# Patient Record
Sex: Male | Born: 1955
Health system: Southern US, Community
[De-identification: ages and names within clinical notes are randomized; demographics above are authoritative.]

## PROBLEM LIST (undated history)

## (undated) DIAGNOSIS — F329 Major depressive disorder, single episode, unspecified: Secondary | ICD-10-CM

## (undated) DIAGNOSIS — E7800 Pure hypercholesterolemia, unspecified: Secondary | ICD-10-CM

## (undated) DIAGNOSIS — F32A Depression, unspecified: Secondary | ICD-10-CM

## (undated) DIAGNOSIS — G4730 Sleep apnea, unspecified: Secondary | ICD-10-CM

## (undated) DIAGNOSIS — N189 Chronic kidney disease, unspecified: Secondary | ICD-10-CM

## (undated) DIAGNOSIS — E119 Type 2 diabetes mellitus without complications: Secondary | ICD-10-CM

## (undated) DIAGNOSIS — C439 Malignant melanoma of skin, unspecified: Secondary | ICD-10-CM

## (undated) DIAGNOSIS — K219 Gastro-esophageal reflux disease without esophagitis: Secondary | ICD-10-CM

## (undated) DIAGNOSIS — I10 Essential (primary) hypertension: Secondary | ICD-10-CM

## (undated) DIAGNOSIS — F419 Anxiety disorder, unspecified: Secondary | ICD-10-CM

## (undated) DIAGNOSIS — Z5189 Encounter for other specified aftercare: Secondary | ICD-10-CM

## (undated) DIAGNOSIS — F319 Bipolar disorder, unspecified: Secondary | ICD-10-CM

## (undated) HISTORY — DX: Depression, unspecified: F32.A

## (undated) HISTORY — DX: Encounter for other specified aftercare: Z51.89

## (undated) HISTORY — DX: Type 2 diabetes mellitus without complications: E11.9

## (undated) HISTORY — PX: SPLENECTOMY: SUR1306

## (undated) HISTORY — DX: Major depressive disorder, single episode, unspecified: F32.9

## (undated) HISTORY — DX: Sleep apnea, unspecified: G47.30

## (undated) HISTORY — DX: Gastro-esophageal reflux disease without esophagitis: K21.9

## (undated) HISTORY — DX: Malignant melanoma of skin, unspecified: C43.9

## (undated) HISTORY — PX: OTHER SURGICAL HISTORY: SHX169

## (undated) HISTORY — DX: Chronic kidney disease, unspecified: N18.9

---

## 1998-01-17 ENCOUNTER — Encounter: Payer: Self-pay | Admitting: Orthopedic Surgery

## 1998-01-17 ENCOUNTER — Ambulatory Visit (HOSPITAL_COMMUNITY): Admission: RE | Admit: 1998-01-17 | Discharge: 1998-01-17 | Payer: Self-pay | Admitting: Orthopedic Surgery

## 1998-07-27 ENCOUNTER — Ambulatory Visit (HOSPITAL_COMMUNITY): Admission: RE | Admit: 1998-07-27 | Discharge: 1998-07-27 | Payer: Self-pay | Admitting: Internal Medicine

## 1998-07-27 ENCOUNTER — Encounter: Payer: Self-pay | Admitting: Internal Medicine

## 1998-12-12 ENCOUNTER — Encounter: Payer: Self-pay | Admitting: *Deleted

## 1998-12-12 ENCOUNTER — Emergency Department (HOSPITAL_COMMUNITY): Admission: EM | Admit: 1998-12-12 | Discharge: 1998-12-12 | Payer: Self-pay | Admitting: *Deleted

## 2000-02-26 ENCOUNTER — Encounter: Payer: Self-pay | Admitting: Internal Medicine

## 2000-02-26 ENCOUNTER — Ambulatory Visit (HOSPITAL_COMMUNITY): Admission: RE | Admit: 2000-02-26 | Discharge: 2000-02-26 | Payer: Self-pay | Admitting: Internal Medicine

## 2001-03-02 ENCOUNTER — Encounter: Payer: Self-pay | Admitting: Internal Medicine

## 2001-03-02 ENCOUNTER — Ambulatory Visit (HOSPITAL_COMMUNITY): Admission: RE | Admit: 2001-03-02 | Discharge: 2001-03-02 | Payer: Self-pay | Admitting: Internal Medicine

## 2002-03-03 ENCOUNTER — Encounter: Payer: Self-pay | Admitting: Internal Medicine

## 2002-03-03 ENCOUNTER — Ambulatory Visit (HOSPITAL_COMMUNITY): Admission: RE | Admit: 2002-03-03 | Discharge: 2002-03-03 | Payer: Self-pay | Admitting: Internal Medicine

## 2003-11-07 ENCOUNTER — Emergency Department (HOSPITAL_COMMUNITY): Admission: EM | Admit: 2003-11-07 | Discharge: 2003-11-07 | Payer: Self-pay | Admitting: Emergency Medicine

## 2004-03-28 ENCOUNTER — Ambulatory Visit (HOSPITAL_COMMUNITY): Admission: RE | Admit: 2004-03-28 | Discharge: 2004-03-28 | Payer: Self-pay | Admitting: Internal Medicine

## 2004-05-09 ENCOUNTER — Ambulatory Visit: Payer: Self-pay | Admitting: Internal Medicine

## 2004-12-05 ENCOUNTER — Emergency Department (HOSPITAL_COMMUNITY): Admission: EM | Admit: 2004-12-05 | Discharge: 2004-12-05 | Payer: Self-pay | Admitting: Emergency Medicine

## 2005-03-20 ENCOUNTER — Emergency Department (HOSPITAL_COMMUNITY): Admission: EM | Admit: 2005-03-20 | Discharge: 2005-03-20 | Payer: Self-pay | Admitting: Emergency Medicine

## 2007-07-02 ENCOUNTER — Ambulatory Visit (HOSPITAL_COMMUNITY): Admission: RE | Admit: 2007-07-02 | Discharge: 2007-07-02 | Payer: Self-pay | Admitting: Internal Medicine

## 2007-10-12 ENCOUNTER — Encounter: Admission: RE | Admit: 2007-10-12 | Discharge: 2007-10-12 | Payer: Self-pay | Admitting: Internal Medicine

## 2009-06-18 ENCOUNTER — Emergency Department (HOSPITAL_COMMUNITY): Admission: EM | Admit: 2009-06-18 | Discharge: 2009-06-19 | Payer: Self-pay | Admitting: Emergency Medicine

## 2009-06-19 ENCOUNTER — Inpatient Hospital Stay (HOSPITAL_COMMUNITY): Admission: AD | Admit: 2009-06-19 | Discharge: 2009-06-27 | Payer: Self-pay | Admitting: Psychiatry

## 2009-06-19 ENCOUNTER — Ambulatory Visit: Payer: Self-pay | Admitting: Psychiatry

## 2009-07-18 ENCOUNTER — Emergency Department (HOSPITAL_COMMUNITY): Admission: EM | Admit: 2009-07-18 | Discharge: 2009-07-18 | Payer: Self-pay | Admitting: Emergency Medicine

## 2009-09-05 ENCOUNTER — Emergency Department (HOSPITAL_COMMUNITY): Admission: EM | Admit: 2009-09-05 | Discharge: 2009-09-05 | Payer: Self-pay | Admitting: Emergency Medicine

## 2009-09-05 ENCOUNTER — Ambulatory Visit: Payer: Self-pay | Admitting: Psychiatry

## 2009-09-05 ENCOUNTER — Inpatient Hospital Stay (HOSPITAL_COMMUNITY): Admission: AD | Admit: 2009-09-05 | Discharge: 2009-09-08 | Payer: Self-pay | Admitting: Psychiatry

## 2009-09-30 ENCOUNTER — Emergency Department (HOSPITAL_COMMUNITY): Admission: EM | Admit: 2009-09-30 | Discharge: 2009-09-30 | Payer: Self-pay | Admitting: Emergency Medicine

## 2009-10-04 ENCOUNTER — Emergency Department (HOSPITAL_COMMUNITY): Admission: EM | Admit: 2009-10-04 | Discharge: 2009-10-04 | Payer: Self-pay | Admitting: Emergency Medicine

## 2009-11-21 ENCOUNTER — Emergency Department (HOSPITAL_COMMUNITY): Admission: EM | Admit: 2009-11-21 | Discharge: 2009-11-21 | Payer: Self-pay | Admitting: Emergency Medicine

## 2010-06-15 LAB — URINALYSIS, ROUTINE W REFLEX MICROSCOPIC
Bilirubin Urine: NEGATIVE
Glucose, UA: NEGATIVE mg/dL
Hgb urine dipstick: NEGATIVE
Ketones, ur: NEGATIVE mg/dL
Nitrite: NEGATIVE
Protein, ur: NEGATIVE mg/dL
Specific Gravity, Urine: 1.008 (ref 1.005–1.030)
Urobilinogen, UA: 1 mg/dL (ref 0.0–1.0)
pH: 6.5 (ref 5.0–8.0)

## 2010-06-15 LAB — BASIC METABOLIC PANEL
BUN: 9 mg/dL (ref 6–23)
CO2: 28 mEq/L (ref 19–32)
Calcium: 9.1 mg/dL (ref 8.4–10.5)
Chloride: 108 mEq/L (ref 96–112)
Creatinine, Ser: 1.2 mg/dL (ref 0.4–1.5)
GFR calc Af Amer: >60 mL/min (ref >60)
GFR calc non Af Amer: >60 mL/min (ref >60)
Glucose, Bld: 86 mg/dL (ref 70–99)
Potassium: 3.6 mEq/L (ref 3.5–5.1)
Sodium: 142 mEq/L (ref 135–145)

## 2010-06-15 LAB — DIFFERENTIAL
Basophils Absolute: 0.1 10*3/uL (ref 0.0–0.1)
Basophils Relative: 1 % (ref 0–1)
Eosinophils Absolute: 0.2 10*3/uL (ref 0.0–0.7)
Eosinophils Relative: 2 % (ref 0–5)
Lymphocytes Relative: 45 % (ref 12–46)
Lymphs Abs: 3.9 10*3/uL (ref 0.7–4.0)
Monocytes Absolute: 1.1 10*3/uL — ABNORMAL HIGH (ref 0.1–1.0)
Monocytes Relative: 13 % — ABNORMAL HIGH (ref 3–12)
Neutro Abs: 3.5 10*3/uL (ref 1.7–7.7)
Neutrophils Relative %: 40 % — ABNORMAL LOW (ref 43–77)

## 2010-06-15 LAB — RAPID URINE DRUG SCREEN, HOSP PERFORMED
Amphetamines: NOT DETECTED
Barbiturates: NOT DETECTED
Benzodiazepines: POSITIVE — AB
Cocaine: NOT DETECTED
Opiates: NOT DETECTED
Tetrahydrocannabinol: NOT DETECTED

## 2010-06-15 LAB — CBC
HCT: 39.5 % (ref 39.0–52.0)
Hemoglobin: 13.8 g/dL (ref 13.0–17.0)
MCH: 34.3 pg — ABNORMAL HIGH (ref 26.0–34.0)
MCHC: 34.9 g/dL (ref 30.0–36.0)
MCV: 98.3 fL (ref 78.0–100.0)
Platelets: 385 10*3/uL (ref 150–400)
RBC: 4.02 MIL/uL — ABNORMAL LOW (ref 4.22–5.81)
RDW: 13 % (ref 11.5–15.5)
WBC: 8.8 10*3/uL (ref 4.0–10.5)

## 2010-06-15 LAB — ETHANOL: Alcohol, Ethyl (B): <5 mg/dL (ref 0–10)

## 2010-06-17 LAB — POCT I-STAT, CHEM 8
BUN: 10 mg/dL (ref 6–23)
Calcium, Ion: 1.16 mmol/L (ref 1.12–1.32)
Chloride: 102 mEq/L (ref 96–112)
Creatinine, Ser: 0.9 mg/dL (ref 0.4–1.5)
Glucose, Bld: 169 mg/dL — ABNORMAL HIGH (ref 70–99)
HCT: 44 % (ref 39.0–52.0)
Hemoglobin: 15 g/dL (ref 13.0–17.0)
Potassium: 3.7 mEq/L (ref 3.5–5.1)
Sodium: 141 mEq/L (ref 135–145)
TCO2: 29 mmol/L (ref 0–100)

## 2010-06-17 LAB — COMPREHENSIVE METABOLIC PANEL
ALT: 17 U/L (ref 0–53)
AST: 25 U/L (ref 0–37)
Albumin: 3.4 g/dL — ABNORMAL LOW (ref 3.5–5.2)
Alkaline Phosphatase: 53 U/L (ref 39–117)
BUN: 12 mg/dL (ref 6–23)
CO2: 28 mEq/L (ref 19–32)
Calcium: 8.4 mg/dL (ref 8.4–10.5)
Chloride: 100 mEq/L (ref 96–112)
Creatinine, Ser: 1.1 mg/dL (ref 0.4–1.5)
GFR calc Af Amer: >60 mL/min (ref >60)
GFR calc non Af Amer: >60 mL/min (ref >60)
Glucose, Bld: 163 mg/dL — ABNORMAL HIGH (ref 70–99)
Potassium: 2.7 mEq/L — CL (ref 3.5–5.1)
Sodium: 138 mEq/L (ref 135–145)
Total Bilirubin: 0.3 mg/dL (ref 0.3–1.2)
Total Protein: 6.4 g/dL (ref 6.0–8.3)

## 2010-06-17 LAB — CBC
HCT: 41.7 % (ref 39.0–52.0)
Hemoglobin: 14.4 g/dL (ref 13.0–17.0)
MCH: 35 pg — ABNORMAL HIGH (ref 26.0–34.0)
MCHC: 34.6 g/dL (ref 30.0–36.0)
MCV: 101.4 fL — ABNORMAL HIGH (ref 78.0–100.0)
Platelets: 303 10*3/uL (ref 150–400)
RBC: 4.12 MIL/uL — ABNORMAL LOW (ref 4.22–5.81)
RDW: 13.5 % (ref 11.5–15.5)
WBC: 10.9 10*3/uL — ABNORMAL HIGH (ref 4.0–10.5)

## 2010-06-17 LAB — DIFFERENTIAL
Basophils Absolute: 0.2 10*3/uL — ABNORMAL HIGH (ref 0.0–0.1)
Basophils Relative: 2 % — ABNORMAL HIGH (ref 0–1)
Eosinophils Absolute: 0.6 10*3/uL (ref 0.0–0.7)
Eosinophils Relative: 5 % (ref 0–5)
Lymphocytes Relative: 40 % (ref 12–46)
Lymphs Abs: 4.3 10*3/uL — ABNORMAL HIGH (ref 0.7–4.0)
Monocytes Absolute: 1.8 10*3/uL — ABNORMAL HIGH (ref 0.1–1.0)
Monocytes Relative: 17 % — ABNORMAL HIGH (ref 3–12)
Neutro Abs: 4 10*3/uL (ref 1.7–7.7)
Neutrophils Relative %: 36 % — ABNORMAL LOW (ref 43–77)

## 2010-06-17 LAB — URINALYSIS, ROUTINE W REFLEX MICROSCOPIC
Glucose, UA: NEGATIVE mg/dL
Hgb urine dipstick: NEGATIVE
Ketones, ur: 15 mg/dL — AB
Nitrite: NEGATIVE
Protein, ur: NEGATIVE mg/dL
Specific Gravity, Urine: 1.022 (ref 1.005–1.030)
Urobilinogen, UA: 1 mg/dL (ref 0.0–1.0)
pH: 5.5 (ref 5.0–8.0)

## 2010-06-18 LAB — COMPREHENSIVE METABOLIC PANEL
ALT: 24 U/L (ref 0–53)
ALT: 27 U/L (ref 0–53)
AST: 22 U/L (ref 0–37)
AST: 25 U/L (ref 0–37)
Albumin: 3.9 g/dL (ref 3.5–5.2)
Albumin: 4.2 g/dL (ref 3.5–5.2)
Alkaline Phosphatase: 50 U/L (ref 39–117)
Alkaline Phosphatase: 51 U/L (ref 39–117)
BUN: 10 mg/dL (ref 6–23)
BUN: 11 mg/dL (ref 6–23)
CO2: 27 mEq/L (ref 19–32)
CO2: 29 mEq/L (ref 19–32)
Calcium: 9.2 mg/dL (ref 8.4–10.5)
Calcium: 9.2 mg/dL (ref 8.4–10.5)
Chloride: 105 mEq/L (ref 96–112)
Chloride: 106 mEq/L (ref 96–112)
Creatinine, Ser: 1.06 mg/dL (ref 0.4–1.5)
Creatinine, Ser: 1.09 mg/dL (ref 0.4–1.5)
GFR calc Af Amer: >60 mL/min (ref >60)
GFR calc Af Amer: >60 mL/min (ref >60)
GFR calc non Af Amer: >60 mL/min (ref >60)
GFR calc non Af Amer: >60 mL/min (ref >60)
Glucose, Bld: 114 mg/dL — ABNORMAL HIGH (ref 70–99)
Glucose, Bld: 123 mg/dL — ABNORMAL HIGH (ref 70–99)
Potassium: 3.3 mEq/L — ABNORMAL LOW (ref 3.5–5.1)
Potassium: 3.9 mEq/L (ref 3.5–5.1)
Sodium: 140 mEq/L (ref 135–145)
Sodium: 143 mEq/L (ref 135–145)
Total Bilirubin: 0.7 mg/dL (ref 0.3–1.2)
Total Bilirubin: 0.9 mg/dL (ref 0.3–1.2)
Total Protein: 7 g/dL (ref 6.0–8.3)
Total Protein: 7.2 g/dL (ref 6.0–8.3)

## 2010-06-18 LAB — CBC
HCT: 41.3 % (ref 39.0–52.0)
HCT: 45 % (ref 39.0–52.0)
Hemoglobin: 14.3 g/dL (ref 13.0–17.0)
Hemoglobin: 15.2 g/dL (ref 13.0–17.0)
MCHC: 33.8 g/dL (ref 30.0–36.0)
MCHC: 34.7 g/dL (ref 30.0–36.0)
MCV: 100.7 fL — ABNORMAL HIGH (ref 78.0–100.0)
MCV: 99.7 fL (ref 78.0–100.0)
Platelets: 316 10*3/uL (ref 150–400)
Platelets: 355 10*3/uL (ref 150–400)
RBC: 4.14 MIL/uL — ABNORMAL LOW (ref 4.22–5.81)
RBC: 4.47 MIL/uL (ref 4.22–5.81)
RDW: 13.6 % (ref 11.5–15.5)
RDW: 13.7 % (ref 11.5–15.5)
WBC: 11.8 10*3/uL — ABNORMAL HIGH (ref 4.0–10.5)
WBC: 14.1 10*3/uL — ABNORMAL HIGH (ref 4.0–10.5)

## 2010-06-18 LAB — RAPID URINE DRUG SCREEN, HOSP PERFORMED
Amphetamines: NOT DETECTED
Barbiturates: NOT DETECTED
Benzodiazepines: POSITIVE — AB
Cocaine: NOT DETECTED
Opiates: NOT DETECTED
Tetrahydrocannabinol: NOT DETECTED

## 2010-06-18 LAB — BASIC METABOLIC PANEL
BUN: 9 mg/dL (ref 6–23)
CO2: 27 mEq/L (ref 19–32)
Calcium: 8.9 mg/dL (ref 8.4–10.5)
Chloride: 104 mEq/L (ref 96–112)
Creatinine, Ser: 1.09 mg/dL (ref 0.4–1.5)
GFR calc Af Amer: >60 mL/min (ref >60)
GFR calc non Af Amer: >60 mL/min (ref >60)
Glucose, Bld: 159 mg/dL — ABNORMAL HIGH (ref 70–99)
Potassium: 3.5 mEq/L (ref 3.5–5.1)
Sodium: 139 mEq/L (ref 135–145)

## 2010-06-18 LAB — DIFFERENTIAL
Basophils Absolute: 0.1 10*3/uL (ref 0.0–0.1)
Basophils Relative: 1 % (ref 0–1)
Eosinophils Absolute: 0.6 10*3/uL (ref 0.0–0.7)
Eosinophils Relative: 5 % (ref 0–5)
Lymphocytes Relative: 44 % (ref 12–46)
Lymphs Abs: 5.2 10*3/uL — ABNORMAL HIGH (ref 0.7–4.0)
Monocytes Absolute: 1.6 10*3/uL — ABNORMAL HIGH (ref 0.1–1.0)
Monocytes Relative: 13 % — ABNORMAL HIGH (ref 3–12)
Neutro Abs: 4.4 10*3/uL (ref 1.7–7.7)
Neutrophils Relative %: 37 % — ABNORMAL LOW (ref 43–77)

## 2010-06-18 LAB — ETHANOL: Alcohol, Ethyl (B): <5 mg/dL (ref 0–10)

## 2010-06-18 LAB — VALPROIC ACID LEVEL
Valproic Acid Lvl: 13.3 ug/mL — ABNORMAL LOW (ref 50.0–100.0)
Valproic Acid Lvl: 46.8 ug/mL — ABNORMAL LOW (ref 50.0–100.0)

## 2010-06-18 LAB — TRICYCLICS SCREEN, URINE: TCA Scrn: NOT DETECTED

## 2010-06-24 LAB — RAPID URINE DRUG SCREEN, HOSP PERFORMED
Amphetamines: NOT DETECTED
Barbiturates: NOT DETECTED
Benzodiazepines: POSITIVE — AB
Cocaine: NOT DETECTED
Opiates: NOT DETECTED
Tetrahydrocannabinol: NOT DETECTED

## 2010-06-24 LAB — POCT I-STAT, CHEM 8
BUN: 14 mg/dL (ref 6–23)
Calcium, Ion: 1.16 mmol/L (ref 1.12–1.32)
Chloride: 101 meq/L (ref 96–112)
Creatinine, Ser: 1.1 mg/dL (ref 0.4–1.5)
Glucose, Bld: 111 mg/dL — ABNORMAL HIGH (ref 70–99)
HCT: 41 % (ref 39.0–52.0)
Hemoglobin: 13.9 g/dL (ref 13.0–17.0)
Potassium: 3 meq/L — ABNORMAL LOW (ref 3.5–5.1)
Sodium: 141 meq/L (ref 135–145)
TCO2: 32 mmol/L (ref 0–100)

## 2010-06-24 LAB — POTASSIUM: Potassium: 3.4 mEq/L — ABNORMAL LOW (ref 3.5–5.1)

## 2010-06-24 LAB — ETHANOL

## 2010-09-09 ENCOUNTER — Emergency Department (HOSPITAL_COMMUNITY): Payer: BC Managed Care – PPO

## 2010-09-09 ENCOUNTER — Emergency Department (HOSPITAL_COMMUNITY)
Admission: EM | Admit: 2010-09-09 | Discharge: 2010-09-09 | Disposition: A | Discharge status: Home / Self Care | Payer: BC Managed Care – PPO | Attending: Internal Medicine | Admitting: Internal Medicine

## 2010-09-09 DIAGNOSIS — Z79899 Other long term (current) drug therapy: Secondary | ICD-10-CM | POA: Insufficient documentation

## 2010-09-09 DIAGNOSIS — I10 Essential (primary) hypertension: Secondary | ICD-10-CM | POA: Insufficient documentation

## 2010-09-09 DIAGNOSIS — R630 Anorexia: Secondary | ICD-10-CM | POA: Insufficient documentation

## 2010-09-09 DIAGNOSIS — F341 Dysthymic disorder: Secondary | ICD-10-CM | POA: Insufficient documentation

## 2010-09-09 DIAGNOSIS — E785 Hyperlipidemia, unspecified: Secondary | ICD-10-CM | POA: Insufficient documentation

## 2010-09-09 DIAGNOSIS — Z7982 Long term (current) use of aspirin: Secondary | ICD-10-CM | POA: Insufficient documentation

## 2010-09-09 DIAGNOSIS — R634 Abnormal weight loss: Secondary | ICD-10-CM | POA: Insufficient documentation

## 2010-09-09 LAB — CBC
HCT: 39 % (ref 39.0–52.0)
Hemoglobin: 13.5 g/dL (ref 13.0–17.0)
MCH: 32.5 pg (ref 26.0–34.0)
MCHC: 34.6 g/dL (ref 30.0–36.0)
MCV: 94 fL (ref 78.0–100.0)
Platelets: 340 10*3/uL (ref 150–400)
RBC: 4.15 MIL/uL — ABNORMAL LOW (ref 4.22–5.81)
RDW: 12.7 % (ref 11.5–15.5)
WBC: 11.3 10*3/uL — ABNORMAL HIGH (ref 4.0–10.5)

## 2010-09-09 LAB — DIFFERENTIAL
Basophils Absolute: 0.1 10*3/uL (ref 0.0–0.1)
Basophils Relative: 1 % (ref 0–1)
Eosinophils Absolute: 0.3 10*3/uL (ref 0.0–0.7)
Eosinophils Relative: 3 % (ref 0–5)
Lymphocytes Relative: 54 % — ABNORMAL HIGH (ref 12–46)
Lymphs Abs: 6.1 10*3/uL — ABNORMAL HIGH (ref 0.7–4.0)
Monocytes Absolute: 1.5 10*3/uL — ABNORMAL HIGH (ref 0.1–1.0)
Monocytes Relative: 13 % — ABNORMAL HIGH (ref 3–12)
Neutro Abs: 3.3 10*3/uL (ref 1.7–7.7)
Neutrophils Relative %: 30 % — ABNORMAL LOW (ref 43–77)

## 2010-09-09 LAB — POCT I-STAT, CHEM 8
BUN: 20 mg/dL (ref 6–23)
Calcium, Ion: 1.16 mmol/L (ref 1.12–1.32)
Chloride: 104 mEq/L (ref 96–112)
Creatinine, Ser: 1.2 mg/dL (ref 0.4–1.5)
Glucose, Bld: 88 mg/dL (ref 70–99)
HCT: 41 % (ref 39.0–52.0)
Hemoglobin: 13.9 g/dL (ref 13.0–17.0)
Potassium: 3.7 mEq/L (ref 3.5–5.1)
Sodium: 142 mEq/L (ref 135–145)
TCO2: 28 mmol/L (ref 0–100)

## 2010-09-09 MED ORDER — IOHEXOL 300 MG/ML  SOLN: 75.0000 mL | Freq: Once | INTRAMUSCULAR | Status: DC | PRN

## 2010-09-10 LAB — PATHOLOGIST SMEAR REVIEW

## 2011-04-06 ENCOUNTER — Emergency Department (HOSPITAL_COMMUNITY)
Admission: EM | Admit: 2011-04-06 | Discharge: 2011-04-07 | Disposition: A | Discharge status: Home / Self Care | Payer: BC Managed Care – PPO | Attending: Emergency Medicine | Admitting: Emergency Medicine

## 2011-04-06 ENCOUNTER — Encounter: Payer: Self-pay | Admitting: *Deleted

## 2011-04-06 DIAGNOSIS — IMO0002 Reserved for concepts with insufficient information to code with codable children: Secondary | ICD-10-CM | POA: Insufficient documentation

## 2011-04-06 DIAGNOSIS — F3289 Other specified depressive episodes: Secondary | ICD-10-CM | POA: Insufficient documentation

## 2011-04-06 DIAGNOSIS — F329 Major depressive disorder, single episode, unspecified: Secondary | ICD-10-CM | POA: Insufficient documentation

## 2011-04-06 DIAGNOSIS — F32A Depression, unspecified: Secondary | ICD-10-CM

## 2011-04-06 DIAGNOSIS — F411 Generalized anxiety disorder: Secondary | ICD-10-CM | POA: Insufficient documentation

## 2011-04-06 DIAGNOSIS — R443 Hallucinations, unspecified: Secondary | ICD-10-CM | POA: Insufficient documentation

## 2011-04-06 DIAGNOSIS — I10 Essential (primary) hypertension: Secondary | ICD-10-CM | POA: Insufficient documentation

## 2011-04-06 HISTORY — DX: Pure hypercholesterolemia, unspecified: E78.00

## 2011-04-06 HISTORY — DX: Essential (primary) hypertension: I10

## 2011-04-06 HISTORY — DX: Major depressive disorder, single episode, unspecified: F32.9

## 2011-04-06 HISTORY — DX: Depression, unspecified: F32.A

## 2011-04-06 HISTORY — DX: Anxiety disorder, unspecified: F41.9

## 2011-04-06 LAB — ETHANOL: Alcohol, Ethyl (B): <11 mg/dL (ref 0–11)

## 2011-04-06 LAB — CBC
HCT: 44 % (ref 39.0–52.0)
Hemoglobin: 14.8 g/dL (ref 13.0–17.0)
MCH: 32.2 pg (ref 26.0–34.0)
MCHC: 33.6 g/dL (ref 30.0–36.0)
MCV: 95.7 fL (ref 78.0–100.0)
Platelets: 394 10*3/uL (ref 150–400)
RBC: 4.6 MIL/uL (ref 4.22–5.81)
RDW: 12.8 % (ref 11.5–15.5)
WBC: 10.5 10*3/uL (ref 4.0–10.5)

## 2011-04-06 LAB — RAPID URINE DRUG SCREEN, HOSP PERFORMED
Amphetamines: NOT DETECTED
Barbiturates: NOT DETECTED
Benzodiazepines: POSITIVE — AB
Cocaine: NOT DETECTED
Opiates: NOT DETECTED
Tetrahydrocannabinol: NOT DETECTED

## 2011-04-06 LAB — BASIC METABOLIC PANEL
BUN: 7 mg/dL (ref 6–23)
CO2: 27 mEq/L (ref 19–32)
Calcium: 9.7 mg/dL (ref 8.4–10.5)
Chloride: 104 mEq/L (ref 96–112)
Creatinine, Ser: 0.94 mg/dL (ref 0.50–1.35)
GFR calc Af Amer: >90 mL/min (ref >90)
GFR calc non Af Amer: >90 mL/min (ref >90)
Glucose, Bld: 108 mg/dL — ABNORMAL HIGH (ref 70–99)
Potassium: 3.1 mEq/L — ABNORMAL LOW (ref 3.5–5.1)
Sodium: 143 mEq/L (ref 135–145)

## 2011-04-06 MED ORDER — ALUM & MAG HYDROXIDE-SIMETH 200-200-20 MG/5ML PO SUSP: 30.0000 mL | ORAL | Status: DC | PRN

## 2011-04-06 MED ORDER — LORAZEPAM 1 MG PO TABS
1.0000 mg | ORAL_TABLET | Freq: Three times a day (TID) | ORAL | Status: DC | PRN
  Administered 2011-04-06 – 2011-04-07 (×2): 1 mg via ORAL
  Filled 2011-04-06 (×2): qty 1

## 2011-04-06 MED ORDER — FUROSEMIDE 40 MG PO TABS
40.0000 mg | ORAL_TABLET | Freq: Two times a day (BID) | ORAL | Status: DC
  Administered 2011-04-06 – 2011-04-07 (×3): 40 mg via ORAL
  Filled 2011-04-06 (×6): qty 1

## 2011-04-06 MED ORDER — ZOLPIDEM TARTRATE 5 MG PO TABS
5.0000 mg | ORAL_TABLET | Freq: Every evening | ORAL | Status: DC | PRN
  Administered 2011-04-07: 5 mg via ORAL
  Filled 2011-04-06: qty 1

## 2011-04-06 MED ORDER — ACETAMINOPHEN 325 MG PO TABS: 650.0000 mg | ORAL_TABLET | ORAL | Status: DC | PRN

## 2011-04-06 MED ORDER — ONDANSETRON HCL 4 MG PO TABS: 4.0000 mg | ORAL_TABLET | Freq: Three times a day (TID) | ORAL | Status: DC | PRN

## 2011-04-06 MED ORDER — CLORAZEPATE DIPOTASSIUM 7.5 MG PO TABS
7.5000 mg | ORAL_TABLET | Freq: Three times a day (TID) | ORAL | Status: DC
  Administered 2011-04-06 – 2011-04-07 (×4): 7.5 mg via ORAL
  Filled 2011-04-06 (×4): qty 1

## 2011-04-06 MED ORDER — ATENOLOL 50 MG PO TABS
50.0000 mg | ORAL_TABLET | Freq: Every day | ORAL | Status: DC
  Administered 2011-04-06 – 2011-04-07 (×2): 50 mg via ORAL
  Filled 2011-04-06 (×3): qty 1

## 2011-04-06 MED ORDER — IBUPROFEN 600 MG PO TABS: 600.0000 mg | ORAL_TABLET | Freq: Three times a day (TID) | ORAL | Status: DC | PRN

## 2011-04-06 NOTE — ED Notes (Signed)
Patient reports depression, paranoia, anxiety, increased stress, fleeting suicidal thoughts but no plan and has threatened other people.

## 2011-04-06 NOTE — ED Notes (Signed)
Pt. Eating from dinner tray---Continues to appear less anxious

## 2011-04-06 NOTE — ED Notes (Signed)
Wife at bedside talking with patient--Remains calm and talkative.  Ate 100% from dinner tray and has consumed several snacks.
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
## 2011-04-06 NOTE — ED Notes (Signed)
Pt. Appears less anxious--states he does think the Ativan tablet did decrease his nervousness.  Eating sandwich and snack--appears to be in no distress.

## 2011-04-06 NOTE — ED Notes (Signed)
Report received---Pt. Transported to TCU Rm 29 in direct view of nurses' station--Pt. Very talkative---actually talking continuously about various subjects including his mother and her mental illness, states he has not been sleeping well and has been unable to focus for the past few weeks.  Allowed pt. To vent his concerns.

## 2011-04-06 NOTE — ED Notes (Signed)
Security called to wand pt and pt's personal belongings. 

## 2011-04-06 NOTE — ED Provider Notes (Signed)
Pt presents to the hospital with the chief complaint of depression. Pt has been suffering from depression for 35 years, however recently it has gotten much worse & it is interfering with pts daily living. Pt has has SI, but denies HI. Pt denies any cardiac &respiratory s/s including CP, SOB, DOE, cough, leg swelling, or palpitations. Pt also denies urinary s/s of hematuria, dysuria, frequency and urgency. Pt currently has no medical complaints is hemodynamically stable and does not appear to be in acute distress  Physical Exam  Constitutional: He is oriented to person, place, and time. He appears well-developed and well-nourished. No distress.  Eyes: Conjunctivae and EOM are normal. Pupils are equal, round, and reactive to light.  Neck: Normal range of motion. Neck supple.  Cardiovascular: Normal rate, regular rhythm and intact distal pulses.   Pulmonary/Chest: Effort normal and breath sounds normal.  Musculoskeletal: Normal range of motion.  Neurological: He is alert and oriented to person, place, and time.  Skin: Skin is warm and dry. He is not diaphoretic.  Psychiatric: His mood appears anxious. His affect is not angry. He is agitated and actively hallucinating (states occasional visual hallucinations). Thought content is paranoid. Thought content is not delusional. Cognition and memory are not impaired. He exhibits a depressed mood. He expresses suicidal ideation. He expresses no homicidal (wife states she gets scared he is going to asttack her at times) ideation. He expresses no suicidal plans and no homicidal plans.       Wife states pt has worsened recently, talks to himself, ruminates, & constantly counts things.     Pt has been examined and medically cleared to move to [facility]. ACT has been paged for consult, holding orders placed, & necessary labs ordered.     [place], [place] 04/06/11 1529

## 2011-04-06 NOTE — ED Notes (Signed)
Pt has visitor at bedside. Visitor belongings left at desk

## 2011-04-06 NOTE — ED Notes (Signed)
Report called to Olegario Messier, RN in [place], pt assisted to rm 29 with chart and personal belongings, condition stable at this time.

## 2011-04-07 MED ORDER — RISPERIDONE 0.5 MG PO TABS: 0.5000 mg | ORAL_TABLET | Freq: Two times a day (BID) | ORAL | Status: DC

## 2011-04-07 MED ORDER — ZOLPIDEM TARTRATE 5 MG PO TABS: 5.0000 mg | ORAL_TABLET | Freq: Every evening | ORAL | Status: DC | PRN

## 2011-04-07 NOTE — ED Notes (Signed)
Dc instructions reviews w/ pt and wife.  Phone numbers for local psyciatrisit that the pt is familiar with given to pt.  Pt encouraged to take the medications as directed and follow up as soon as possible w/ the psychiatrist.  Pt encouraged to return for as difficulties, return of suicidal thoughts/urges.  Pt verbalized understanding

## 2011-04-07 NOTE — ED Provider Notes (Signed)
History     CSN: [id number]  Arrival date & time 04/06/11  1349   First MD Initiated Contact with Patient 04/06/11 1504      Chief Complaint  Patient presents with  . V70.1    (Consider location/radiation/quality/duration/timing/severity/associated sxs/prior treatment) HPI  Past Medical History  Diagnosis Date  . Hypertension   . Anxiety and depression   . Hypercholesterolemia     Past Surgical History  Procedure Date  . L hand surgery   . Splenectomy   . R knee arthoroscopy     Family History  Problem Relation Age of Onset  . Cancer Mother     History  Substance Use Topics  . Smoking status: Former Smoker    Quit date: 04/05/2001  . Smokeless tobacco: Never Used  . Alcohol Use: No      Review of Systems  Allergies  Review of patient's allergies indicates no known allergies.  Home Medications   Current Outpatient Rx  Name Route Sig Dispense Refill  . ATENOLOL 50 MG PO TABS Oral Take 50 mg by mouth daily.      Marland Kitchen CLORAZEPATE DIPOTASSIUM 7.5 MG PO TABS Oral Take 7.5 mg by mouth 3 (three) times daily.      . FUROSEMIDE 40 MG PO TABS Oral Take 40 mg by mouth 2 (two) times daily.      . IBUPROFEN 200 MG PO TABS Oral Take 400 mg by mouth every 8 (eight) hours as needed. For pain.     . ADULT MULTIVITAMIN W/MINERALS CH Oral Take 1 tablet by mouth daily.      Marland Kitchen VISINE OP Ophthalmic Apply 1 drop to eye daily as needed. For dryness.     Marland Kitchen RISPERIDONE 0.5 MG PO TABS Oral Take 1 tablet (0.5 mg total) by mouth 2 (two) times daily. 40 tablet 0  . ZOLPIDEM TARTRATE 5 MG PO TABS Oral Take 1 tablet (5 mg total) by mouth at bedtime as needed for sleep. 15 tablet 0    BP 121/68  Pulse 59  Temp(Src) 98.3 F (36.8 C) (Oral)  Resp 18  SpO2 94%  Physical Exam  ED Course  Procedures (including critical care time)  Labs Reviewed  URINE RAPID DRUG SCREEN (HOSP PERFORMED) - Abnormal; Notable for the following:    Benzodiazepines POSITIVE (*)    All other components  within normal limits  BASIC METABOLIC PANEL - Abnormal; Notable for the following:    Potassium 3.1 (*)    Glucose, Bld 108 (*)    All other components within normal limits  CBC  ETHANOL   No results found.   1. Depression       MDM  tele psych consult completed.  No homicidal or suicidal ideation. Psychiatrist thinks patient can not home.  Discharge home with Risperdal and Ambien        Donnetta Hutching, MD 04/07/11 1752

## 2011-04-07 NOTE — ED Provider Notes (Signed)
Medical screening examination/treatment/procedure(s) were performed by non-physician practitioner and as supervising physician I was immediately available for consultation/collaboration.  Jeffrey P Caporossi, MD 04/07/11 0025 

## 2011-04-07 NOTE — ED Notes (Signed)
Up to the bathroom 

## 2011-04-07 NOTE — ED Notes (Signed)
Belongings returned

## 2011-04-07 NOTE — ED Notes (Addendum)
telepsych in progress, wife at bedside

## 2011-04-07 NOTE — ED Notes (Signed)
Wife at bedside, telepsych procedure explained to both

## 2011-04-07 NOTE — ED Notes (Signed)
telepsych complete 

## 2011-04-07 NOTE — ED Notes (Signed)
Information faxed to telepsych

## 2011-09-11 ENCOUNTER — Encounter (HOSPITAL_COMMUNITY): Payer: Self-pay | Admitting: *Deleted

## 2011-09-11 ENCOUNTER — Emergency Department (HOSPITAL_COMMUNITY)
Admission: EM | Admit: 2011-09-11 | Discharge: 2011-09-11 | Disposition: A | Discharge status: Home / Self Care | Payer: BC Managed Care – PPO | Attending: Emergency Medicine | Admitting: Emergency Medicine

## 2011-09-11 DIAGNOSIS — R609 Edema, unspecified: Secondary | ICD-10-CM | POA: Insufficient documentation

## 2011-09-11 DIAGNOSIS — F329 Major depressive disorder, single episode, unspecified: Secondary | ICD-10-CM | POA: Insufficient documentation

## 2011-09-11 DIAGNOSIS — Z79899 Other long term (current) drug therapy: Secondary | ICD-10-CM | POA: Insufficient documentation

## 2011-09-11 DIAGNOSIS — F3289 Other specified depressive episodes: Secondary | ICD-10-CM | POA: Insufficient documentation

## 2011-09-11 DIAGNOSIS — IMO0002 Reserved for concepts with insufficient information to code with codable children: Secondary | ICD-10-CM | POA: Insufficient documentation

## 2011-09-11 DIAGNOSIS — I10 Essential (primary) hypertension: Secondary | ICD-10-CM | POA: Insufficient documentation

## 2011-09-11 DIAGNOSIS — F32A Depression, unspecified: Secondary | ICD-10-CM

## 2011-09-11 DIAGNOSIS — R45851 Suicidal ideations: Secondary | ICD-10-CM

## 2011-09-11 HISTORY — DX: Bipolar disorder, unspecified: F31.9

## 2011-09-11 LAB — CBC
HCT: 48.1 % (ref 39.0–52.0)
Hemoglobin: 15.7 g/dL (ref 13.0–17.0)
MCH: 32 pg (ref 26.0–34.0)
MCHC: 32.6 g/dL (ref 30.0–36.0)
MCV: 98.2 fL (ref 78.0–100.0)
Platelets: 453 10*3/uL — ABNORMAL HIGH (ref 150–400)
RBC: 4.9 MIL/uL (ref 4.22–5.81)
RDW: 13.3 % (ref 11.5–15.5)
WBC: 11.2 10*3/uL — ABNORMAL HIGH (ref 4.0–10.5)

## 2011-09-11 LAB — COMPREHENSIVE METABOLIC PANEL
ALT: 17 U/L (ref 0–53)
AST: 14 U/L (ref 0–37)
Albumin: 4 g/dL (ref 3.5–5.2)
Alkaline Phosphatase: 80 U/L (ref 39–117)
BUN: 16 mg/dL (ref 6–23)
CO2: 25 mEq/L (ref 19–32)
Calcium: 9.4 mg/dL (ref 8.4–10.5)
Chloride: 100 mEq/L (ref 96–112)
Creatinine, Ser: 1.08 mg/dL (ref 0.50–1.35)
GFR calc Af Amer: 87 mL/min — ABNORMAL LOW (ref >90)
GFR calc non Af Amer: 75 mL/min — ABNORMAL LOW (ref >90)
Glucose, Bld: 102 mg/dL — ABNORMAL HIGH (ref 70–99)
Potassium: 3.5 mEq/L (ref 3.5–5.1)
Sodium: 138 mEq/L (ref 135–145)
Total Bilirubin: 0.5 mg/dL (ref 0.3–1.2)
Total Protein: 7.7 g/dL (ref 6.0–8.3)

## 2011-09-11 LAB — RAPID URINE DRUG SCREEN, HOSP PERFORMED
Amphetamines: NOT DETECTED
Barbiturates: NOT DETECTED
Benzodiazepines: POSITIVE — AB
Cocaine: NOT DETECTED
Opiates: NOT DETECTED
Tetrahydrocannabinol: NOT DETECTED

## 2011-09-11 LAB — ETHANOL: Alcohol, Ethyl (B): <11 mg/dL (ref 0–11)

## 2011-09-11 MED ORDER — ALUM & MAG HYDROXIDE-SIMETH 200-200-20 MG/5ML PO SUSP: 30.0000 mL | ORAL | Status: DC | PRN

## 2011-09-11 MED ORDER — ATENOLOL 50 MG PO TABS
50.0000 mg | ORAL_TABLET | Freq: Every day | ORAL | Status: DC
  Administered 2011-09-11: 50 mg via ORAL
  Filled 2011-09-11: qty 1

## 2011-09-11 MED ORDER — VITAMIN D3 25 MCG (1000 UNIT) PO TABS
1000.0000 [IU] | ORAL_TABLET | Freq: Every day | ORAL | Status: DC
  Administered 2011-09-11: 1000 [IU] via ORAL
  Filled 2011-09-11: qty 1

## 2011-09-11 MED ORDER — FUROSEMIDE 40 MG PO TABS
40.0000 mg | ORAL_TABLET | Freq: Two times a day (BID) | ORAL | Status: DC
Start: 2011-09-11 — End: 2011-09-11
  Administered 2011-09-11: 40 mg via ORAL
  Filled 2011-09-11 (×3): qty 1

## 2011-09-11 MED ORDER — CLORAZEPATE DIPOTASSIUM 7.5 MG PO TABS
7.5000 mg | ORAL_TABLET | Freq: Three times a day (TID) | ORAL | Status: DC
  Administered 2011-09-11 (×2): 7.5 mg via ORAL
  Filled 2011-09-11 (×2): qty 1

## 2011-09-11 MED ORDER — ASPIRIN 81 MG PO CHEW
81.0000 mg | CHEWABLE_TABLET | Freq: Every day | ORAL | Status: DC
  Administered 2011-09-11: 81 mg via ORAL
  Filled 2011-09-11: qty 1

## 2011-09-11 MED ORDER — POTASSIUM CHLORIDE CRYS ER 20 MEQ PO TBCR
20.0000 meq | EXTENDED_RELEASE_TABLET | Freq: Two times a day (BID) | ORAL | Status: DC
  Administered 2011-09-11: 20 meq via ORAL
  Filled 2011-09-11: qty 1

## 2011-09-11 MED ORDER — ACETAMINOPHEN 325 MG PO TABS: 650.0000 mg | ORAL_TABLET | ORAL | Status: DC | PRN

## 2011-09-11 MED ORDER — ADULT MULTIVITAMIN W/MINERALS CH
1.0000 | ORAL_TABLET | Freq: Every day | ORAL | Status: DC
  Administered 2011-09-11: 1 via ORAL
  Filled 2011-09-11: qty 1

## 2011-09-11 MED ORDER — SIMVASTATIN 20 MG PO TABS
20.0000 mg | ORAL_TABLET | Freq: Every day | ORAL | Status: DC
  Administered 2011-09-11: 20 mg via ORAL
  Filled 2011-09-11: qty 1

## 2011-09-11 MED ORDER — SERTRALINE HCL 25 MG PO TABS: 25.0000 mg | ORAL_TABLET | Freq: Every day | ORAL | Status: DC

## 2011-09-11 MED ORDER — LORAZEPAM 1 MG PO TABS: 1.0000 mg | ORAL_TABLET | Freq: Three times a day (TID) | ORAL | Status: DC | PRN

## 2011-09-11 MED ORDER — RISPERIDONE 0.5 MG PO TABS
0.5000 mg | ORAL_TABLET | Freq: Two times a day (BID) | ORAL | Status: DC
  Administered 2011-09-11: 0.5 mg via ORAL
  Filled 2011-09-11: qty 1

## 2011-09-11 MED ORDER — QUETIAPINE FUMARATE 200 MG PO TABS: 200.0000 mg | ORAL_TABLET | Freq: Every day | ORAL | Status: DC

## 2011-09-11 MED ORDER — ZOLPIDEM TARTRATE 5 MG PO TABS: 5.0000 mg | ORAL_TABLET | Freq: Every evening | ORAL | Status: DC | PRN

## 2011-09-11 MED ORDER — IBUPROFEN 200 MG PO TABS: 600.0000 mg | ORAL_TABLET | Freq: Three times a day (TID) | ORAL | Status: DC | PRN

## 2011-09-11 NOTE — ED Notes (Signed)
Pt c/o severe depression; feeling suicidal with different plans; increased stress; can't focus or concentrate; increased in last month; swelling noted bilateral lower legs x 1 month; feels like mind going to explode

## 2011-09-11 NOTE — Clinical Social Work Psych Note (Signed)
Patient has been assessed. Pending telepsych.  Manson Passey Uzoma Vivona ANN S , MSW, LCSWA 09/11/2011 9:34 AM [phone]

## 2011-09-11 NOTE — ED Notes (Signed)
Pt verbalized hopelessness, states he wants to kill self due to a lot of "stressors" like "my finances and health".  Has mentioned several plans to kill self--- jumping in front of a moving truck, hang self in a tree, shooting himself in the head with a gun--- states he has a gun at home. Pt also reports unable to get enough sleep because of depression--- pt states that he takes Tranxene,  Risperdal and Depakote regularly.

## 2011-09-11 NOTE — ED Notes (Signed)
Pt wanded by security and clothing/belongings removed in triage

## 2011-09-11 NOTE — ED Notes (Signed)
Personal belongings placed in plastic bag and locked in cabinet ([id number]).

## 2011-09-11 NOTE — ED Notes (Signed)
Telepsych in progress. 

## 2011-09-11 NOTE — ED Notes (Signed)
Security called to help escort patient to waiting room due to patient refusing to leave after being told 20 minutes earlier. Patient stated "I need to stay here until my ride comes to get me at 2 am". Patient told that he will not be able to stay in the room for that amount of time. Patient given bus pass and instructions on how to ride the bus home. Patient states "that he does not want to ride the bus and will wait in the waiting room" Patient still dressed in blue scrubs and red socks with belongings at side in waiting room because patient refused to change into clothes.
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
## 2011-09-11 NOTE — ED Notes (Signed)
Patient wants to drink coffee then take shower. Materials at bedside for shower.

## 2011-09-11 NOTE — Discharge Instructions (Signed)
Depression You have signs of depression. This is a common problem. It can occur at any age. It is often hard to recognize. People can suffer from depression and still have moments of enjoyment. Depression interferes with your basic ability to function in life. It upsets your relationships, sleep, eating, and work habits. CAUSES  Depression is believed to be caused by an imbalance in brain chemicals. It may be triggered by an unpleasant event. Relationship crises, a death in the family, financial worries, retirement, or other stressors are normal causes of depression. Depression may also start for no known reason. Other factors that may play a part include medical illnesses, some medicines, genetics, and alcohol or drug abuse. SYMPTOMS   Feeling unhappy or worthless.   Long-lasting (chronic) tiredness or worn-out feeling.   Self-destructive thoughts and actions.   Not being able to sleep or sleeping too much.   Eating more than usual or not eating at all.   Headaches or feeling anxious.   Trouble concentrating or making decisions.   Unexplained physical problems and substance abuse.  TREATMENT  Depression usually gets better with treatment. This can include:  Antidepressant medicines. It can take weeks before the proper dose is achieved and benefits are reached.   Talking with a therapist, clergyperson, counselor, or friend. These people can help you gain insight into your problem and regain control of your life.   Eating a good diet.   Getting regular physical exercise, such as walking for 30 minutes every day.   Not abusing alcohol or drugs.  Treating depression often takes 6 months or longer. This length of treatment is needed to keep symptoms from returning. Call your caregiver and arrange for follow-up care as suggested. SEEK IMMEDIATE MEDICAL CARE IF:   You start to have thoughts of hurting yourself or others.   Call your local emergency services (911 in [place]   Go to  your local medical emergency department.   Call the National Suicide Prevention Lifeline: 1-800-273-TALK 317-021-8915).  Document Released: 03/18/2005 Document Revised: 03/07/2011 Document Reviewed: 08/18/2009 [facility] Patient Information 2012 [place], [place].Suicidal Feelings, How to Help Yourself Everyone feels sad or unhappy at times, but depressing thoughts and feelings of hopelessness can lead to thoughts of suicide. It can seem as if life is too tough to handle. It is as if the mountain is just too high and your climbing skills are not great enough. At that moment these dark thoughts and feelings may seem overwhelming and never ending. It is important to remember these feelings are temporary! They will go away. If you feel as though you have reached the point where suicide is the only answer, it is time to let someone know immediately. This is the first step to feeling better. The following steps will move you to safer ground and lead you in a positive direction out of depression. HOW TO COPE AND PREVENT SUICIDE  Let family, friends, teachers and/or counselors know. Get help. Try not to isolate yourself from those who care about you. Even though you may not feel sociable or think that you are not good company, talk with someone everyday. It is best if it is face to face. Remember, they will want to help you.   Eat a regularly spaced and well-balanced diet, and get plenty of rest.   Avoid alcohol and drugs because they will only make you feel worse and may also lower your inhibitions. Remove them from the home. If you are thinking of taking an overdose of  your prescribed medications, give your medicines to someone who can give them to you one day at a time. If you are on antidepressants, let your caregiver know of your feelings so he or she can provide a safer medication, if that is a concern.   Remove weapons or poisons from your home.   Try to stick to routines. That may mean just walking  the dog or feeding the cat. Follow a schedule and remind yourself that you have to keep that schedule every day. Play with your pets. If it is possible, and you do not have a pet, get one. They give you a sense of well-being, lower your blood pressure and make your heart feel good. They need you, and we all want to be needed.   Set some realistic goals and achieve them. Make a list and cross things off as you go. Accomplishments give a sense of worth. Wait until you are feeling better before doing things you find difficult or unpleasant to do.   If you are able, try to start exercising. Even half-hour periods of exercise each day will make you feel better. Getting out in the sun or into nature helps you recover from depression faster. If you have a favorite place to walk, take advantage of that.   Increase safe activities that have always given you pleasure. This may include playing your favorite music, reading a good book, painting a picture or playing your favorite instrument. Do whatever takes your mind off your depression and puts a smile on your face.   Keep your living space well lit with windows open, and let the sun shine in. Bright light definitely treats depression, not just people with the seasonal affective disorders (SAD).  Above all else remember, depression is temporary. It will go away. Do not contemplate suicide. Death as a permanent solution is not the answer. Suicide will take away the beautiful rest of life, and do lifelong harm to those around you who love you. Help is available. National Suicide Help Lines with 24 hour help are: 1-800-SUICIDE 325-144-2619 Document Released: 09/22/2002 Document Revised: 03/07/2011 Document Reviewed: 02/10/2007 [facility] Patient Information 2012 [place], [place].  RESOURCE GUIDE  Chronic Pain Problems: Contact [facility]  [phone] Patients need to be referred by their primary care doctor.  Insufficient Money for  Medicine: Contact United Way:  call "211" or [facility] 941-439-2635.  No Primary Care Doctor: - Call [facility]  [phone] - can help you locate a primary care doctor that  accepts your insurance, provides certain services, etc. - Physician Referral [phone]  Agencies that provide inexpensive medical care: - [facility]  [phone] - [facility]  [phone] - [facility]  [phone] [facility]  [phone] - [facility]  [phone] [facility]  [phone]  Medicaid-accepting [facility] Providers: - [facility]- [address]  [phone], Mon-Fri 9am-7pm, Sat 9am-1pm - [facility]- [address] [place], [address] [place]  [phone] - [facility]- [address] [place]  [phone] [facility]- [address]  [phone] - Renaye Rakers- [address] [place], [address], [phone]  Only accepts [place] patients after they have their name  applied to their card  Self Pay (no insurance) in [place]: - Sickle Cell Patients: Dr Willey Blade, [facility]  [address]  [place], [phone] - [facility]- [address] [place]  [phone]       [facility] [place]- [address]       -     [facility]- see information above (Speak to [employer] if you do not have insurance)       -  [facility]- [address] [place], [phone]       -  [facility] [place]- [address],  [phone]       -  [facility]- [address], [phone]       -  Dr Julio Sicks-  [address], [address], [place], [phone]       -  [facility]- [address] [phone]       -  [facility]- [address] [phone], also [address] [phone]       -    [facility]- [address]  [place], [phone], 1st & 3rd Saturday   every month, 10am-1pm  1) Find a Doctor and Pay Out of Pocket Although you won't have to find out who is covered by your insurance plan, it is a good idea to ask around and get recommendations. You will then need to call the office and see if the doctor you have chosen will accept you as a new patient and what types of options they offer for patients who are self-pay. Some doctors offer discounts or will set up payment plans for their patients who do not have insurance, but you will need to ask so you aren't surprised when you get to your appointment.  2) Contact Your Local Health Department Not all health departments have doctors that can see patients for sick visits, but many do, so it is worth a call to see if yours does. If you don't know where your local health department is, you can check in your phone book. The CDC also has a tool to help you locate your state's health department, and many state websites also have listings of all of their local health departments.  3) Find a Walk-in Clinic If your illness is not likely to be very severe or complicated, you may want to try a walk in clinic. These are popping up all over the country in pharmacies, drugstores, and shopping centers. They're usually staffed by nurse practitioners or physician assistants that have been trained to treat common illnesses and complaints. They're usually fairly quick and inexpensive. However, if you have serious medical issues or chronic medical problems, these are probably not your best option  STD Testing - [facility] Department of [facility], STD Clinic, [address] [place], phone [phone] or [phone].  Monday - Friday, call for an appointment. [facility] Department of [employer], STD Clinic, [address], [place], phone [phone] or [phone].  Monday - Friday, call for an appointment.  Abuse/Neglect: [facility] Child Abuse Hotline 929-484-2869 [facility] Child Abuse Hotline 334-124-9044 (After Hours)  Emergency Shelter:  [facility] 3130609734  Maternity Homes: - [facility] 7143779462 - [facility] 7344436841  MRSA Hotline #:   571-523-6086  [facility] Resources  [facility] [place]  [employer] [facility] Dept. [address]  [address]         [address] [place] [address]  Blondell Reveal Phone:  [phone]                                  Phone:  [phone]                   Phone:  [phone]  [facility], [phone] - [facility] - [phone]       -     [facility] in [place], [address],                                  [phone], [facility] Child Abuse Hotline (941)190-6046 or (681)041-9789 (After Hours)   Behavioral Health Services  Substance Abuse Resources: - [facility] Drug Services  [phone] - [facility] 9567650681 - [facility] [phone] Floydene Flock [phone] - Residential & Outpatient Substance Abuse Program  [phone]  Psychological Services: [facility]  [phone] Services  [phone] - [facility], [phone] [place]. [address] [place], ACCESS LINE: 812-466-7613 or 860-080-0610, [email or web address]  Dental Assistance  If unable to pay or uninsured, contact:  [facility] or [facility] to become qualified for the adult dental clinic.  Patients with Medicaid: [facility] [phone] [address], [phone] [address] [phone]  If unable to pay, or uninsured, contact [facility]  (250)578-7379) or [facility] Department [phone] in [place], [phone] in [facility]) to become qualified for the adult dental clinic  Other Low-Cost Community Dental Services: - [facility]- [address] [place], [place], [place], [address], [phone], [address]. [phone], 2nd and 4th Thursday of the month at 6:30am.  10 clients each day by appointment, can sometimes see walk-in patients if someone does not show for an appointment. [facility]- [address] [place], [place], [address], [phone] - [facility]- [address] [place], [place], [address], [phone] - [facility]- [phone] [facility] Health Department- [phone] [facility] Department- [phone]

## 2011-09-11 NOTE — ED Provider Notes (Signed)
History     CSN: [id number]  Arrival date & time 09/11/11  0439   First MD Initiated Contact with Patient 09/11/11 0703      Chief Complaint  Patient presents with  . Medical Clearance  . Leg Swelling    Patient is a 56 y.o. male presenting with mental health disorder. The history is provided by the patient.  Mental Health Problem The primary symptoms include dysphoric mood. The primary symptoms do not include delusions, hallucinations, bizarre behavior or disorganized speech. The current episode started more than 1 month ago (several months ago). This is a chronic problem.  The onset of the illness is precipitated by a stressful event (financial situations). The degree of incapacity that he is experiencing as a consequence of his illness is severe. Additional symptoms of the illness include anhedonia, agitation and flight of ideas. Additional symptoms of the illness do not include no euphoric mood. Associated symptoms comments: Pt thinks he sees things in his yard, no auditory hallucinations. He admits to suicidal ideas. He does have a plan to commit suicide. He contemplates harming himself. He does not contemplate injuring another person. Risk factors that are present for mental illness include a history of mental illness.    Past Medical History  Diagnosis Date  . Hypertension   . Anxiety and depression   . Hypercholesterolemia   . Bipolar 1 disorder     Past Surgical History  Procedure Date  . L hand surgery   . Splenectomy   . R knee arthoroscopy     Family History  Problem Relation Age of Onset  . Cancer Mother     History  Substance Use Topics  . Smoking status: Former Smoker    Quit date: 04/05/2001  . Smokeless tobacco: Never Used  . Alcohol Use: No      Review of Systems  Psychiatric/Behavioral: Positive for dysphoric mood and agitation. Negative for hallucinations.  All other systems reviewed and are negative.    Allergies  Review of patient's  allergies indicates no known allergies.  Home Medications   Current Outpatient Rx  Name Route Sig Dispense Refill  . ASPIRIN 81 MG PO CHEW Oral Chew 81 mg by mouth daily.    . ATENOLOL 50 MG PO TABS Oral Take 50 mg by mouth daily.      Marland Kitchen VITAMIN D 1000 UNITS PO TABS Oral Take 1,000 Units by mouth daily.    Marland Kitchen CLORAZEPATE DIPOTASSIUM 7.5 MG PO TABS Oral Take 7.5 mg by mouth 3 (three) times daily.      . FUROSEMIDE 40 MG PO TABS Oral Take 40 mg by mouth 2 (two) times daily.      . ADULT MULTIVITAMIN W/MINERALS CH Oral Take 1 tablet by mouth daily.      Marland Kitchen POTASSIUM CHLORIDE CRYS ER 20 MEQ PO TBCR Oral Take 20 mEq by mouth 2 (two) times daily.    Marland Kitchen PRAVASTATIN SODIUM 40 MG PO TABS Oral Take 40 mg by mouth daily.    Marland Kitchen RISPERIDONE 0.5 MG PO TABS Oral Take 0.5 mg by mouth 2 (two) times daily.    Marland Kitchen RISPERIDONE 0.5 MG PO TABS Oral Take 1 tablet (0.5 mg total) by mouth 2 (two) times daily. 40 tablet 0    BP 124/80  Pulse 52  Temp 98.1 F (36.7 C) (Oral)  Wt 190 lb (86.183 kg)  SpO2 96%  Physical Exam  Nursing note and vitals reviewed. Constitutional: He appears well-developed and well-nourished. No distress.  HENT:  Head: Normocephalic and atraumatic.  Right Ear: External ear normal.  Left Ear: External ear normal.  Eyes: Conjunctivae are normal. Right eye exhibits no discharge. Left eye exhibits no discharge. No scleral icterus.  Neck: Neck supple. No tracheal deviation present.  Cardiovascular: Normal rate, regular rhythm and intact distal pulses.   Pulmonary/Chest: Effort normal and breath sounds normal. No stridor. No respiratory distress. He has no wheezes. He has no rales.  Abdominal: Soft. Bowel sounds are normal. He exhibits no distension. There is no tenderness. There is no rebound and no guarding.  Musculoskeletal: He exhibits edema. He exhibits no tenderness.  Neurological: He is alert. He has normal strength. No sensory deficit. Cranial nerve deficit:  no gross defecits noted.  He exhibits normal muscle tone. He displays no seizure activity. Coordination normal.  Skin: Skin is warm and dry. No rash noted.  Psychiatric: His speech is not rapid and/or pressured, not delayed and not slurred. He is withdrawn. He is not agitated and not actively hallucinating. Thought content is not paranoid and not delusional. He exhibits a depressed mood. He expresses suicidal ideation. He is attentive.    ED Course  Procedures (including critical care time)  Labs Reviewed  CBC - Abnormal; Notable for the following:    WBC 11.2 (*)     Platelets 453 (*)     All other components within normal limits  COMPREHENSIVE METABOLIC PANEL - Abnormal; Notable for the following:    Glucose, Bld 102 (*)     GFR calc non Af Amer 75 (*)     GFR calc Af Amer 87 (*)     All other components within normal limits  URINE RAPID DRUG SCREEN (HOSP PERFORMED) - Abnormal; Notable for the following:    Benzodiazepines POSITIVE (*)     All other components within normal limits  ETHANOL   No results found.   No diagnosis found.    MDM  Pt is medically stable.  No signs of acute medical condition.  Will continue his home meds.  ACT team is evaluating the patient regarding psychiatric care.  Care turned over to oncoming MD        Celene Kras, MD 09/12/11 1525

## 2011-09-11 NOTE — ED Notes (Signed)
Pt completed tele-psych consult which has recommended discharge home. EDP notified and is in agreement with the disposition. Pt is agreeable to discharge home and signed a no harm contract. CSW provided pt with referrals for outpatient mental health services such as therapists, psychiatrists, mobile crisis, etc. Pt voiced no further concerns and understood that if he should feel he needs to return to the ED, that he is welcome to do so.   CSW signing off at this time.

## 2013-10-08 ENCOUNTER — Emergency Department (HOSPITAL_COMMUNITY)
Admission: EM | Admit: 2013-10-08 | Discharge: 2013-10-08 | Disposition: A | Discharge status: Home / Self Care | Payer: No Typology Code available for payment source | Attending: Emergency Medicine | Admitting: Emergency Medicine

## 2013-10-08 ENCOUNTER — Encounter (HOSPITAL_COMMUNITY): Payer: Self-pay | Admitting: Emergency Medicine

## 2013-10-08 DIAGNOSIS — S0993XA Unspecified injury of face, initial encounter: Secondary | ICD-10-CM | POA: Diagnosis not present

## 2013-10-08 DIAGNOSIS — S199XXA Unspecified injury of neck, initial encounter: Secondary | ICD-10-CM

## 2013-10-08 DIAGNOSIS — E7800 Pure hypercholesterolemia, unspecified: Secondary | ICD-10-CM | POA: Insufficient documentation

## 2013-10-08 DIAGNOSIS — M542 Cervicalgia: Secondary | ICD-10-CM

## 2013-10-08 DIAGNOSIS — F411 Generalized anxiety disorder: Secondary | ICD-10-CM | POA: Diagnosis not present

## 2013-10-08 DIAGNOSIS — IMO0002 Reserved for concepts with insufficient information to code with codable children: Secondary | ICD-10-CM | POA: Insufficient documentation

## 2013-10-08 DIAGNOSIS — Y9389 Activity, other specified: Secondary | ICD-10-CM | POA: Insufficient documentation

## 2013-10-08 DIAGNOSIS — Z87891 Personal history of nicotine dependence: Secondary | ICD-10-CM | POA: Insufficient documentation

## 2013-10-08 DIAGNOSIS — S4980XA Other specified injuries of shoulder and upper arm, unspecified arm, initial encounter: Secondary | ICD-10-CM | POA: Insufficient documentation

## 2013-10-08 DIAGNOSIS — Z79899 Other long term (current) drug therapy: Secondary | ICD-10-CM | POA: Diagnosis not present

## 2013-10-08 DIAGNOSIS — S46909A Unspecified injury of unspecified muscle, fascia and tendon at shoulder and upper arm level, unspecified arm, initial encounter: Secondary | ICD-10-CM | POA: Insufficient documentation

## 2013-10-08 DIAGNOSIS — Z23 Encounter for immunization: Secondary | ICD-10-CM | POA: Insufficient documentation

## 2013-10-08 DIAGNOSIS — Z7982 Long term (current) use of aspirin: Secondary | ICD-10-CM | POA: Diagnosis not present

## 2013-10-08 DIAGNOSIS — Y92410 Unspecified street and highway as the place of occurrence of the external cause: Secondary | ICD-10-CM | POA: Insufficient documentation

## 2013-10-08 DIAGNOSIS — I10 Essential (primary) hypertension: Secondary | ICD-10-CM | POA: Insufficient documentation

## 2013-10-08 DIAGNOSIS — M549 Dorsalgia, unspecified: Secondary | ICD-10-CM

## 2013-10-08 DIAGNOSIS — F319 Bipolar disorder, unspecified: Secondary | ICD-10-CM | POA: Diagnosis not present

## 2013-10-08 DIAGNOSIS — S8990XA Unspecified injury of unspecified lower leg, initial encounter: Secondary | ICD-10-CM | POA: Diagnosis present

## 2013-10-08 MED ORDER — DIAZEPAM 5 MG PO TABS: 5.0000 mg | ORAL_TABLET | Freq: Three times a day (TID) | ORAL | Status: DC | PRN

## 2013-10-08 MED ORDER — TETANUS-DIPHTH-ACELL PERTUSSIS 5-2.5-18.5 LF-MCG/0.5 IM SUSP
0.5000 mL | Freq: Once | INTRAMUSCULAR | Status: AC
  Administered 2013-10-08: 0.5 mL via INTRAMUSCULAR
  Filled 2013-10-08: qty 0.5
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
## 2013-10-09 NOTE — ED Provider Notes (Signed)
Medical screening examination/treatment/procedure(s) were performed by non-physician practitioner and as supervising physician I was immediately available for consultation/collaboration.   EKG Interpretation None         Ezequiel Essex, MD 10/09/13 0006

## 2013-10-16 ENCOUNTER — Encounter (HOSPITAL_COMMUNITY): Payer: Self-pay | Admitting: Emergency Medicine

## 2013-10-16 ENCOUNTER — Emergency Department (HOSPITAL_COMMUNITY)
Admission: EM | Admit: 2013-10-16 | Discharge: 2013-10-16 | Disposition: A | Discharge status: Home / Self Care | Payer: No Typology Code available for payment source | Attending: Emergency Medicine | Admitting: Emergency Medicine

## 2013-10-16 DIAGNOSIS — R42 Dizziness and giddiness: Secondary | ICD-10-CM | POA: Insufficient documentation

## 2013-10-16 DIAGNOSIS — F411 Generalized anxiety disorder: Secondary | ICD-10-CM | POA: Insufficient documentation

## 2013-10-16 DIAGNOSIS — I10 Essential (primary) hypertension: Secondary | ICD-10-CM | POA: Insufficient documentation

## 2013-10-16 DIAGNOSIS — E7800 Pure hypercholesterolemia, unspecified: Secondary | ICD-10-CM | POA: Insufficient documentation

## 2013-10-16 DIAGNOSIS — E785 Hyperlipidemia, unspecified: Secondary | ICD-10-CM | POA: Insufficient documentation

## 2013-10-16 DIAGNOSIS — Z79899 Other long term (current) drug therapy: Secondary | ICD-10-CM | POA: Insufficient documentation

## 2013-10-16 DIAGNOSIS — Z7982 Long term (current) use of aspirin: Secondary | ICD-10-CM | POA: Insufficient documentation

## 2013-10-16 DIAGNOSIS — Z87891 Personal history of nicotine dependence: Secondary | ICD-10-CM | POA: Diagnosis not present

## 2013-10-16 DIAGNOSIS — F319 Bipolar disorder, unspecified: Secondary | ICD-10-CM | POA: Insufficient documentation

## 2013-10-16 MED ORDER — MECLIZINE HCL 25 MG PO TABS: 25.0000 mg | ORAL_TABLET | Freq: Three times a day (TID) | ORAL | Status: DC | PRN

## 2013-10-16 NOTE — Discharge Instructions (Signed)
Your dizziness sounds like a positional issue. Use the meclizine as directed for the next week, and call the neurologist for further care. See your primary doctor to discuss your blood pressure medication regimen. Return to the emergency department if you have any changes or worsening of symptoms.  Dizziness  Dizziness means you feel unsteady or lightheaded. You might feel like you are going to pass out (faint). HOME CARE   Drink enough fluids to keep your pee (urine) clear or pale yellow.  Take your medicines exactly as told by your doctor. If you take blood pressure medicine, always stand up slowly from the lying or sitting position. Hold on to something to steady yourself.  If you need to stand in one place for a long time, move your legs often. Tighten and relax your leg muscles.  Have someone stay with you until you feel okay.  Do not drive or use heavy machinery if you feel dizzy.  Do not drink alcohol. GET HELP RIGHT AWAY IF:   You feel dizzy or lightheaded and it gets worse.  You feel sick to your stomach (nauseous), or you throw up (vomit).  You have trouble talking or walking.  You feel weak or have trouble using your arms, hands, or legs.  You cannot think clearly or have trouble forming sentences.  You have chest pain, belly (abdominal) pain, sweating, or you are short of breath.  Your vision changes.  You are bleeding.  You have problems from your medicine that seem to be getting worse. MAKE SURE YOU:   Understand these instructions.  Will watch your condition.  Will get help right away if you are not doing well or get worse. Document Released: 03/07/2011 Document Revised: 06/10/2011 Document Reviewed: 03/07/2011 [facility] Patient Information 2015 [place], [place]. This information is not intended to replace advice given to you by your health care provider. Make sure you discuss any questions you have with your health care provider.  Benign Positional  Vertigo Vertigo means you feel like you or your surroundings are moving when they are not. Benign positional vertigo is the most common form of vertigo. Benign means that the cause of your condition is not serious. Benign positional vertigo is more common in older adults. CAUSES  Benign positional vertigo is the result of an upset in the labyrinth system. This is an area in the middle ear that helps control your balance. This may be caused by a viral infection, head injury, or repetitive motion. However, often no specific cause is found. SYMPTOMS  Symptoms of benign positional vertigo occur when you move your head or eyes in different directions. Some of the symptoms may include:  Loss of balance and falls.  Vomiting.  Blurred vision.  Dizziness.  Nausea.  Involuntary eye movements (nystagmus). DIAGNOSIS  Benign positional vertigo is usually diagnosed by physical exam. If the specific cause of your benign positional vertigo is unknown, your caregiver may perform imaging tests, such as magnetic resonance imaging (MRI) or computed tomography (CT). TREATMENT  Your caregiver may recommend movements or procedures to correct the benign positional vertigo. Medicines such as meclizine, benzodiazepines, and medicines for nausea may be used to treat your symptoms. In rare cases, if your symptoms are caused by certain conditions that affect the inner ear, you may need surgery. HOME CARE INSTRUCTIONS   Follow your caregiver's instructions.  Move slowly. Do not make sudden body or head movements.  Avoid driving.  Avoid operating heavy machinery.  Avoid performing any tasks that  would be dangerous to you or others during a vertigo episode.  Drink enough fluids to keep your urine clear or pale yellow. SEEK IMMEDIATE MEDICAL CARE IF:   You develop problems with walking, weakness, numbness, or using your arms, hands, or legs.  You have difficulty speaking.  You develop severe  headaches.  Your nausea or vomiting continues or gets worse.  You develop visual changes.  Your family or friends notice any behavioral changes.  Your condition gets worse.  You have a fever.  You develop a stiff neck or sensitivity to light. MAKE SURE YOU:   Understand these instructions.  Will watch your condition.  Will get help right away if you are not doing well or get worse. Document Released: 12/24/2005 Document Revised: 06/10/2011 Document Reviewed: 12/06/2010 [facility] Patient Information 2015 [place], [place]. This information is not intended to replace advice given to you by your health care provider. Make sure you discuss any questions you have with your health care provider.
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
## 2013-10-21 NOTE — ED Provider Notes (Signed)
Medical screening examination/treatment/procedure(s) were performed by non-physician practitioner and as supervising physician I was immediately available for consultation/collaboration.   Houston Siren III, MD 10/21/13 [phone]

## 2013-11-06 ENCOUNTER — Emergency Department (HOSPITAL_COMMUNITY)
Admission: EM | Admit: 2013-11-06 | Discharge: 2013-11-06 | Disposition: A | Discharge status: Home / Self Care | Payer: Medicare Other | Attending: Emergency Medicine | Admitting: Emergency Medicine

## 2013-11-06 ENCOUNTER — Other Ambulatory Visit: Payer: Self-pay

## 2013-11-06 ENCOUNTER — Emergency Department (HOSPITAL_COMMUNITY): Payer: Medicare Other

## 2013-11-06 ENCOUNTER — Encounter (HOSPITAL_COMMUNITY): Payer: Self-pay | Admitting: Emergency Medicine

## 2013-11-06 DIAGNOSIS — Z87828 Personal history of other (healed) physical injury and trauma: Secondary | ICD-10-CM | POA: Diagnosis not present

## 2013-11-06 DIAGNOSIS — Z79899 Other long term (current) drug therapy: Secondary | ICD-10-CM | POA: Diagnosis not present

## 2013-11-06 DIAGNOSIS — F319 Bipolar disorder, unspecified: Secondary | ICD-10-CM | POA: Diagnosis not present

## 2013-11-06 DIAGNOSIS — R42 Dizziness and giddiness: Secondary | ICD-10-CM | POA: Diagnosis present

## 2013-11-06 DIAGNOSIS — Z87891 Personal history of nicotine dependence: Secondary | ICD-10-CM | POA: Insufficient documentation

## 2013-11-06 DIAGNOSIS — I10 Essential (primary) hypertension: Secondary | ICD-10-CM | POA: Diagnosis not present

## 2013-11-06 DIAGNOSIS — E7800 Pure hypercholesterolemia, unspecified: Secondary | ICD-10-CM | POA: Insufficient documentation

## 2013-11-06 DIAGNOSIS — R51 Headache: Secondary | ICD-10-CM | POA: Insufficient documentation

## 2013-11-06 DIAGNOSIS — R609 Edema, unspecified: Secondary | ICD-10-CM | POA: Diagnosis not present

## 2013-11-06 LAB — CBC WITH DIFFERENTIAL/PLATELET
Basophils Absolute: 0.2 10*3/uL — ABNORMAL HIGH (ref 0.0–0.1)
Basophils Relative: 1 % (ref 0–1)
Eosinophils Absolute: 0.6 10*3/uL (ref 0.0–0.7)
Eosinophils Relative: 5 % (ref 0–5)
HCT: 43.3 % (ref 39.0–52.0)
Hemoglobin: 14.4 g/dL (ref 13.0–17.0)
Lymphocytes Relative: 40 % (ref 12–46)
Lymphs Abs: 5.8 10*3/uL — ABNORMAL HIGH (ref 0.7–4.0)
MCH: 32.6 pg (ref 26.0–34.0)
MCHC: 33.3 g/dL (ref 30.0–36.0)
MCV: 98 fL (ref 78.0–100.0)
Monocytes Absolute: 1.8 10*3/uL — ABNORMAL HIGH (ref 0.1–1.0)
Monocytes Relative: 13 % — ABNORMAL HIGH (ref 3–12)
Neutro Abs: 5.9 10*3/uL (ref 1.7–7.7)
Neutrophils Relative %: 41 % — ABNORMAL LOW (ref 43–77)
Platelets: 416 10*3/uL — ABNORMAL HIGH (ref 150–400)
RBC: 4.42 MIL/uL (ref 4.22–5.81)
RDW: 13.8 % (ref 11.5–15.5)
WBC: 14.2 10*3/uL — ABNORMAL HIGH (ref 4.0–10.5)

## 2013-11-06 LAB — BASIC METABOLIC PANEL
Anion gap: 14 (ref 5–15)
BUN: 10 mg/dL (ref 6–23)
CO2: 26 mEq/L (ref 19–32)
Calcium: 8.8 mg/dL (ref 8.4–10.5)
Chloride: 100 mEq/L (ref 96–112)
Creatinine, Ser: 0.83 mg/dL (ref 0.50–1.35)
GFR calc Af Amer: >90 mL/min (ref >90)
GFR calc non Af Amer: >90 mL/min (ref >90)
Glucose, Bld: 140 mg/dL — ABNORMAL HIGH (ref 70–99)
Potassium: 3.9 mEq/L (ref 3.7–5.3)
Sodium: 140 mEq/L (ref 137–147)

## 2013-11-06 LAB — TROPONIN I: Troponin I: <0.3 ng/mL (ref <0.30)

## 2013-11-06 MED ORDER — MECLIZINE HCL 25 MG PO TABS: 25.0000 mg | ORAL_TABLET | Freq: Three times a day (TID) | ORAL | Status: DC | PRN

## 2013-11-06 MED ORDER — MECLIZINE HCL 50 MG PO TABS: 50.0000 mg | ORAL_TABLET | Freq: Three times a day (TID) | ORAL | Status: DC | PRN

## 2013-11-06 NOTE — Discharge Instructions (Signed)
Vertigo °Vertigo means you feel like you or your surroundings are moving when they are not. Vertigo can be dangerous if it occurs when you are at work, driving, or performing difficult activities.  °CAUSES  °Vertigo occurs when there is a conflict of signals sent to your brain from the visual and sensory systems in your body. There are many different causes of vertigo, including: °· Infections, especially in the inner ear. °· A bad reaction to a drug or misuse of alcohol and medicines. °· Withdrawal from drugs or alcohol. °· Rapidly changing positions, such as lying down or rolling over in bed. °· A migraine headache. °· Decreased blood flow to the brain. °· Increased pressure in the brain from a head injury, infection, tumor, or bleeding. °SYMPTOMS  °You may feel as though the world is spinning around or you are falling to the ground. Because your balance is upset, vertigo can cause nausea and vomiting. You may have involuntary eye movements (nystagmus). °DIAGNOSIS  °Vertigo is usually diagnosed by physical exam. If the cause of your vertigo is unknown, your caregiver may perform imaging tests, such as an MRI scan (magnetic resonance imaging). °TREATMENT  °Most cases of vertigo resolve on their own, without treatment. Depending on the cause, your caregiver may prescribe certain medicines. If your vertigo is related to body position issues, your caregiver may recommend movements or procedures to correct the problem. In rare cases, if your vertigo is caused by certain inner ear problems, you may need surgery. °HOME CARE INSTRUCTIONS  °· Follow your caregiver's instructions. °· Avoid driving. °· Avoid operating heavy machinery. °· Avoid performing any tasks that would be dangerous to you or others during a vertigo episode. °· Tell your caregiver if you notice that certain medicines seem to be causing your vertigo. Some of the medicines used to treat vertigo episodes can actually make them worse in some people. °SEEK  IMMEDIATE MEDICAL CARE IF:  °· Your medicines do not relieve your vertigo or are making it worse. °· You develop problems with talking, walking, weakness, or using your arms, hands, or legs. °· You develop severe headaches. °· Your nausea or vomiting continues or gets worse. °· You develop visual changes. °· A family member notices behavioral changes. °· Your condition gets worse. °MAKE SURE YOU: °· Understand these instructions. °· Will watch your condition. °· Will get help right away if you are not doing well or get worse. °Document Released: 12/26/2004 Document Revised: 06/10/2011 Document Reviewed: 10/04/2010 °[employer]® Patient Information ©2015 [employer]. This information is not intended to replace advice given to you by your health care provider. Make sure you discuss any questions you have with your health care provider. ° °Hypertension °Hypertension, commonly called high blood pressure, is when the force of blood pumping through your arteries is too strong. Your arteries are the blood vessels that carry blood from your heart throughout your body. A blood pressure reading consists of a higher number over a lower number, such as 110/72. The higher number (systolic) is the pressure inside your arteries when your heart pumps. The lower number (diastolic) is the pressure inside your arteries when your heart relaxes. Ideally you want your blood pressure below 120/80. °Hypertension forces your heart to work harder to pump blood. Your arteries may become narrow or stiff. Having hypertension puts you at risk for heart disease, stroke, and other problems.  °RISK FACTORS °Some risk factors for high blood pressure are controllable. Others are not.  °Risk factors you cannot control   include:  °· Race. You may be at higher risk if you are African American. °· Age. Risk increases with age. °· Gender. Men are at higher risk than women before age 45 years. After age 65, women are at higher risk than men. °Risk factors  you can control include: °· Not getting enough exercise or physical activity. °· Being overweight. °· Getting too much fat, sugar, calories, or salt in your diet. °· Drinking too much alcohol. °SIGNS AND SYMPTOMS °Hypertension does not usually cause signs or symptoms. Extremely high blood pressure (hypertensive crisis) may cause headache, anxiety, shortness of breath, and nosebleed. °DIAGNOSIS  °To check if you have hypertension, your health care provider will measure your blood pressure while you are seated, with your arm held at the level of your heart. It should be measured at least twice using the same arm. Certain conditions can cause a difference in blood pressure between your right and left arms. A blood pressure reading that is higher than normal on one occasion does not mean that you need treatment. If one blood pressure reading is high, ask your health care provider about having it checked again. °TREATMENT  °Treating high blood pressure includes making lifestyle changes and possibly taking medicine. Living a healthy lifestyle can help lower high blood pressure. You may need to change some of your habits. °Lifestyle changes may include: °· Following the DASH diet. This diet is high in fruits, vegetables, and whole grains. It is low in salt, red meat, and added sugars. °· Getting at least 2½ hours of brisk physical activity every week. °· Losing weight if necessary. °· Not smoking. °· Limiting alcoholic beverages. °· Learning ways to reduce stress. ° If lifestyle changes are not enough to get your blood pressure under control, your health care provider may prescribe medicine. You may need to take more than one. Work closely with your health care provider to understand the risks and benefits. °HOME CARE INSTRUCTIONS °· Have your blood pressure rechecked as directed by your health care provider.   °· Take medicines only as directed by your health care provider. Follow the directions carefully. Blood pressure  medicines must be taken as prescribed. The medicine does not work as well when you skip doses. Skipping doses also puts you at risk for problems.   °· Do not smoke.   °· Monitor your blood pressure at home as directed by your health care provider.  °SEEK MEDICAL CARE IF:  °· You think you are having a reaction to medicines taken. °· You have recurrent headaches or feel dizzy. °· You have swelling in your ankles. °· You have trouble with your vision. °SEEK IMMEDIATE MEDICAL CARE IF: °· You develop a severe headache or confusion. °· You have unusual weakness, numbness, or feel faint. °· You have severe chest or abdominal pain. °· You vomit repeatedly. °· You have trouble breathing. °MAKE SURE YOU:  °· Understand these instructions. °· Will watch your condition. °· Will get help right away if you are not doing well or get worse. °Document Released: 03/18/2005 Document Revised: 08/02/2013 Document Reviewed: 01/08/2013 °[employer]® Patient Information ©2015 [employer]. This information is not intended to replace advice given to you by your health care provider. Make sure you discuss any questions you have with your health care provider. ° °

## 2013-11-06 NOTE — ED Notes (Signed)
MD at bedside. 
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
## 2013-11-06 NOTE — ED Notes (Signed)
Patient discharged with all personal belongings. 

## 2013-11-06 NOTE — ED Notes (Signed)
The pt had his bp checked Wednesday and it was high.  It has been high since then.  He takes bp med

## 2013-11-25 ENCOUNTER — Emergency Department (HOSPITAL_COMMUNITY)
Admission: EM | Admit: 2013-11-25 | Discharge: 2013-11-26 | Disposition: A | Discharge status: Home / Self Care | Payer: Medicare Other | Attending: Emergency Medicine | Admitting: Emergency Medicine

## 2013-11-25 ENCOUNTER — Encounter (HOSPITAL_COMMUNITY): Payer: Self-pay | Admitting: Emergency Medicine

## 2013-11-25 DIAGNOSIS — M7989 Other specified soft tissue disorders: Secondary | ICD-10-CM | POA: Diagnosis present

## 2013-11-25 DIAGNOSIS — Z862 Personal history of diseases of the blood and blood-forming organs and certain disorders involving the immune mechanism: Secondary | ICD-10-CM | POA: Diagnosis not present

## 2013-11-25 DIAGNOSIS — F319 Bipolar disorder, unspecified: Secondary | ICD-10-CM | POA: Insufficient documentation

## 2013-11-25 DIAGNOSIS — F411 Generalized anxiety disorder: Secondary | ICD-10-CM | POA: Insufficient documentation

## 2013-11-25 DIAGNOSIS — Z79899 Other long term (current) drug therapy: Secondary | ICD-10-CM | POA: Insufficient documentation

## 2013-11-25 DIAGNOSIS — Z87891 Personal history of nicotine dependence: Secondary | ICD-10-CM | POA: Diagnosis not present

## 2013-11-25 DIAGNOSIS — Z8639 Personal history of other endocrine, nutritional and metabolic disease: Secondary | ICD-10-CM | POA: Insufficient documentation

## 2013-11-25 DIAGNOSIS — I10 Essential (primary) hypertension: Secondary | ICD-10-CM | POA: Diagnosis not present

## 2013-11-25 NOTE — ED Provider Notes (Addendum)
CSN: [id number]     Arrival date & time 11/25/13  2212 History   First MD Initiated Contact with Patient 11/25/13 2324    This chart was scribed for Wynetta Fines, MD by Terressa Koyanagi, ED Scribe. This patient was seen in room [facility]11/[facility]11 and the patient's care was started at 11:26 PM.  Chief Complaint  Patient presents with  . Leg Swelling   The history is provided by the patient. No language interpreter was used.   HPI Comments: PCP: No PCP Per Patient  Kenneth [name] is a 58 y.o. male, with a Hx of HTN;  hypercholesterolemia; splenectomy; and psychiatric illness, who presents to the Emergency Department complaining of swelling with associated redness to the left lower leg onset one week ago; he is having lesser symptoms in the right lower leg. Pt denies fever, chills, pain, recent injury, recent falls, chest pain, or SOB.   Past Medical History  Diagnosis Date  . Hypertension   . Anxiety and depression   . Hypercholesterolemia   . Bipolar 1 disorder    Past Surgical History  Procedure Laterality Date  . L hand surgery    . Splenectomy    . R knee arthoroscopy     Family History  Problem Relation Age of Onset  . Cancer Mother    History  Substance Use Topics  . Smoking status: Former Smoker    Quit date: 04/05/2001  . Smokeless tobacco: Never Used  . Alcohol Use: No    Review of Systems  A complete 10 system review of systems was obtained and all systems are negative except as noted in the HPI and PMH.    Allergies  Review of patient's allergies indicates no known allergies.  Home Medications   Prior to Admission medications   Medication Sig Start Date End Date Taking? Authorizing Provider  acetaminophen (TYLENOL) 500 MG tablet Take 500 mg by mouth every 6 (six) hours as needed (headache).    Historical Provider, MD  atenolol (TENORMIN) 50 MG tablet Take 50 mg by mouth daily.      Historical Provider, MD  clorazepate (TRANXENE) 7.5 MG tablet Take 7.5 mg by mouth  3 (three) times daily.      Historical Provider, MD  meclizine (ANTIVERT) 25 MG tablet Take 1 tablet (25 mg total) by mouth 3 (three) times daily as needed. 11/06/13   Merryl Hacker, MD  Omega-3 Fatty Acids (OMEGA 3 PO) Take 1 capsule by mouth daily.    Historical Provider, MD  QUEtiapine (SEROQUEL) 200 MG tablet Take 1 tablet (200 mg total) by mouth at bedtime. 09/11/11 10/11/11  Virgel Manifold, MD  sertraline (ZOLOFT) 100 MG tablet Take 50 mg by mouth daily. Takes half of 100mg     Historical Provider, MD  sertraline (ZOLOFT) 25 MG tablet Take 1 tablet (25 mg total) by mouth daily. 09/11/11 09/10/12  Virgel Manifold, MD   Triage Vitals: BP 164/94  Pulse 95  Temp(Src) 98 F (36.7 C) (Oral)  Ht 5\' 9"  (1.753 m)  Wt 230 lb (104.327 kg)  BMI 33.95 kg/m2  SpO2 100% Physical Exam  Nursing note and vitals reviewed.  General: Well-developed, well-nourished male in no acute distress; appearance consistent with age of record HENT: normocephalic; atraumatic Eyes: pupils equal, round and reactive to light; extraocular muscles intact Neck: supple Heart: regular rate and rhythm; no murmurs, rubs or gallops Lungs: clear to auscultation bilaterally Abdomen: soft; nondistended; nontender; no masses or hepatosplenomegaly; bowel sounds present Extremities: No deformity;  full range of motion; pulses normal; edema of the lower legs, left greater than right; erythema to bilateral lower leg, left greater than the right  Neurologic: Awake, alert and oriented; motor function intact in all extremities and symmetric; no facial droop Skin: Warm and dry Psychiatric: Normal mood and affect  ED Course  Procedures (including critical care time) DIAGNOSTIC STUDIES: Oxygen Saturation is 100% on RA, nl by my interpretation.    COORDINATION OF CARE: 11:30 PM-Discussed treatment plan which includes labs with pt at bedside and pt agreed to plan.   MDM   Nursing notes and vitals signs, including pulse oximetry,  reviewed.  Summary of this visit's results, reviewed by myself:  Labs:  Results for orders placed during the hospital encounter of 11/25/13 (from the past 24 hour(s))  D-DIMER, QUANTITATIVE     Status: None   Collection Time    11/26/13 12:08 AM      Result Value Ref Range   D-Dimer, Quant 0.40  0.00 - 0.48 ug/mL-FEU  BASIC METABOLIC PANEL     Status: Abnormal   Collection Time    11/26/13 12:08 AM      Result Value Ref Range   Sodium 139  137 - 147 mEq/L   Potassium 3.9  3.7 - 5.3 mEq/L   Chloride 99  96 - 112 mEq/L   CO2 29  19 - 32 mEq/L   Glucose, Bld 139 (*) 70 - 99 mg/dL   BUN 8  6 - 23 mg/dL   Creatinine, Ser 0.86  0.50 - 1.35 mg/dL   Calcium 8.6  8.4 - 10.5 mg/dL   GFR calc non Af Amer >90  >90 mL/min   GFR calc Af Amer >90  >90 mL/min   Anion gap 11  5 - 15  CBC WITH DIFFERENTIAL     Status: Abnormal   Collection Time    11/26/13 12:08 AM      Result Value Ref Range   WBC 12.0 (*) 4.0 - 10.5 K/uL   RBC 4.23  4.22 - 5.81 MIL/uL   Hemoglobin 14.1  13.0 - 17.0 g/dL   HCT 40.7  39.0 - 52.0 %   MCV 96.2  78.0 - 100.0 fL   MCH 33.3  26.0 - 34.0 pg   MCHC 34.6  30.0 - 36.0 g/dL   RDW 13.8  11.5 - 15.5 %   Platelets 418 (*) 150 - 400 K/uL   Neutrophils Relative % 51  43 - 77 %   Neutro Abs 6.3  1.7 - 7.7 K/uL   Lymphocytes Relative 29  12 - 46 %   Lymphs Abs 3.4  0.7 - 4.0 K/uL   Monocytes Relative 15 (*) 3 - 12 %   Monocytes Absolute 1.8 (*) 0.1 - 1.0 K/uL   Eosinophils Relative 4  0 - 5 %   Eosinophils Absolute 0.4  0.0 - 0.7 K/uL   Basophils Relative 1  0 - 1 %   Basophils Absolute 0.1  0.0 - 0.1 K/uL   1:31 AM Some improvement in edema with elevation. We'll start him on Levaquin for possible cellulitis (no evidence of prolonged QT on prior EKGs) and have him return later today for Doppler ultrasound.  I personally performed the services described in this documentation, which was scribed in my presence. The recorded information has been reviewed and is  accurate.   Wynetta Fines, MD 11/26/13 0131  Wynetta Fines, MD 11/26/13 3267  Wynetta Fines, MD 11/26/13  0138 

## 2013-11-25 NOTE — ED Notes (Signed)
Having swelling to left lower leg for last 7 days.

## 2013-11-26 ENCOUNTER — Other Ambulatory Visit: Payer: Self-pay | Admitting: Internal Medicine

## 2013-11-26 ENCOUNTER — Ambulatory Visit (HOSPITAL_COMMUNITY)
Admit: 2013-11-26 | Discharge: 2013-11-26 | Disposition: A | Discharge status: Home / Self Care | Payer: Medicare Other | Source: Ambulatory Visit | Attending: Emergency Medicine | Admitting: Emergency Medicine

## 2013-11-26 DIAGNOSIS — M7989 Other specified soft tissue disorders: Secondary | ICD-10-CM | POA: Diagnosis not present

## 2013-11-26 LAB — CBC WITH DIFFERENTIAL/PLATELET
Basophils Absolute: 0.1 10*3/uL (ref 0.0–0.1)
Basophils Relative: 1 % (ref 0–1)
Eosinophils Absolute: 0.4 10*3/uL (ref 0.0–0.7)
Eosinophils Relative: 4 % (ref 0–5)
HCT: 40.7 % (ref 39.0–52.0)
Hemoglobin: 14.1 g/dL (ref 13.0–17.0)
Lymphocytes Relative: 29 % (ref 12–46)
Lymphs Abs: 3.4 10*3/uL (ref 0.7–4.0)
MCH: 33.3 pg (ref 26.0–34.0)
MCHC: 34.6 g/dL (ref 30.0–36.0)
MCV: 96.2 fL (ref 78.0–100.0)
Monocytes Absolute: 1.8 10*3/uL — ABNORMAL HIGH (ref 0.1–1.0)
Monocytes Relative: 15 % — ABNORMAL HIGH (ref 3–12)
Neutro Abs: 6.3 10*3/uL (ref 1.7–7.7)
Neutrophils Relative %: 51 % (ref 43–77)
Platelets: 418 10*3/uL — ABNORMAL HIGH (ref 150–400)
RBC: 4.23 MIL/uL (ref 4.22–5.81)
RDW: 13.8 % (ref 11.5–15.5)
WBC: 12 10*3/uL — ABNORMAL HIGH (ref 4.0–10.5)

## 2013-11-26 LAB — BASIC METABOLIC PANEL
Anion gap: 11 (ref 5–15)
BUN: 8 mg/dL (ref 6–23)
CO2: 29 mEq/L (ref 19–32)
Calcium: 8.6 mg/dL (ref 8.4–10.5)
Chloride: 99 mEq/L (ref 96–112)
Creatinine, Ser: 0.86 mg/dL (ref 0.50–1.35)
GFR calc Af Amer: >90 mL/min (ref >90)
GFR calc non Af Amer: >90 mL/min (ref >90)
Glucose, Bld: 139 mg/dL — ABNORMAL HIGH (ref 70–99)
Potassium: 3.9 mEq/L (ref 3.7–5.3)
Sodium: 139 mEq/L (ref 137–147)

## 2013-11-26 LAB — D-DIMER, QUANTITATIVE: D-Dimer, Quant: 0.4 ug/mL-FEU (ref 0.00–0.48)

## 2013-11-26 MED ORDER — LEVOFLOXACIN 750 MG PO TABS: 750.0000 mg | ORAL_TABLET | Freq: Every day | ORAL | Status: DC

## 2013-11-26 MED ORDER — LEVOFLOXACIN 750 MG PO TABS
750.0000 mg | ORAL_TABLET | Freq: Once | ORAL | Status: AC
  Administered 2013-11-26: 750 mg via ORAL
  Filled 2013-11-26: qty 1

## 2013-11-26 NOTE — Discharge Instructions (Signed)
Peripheral Edema °You have swelling in your legs (peripheral edema). This swelling is due to excess accumulation of salt and water in your body. Edema may be a sign of heart, kidney or liver disease, or a side effect of a medication. It may also be due to problems in the leg veins. Elevating your legs and using special support stockings may be very helpful, if the cause of the swelling is due to poor venous circulation. Avoid long periods of standing, whatever the cause. °Treatment of edema depends on identifying the cause. Chips, pretzels, pickles and other salty foods should be avoided. Restricting salt in your diet is almost always needed. Water pills (diuretics) are often used to remove the excess salt and water from your body via urine. These medicines prevent the kidney from reabsorbing sodium. This increases urine flow. °Diuretic treatment may also result in lowering of potassium levels in your body. Potassium supplements may be needed if you have to use diuretics daily. Daily weights can help you keep track of your progress in clearing your edema. You should call your caregiver for follow up care as recommended. °SEEK IMMEDIATE MEDICAL CARE IF:  °· You have increased swelling, pain, redness, or heat in your legs. °· You develop shortness of breath, especially when lying down. °· You develop chest or abdominal pain, weakness, or fainting. °· You have a fever. °Document Released: 04/25/2004 Document Revised: 06/10/2011 Document Reviewed: 04/05/2009 °[employer]® Patient Information ©2015 [employer]. This information is not intended to replace advice given to you by your health care provider. Make sure you discuss any questions you have with your health care provider. ° °

## 2014-02-17 ENCOUNTER — Other Ambulatory Visit: Payer: Self-pay | Admitting: Physician Assistant

## 2014-04-26 ENCOUNTER — Emergency Department (HOSPITAL_COMMUNITY)
Admission: EM | Admit: 2014-04-26 | Discharge: 2014-04-27 | Disposition: A | Discharge status: Home / Self Care | Payer: Medicare Other | Attending: Emergency Medicine | Admitting: Emergency Medicine

## 2014-04-26 ENCOUNTER — Encounter (HOSPITAL_COMMUNITY): Payer: Self-pay | Admitting: Emergency Medicine

## 2014-04-26 DIAGNOSIS — F419 Anxiety disorder, unspecified: Secondary | ICD-10-CM | POA: Insufficient documentation

## 2014-04-26 DIAGNOSIS — F319 Bipolar disorder, unspecified: Secondary | ICD-10-CM | POA: Diagnosis not present

## 2014-04-26 DIAGNOSIS — L509 Urticaria, unspecified: Secondary | ICD-10-CM | POA: Diagnosis not present

## 2014-04-26 DIAGNOSIS — Z79899 Other long term (current) drug therapy: Secondary | ICD-10-CM | POA: Insufficient documentation

## 2014-04-26 DIAGNOSIS — E109 Type 1 diabetes mellitus without complications: Secondary | ICD-10-CM | POA: Insufficient documentation

## 2014-04-26 DIAGNOSIS — I10 Essential (primary) hypertension: Secondary | ICD-10-CM | POA: Diagnosis not present

## 2014-04-26 DIAGNOSIS — R21 Rash and other nonspecific skin eruption: Secondary | ICD-10-CM | POA: Diagnosis present

## 2014-04-26 DIAGNOSIS — Z87891 Personal history of nicotine dependence: Secondary | ICD-10-CM | POA: Diagnosis not present

## 2014-04-26 MED ORDER — DEXAMETHASONE SODIUM PHOSPHATE 10 MG/ML IJ SOLN
10.0000 mg | Freq: Once | INTRAMUSCULAR | Status: AC
  Administered 2014-04-26: 10 mg via INTRAMUSCULAR
  Filled 2014-04-26: qty 1

## 2014-04-26 MED ORDER — HYDROXYZINE HCL 25 MG PO TABS
25.0000 mg | ORAL_TABLET | Freq: Once | ORAL | Status: AC
  Administered 2014-04-26: 25 mg via ORAL
  Filled 2014-04-26: qty 1

## 2014-04-26 MED ORDER — HYDROXYZINE HCL 25 MG PO TABS: 25.0000 mg | ORAL_TABLET | Freq: Four times a day (QID) | ORAL | Status: DC

## 2014-04-26 MED ORDER — PREDNISONE 20 MG PO TABS: ORAL_TABLET | ORAL | Status: DC

## 2014-04-26 NOTE — ED Notes (Signed)
The patient said he started having red marks on his body.  He made an appointment with the MD for the allergic reaction.  The patient said he cannot stand the itching anymore.  He denies SOB, or any tongue swelling.  He says he is taking a new antibiotic and metformin.   The reason he came today versus Saturday is because he cannot stand the itching anymore.

## 2014-04-26 NOTE — ED Provider Notes (Signed)
CSN: [id number]     Arrival date & time 04/26/14  1938 History   First MD Initiated Contact with Patient 04/26/14 2132     Chief Complaint  Patient presents with  . Allergic Reaction    The patient said he started having red marks on his body.  He made an appointment with the MD for the allergic reaction.       (Consider location/radiation/quality/duration/timing/severity/associated sxs/prior Treatment) HPI Comments: Patient presents with complaint of an itchy rash that he's had for the last 3 days. Rash began in a very localized area was not severe however today was much worse and has extended over his entire body. He has not had any trouble breathing, wheezing, lip or tongue swelling, lightheadedness or syncope, nausea, vomiting, or diarrhea. Patient took Benadryl yesterday one time but this did not help. Patient is currently on an antibiotic, doxycycline, for a left lower extremity cellulitis. Patient was almost finished with this antibiotic. He thinks that taking the antibiotic makes the symptoms worse. His last dose was yesterday. Patient was also recently diagnosed with type 1 diabetes and started on metformin. He has been taking this for several weeks. No other treatments prior to arrival. No new soaps or detergents. No new foods. No other history of allergic reactions.  Patient has PCP follow-up tomorrow.   Patient is a 59 y.o. male presenting with allergic reaction. The history is provided by the patient.  Allergic Reaction Presenting symptoms: rash   Presenting symptoms: no difficulty swallowing and no wheezing     Past Medical History  Diagnosis Date  . Hypertension   . Anxiety and depression   . Hypercholesterolemia   . Bipolar 1 disorder    Past Surgical History  Procedure Laterality Date  . L hand surgery    . Splenectomy    . R knee arthoroscopy     Family History  Problem Relation Age of Onset  . Cancer Mother    History  Substance Use Topics  . Smoking status:  Former Smoker    Quit date: 04/05/2001  . Smokeless tobacco: Never Used  . Alcohol Use: No    Review of Systems  Constitutional: Negative for fever.  HENT: Negative for facial swelling and trouble swallowing.   Eyes: Negative for redness.  Respiratory: Negative for shortness of breath, wheezing and stridor.   Cardiovascular: Negative for chest pain.  Gastrointestinal: Negative for nausea and vomiting.  Musculoskeletal: Negative for myalgias.  Skin: Positive for color change (left lower extremity cellulitis, improving) and rash.  Neurological: Negative for light-headedness.  Psychiatric/Behavioral: Negative for confusion.    Allergies  Review of patient's allergies indicates no known allergies.  Home Medications   Prior to Admission medications   Medication Sig Start Date End Date Taking? Authorizing Provider  acetaminophen (TYLENOL) 500 MG tablet Take 500 mg by mouth every 6 (six) hours as needed (headache).    Historical Provider, MD  atenolol (TENORMIN) 50 MG tablet Take 50 mg by mouth daily.      Historical Provider, MD  clorazepate (TRANXENE) 7.5 MG tablet Take 7.5 mg by mouth 3 (three) times daily.      Historical Provider, MD  levofloxacin (LEVAQUIN) 750 MG tablet Take 1 tablet (750 mg total) by mouth daily. X 7 days 11/26/13   Wynetta Fines, MD  meclizine (ANTIVERT) 25 MG tablet Take 1 tablet (25 mg total) by mouth 3 (three) times daily as needed. 11/06/13   Merryl Hacker, MD  Omega-3 Fatty Acids ([place]  3 PO) Take 1 capsule by mouth daily.    Historical Provider, MD  QUEtiapine (SEROQUEL) 200 MG tablet Take 1 tablet (200 mg total) by mouth at bedtime. 09/11/11 10/11/11  Virgel Manifold, MD  sertraline (ZOLOFT) 100 MG tablet Take 50 mg by mouth daily. Takes half of 100mg     Historical Provider, MD  sertraline (ZOLOFT) 25 MG tablet Take 1 tablet (25 mg total) by mouth daily. 09/11/11 09/10/12  Virgel Manifold, MD   BP 148/87 mmHg  Pulse 106  Temp(Src) 98.4 F (36.9 C) (Oral)   Resp 20  SpO2 94%   Physical Exam  Constitutional: He appears well-developed and well-nourished.  HENT:  Head: Normocephalic and atraumatic.  No angioedema.  Eyes: Conjunctivae are normal. Right eye exhibits no discharge. Left eye exhibits no discharge.  Neck: Normal range of motion. Neck supple.  Cardiovascular: Normal rate, regular rhythm and normal heart sounds.   Pulmonary/Chest: Effort normal and breath sounds normal. No stridor. No respiratory distress. He has no wheezes. He has no rales.  Abdominal: Soft. There is no tenderness.  Neurological: He is alert.  Skin: Skin is warm and dry.  Patient with dense urticaria over his torso, arms, legs, neck and face. Multiple areas of excoriations.  Patient with resolving left lower extremity cellulitis around ankle. There is a small healing wound in the middle of the erythema. Area is slightly warm to touch. It is pink in color. No drainage.   Psychiatric: He has a normal mood and affect.  Nursing note and vitals reviewed.   ED Course  Procedures (including critical care time) Labs Review Labs Reviewed - No data to display  Imaging Review No results found.   EKG Interpretation None       10:27 PM Patient seen and examined. Medications ordered.   Vital signs reviewed and are as follows: BP 148/87 mmHg  Pulse 106  Temp(Src) 98.4 F (36.9 C) (Oral)  Resp 20  SpO2 94%  11:21 PM patient discharged to home with prescription for hydroxyzine and prednisone. Patient is following up with his primary care physician tomorrow for recheck of his left lower extremity cellulitis. I asked the patient to discuss hives with his primary care physician at that time and to assess the effectiveness of medication.  Patient encouraged to call [phone] immediately with trouble breathing, lip or tongue swelling. Encouraged patient to monitor his blood sugars carefully at home and to discontinue steroid if greater than 300. Patient encouraged to avoid  doxycycline in future unless other cause is determined.  11:45 PM Additional dose of hydroxyzine given prior to discharge as patient is still miserable.   MDM   Final diagnoses:  Urticaria   Patient with urticaria, no signs of anaphylaxis. No airway issues. Treated in emergency department with hydroxyzine and Decadron. Will start patient on prednisone taper tomorrow. Patient is a newly diagnosed type II diabetic. He has had not had difficulty with extremely high blood sugar in the past and I feel that it is safe to treat with steroids at this time, however patient will need to have close monitoring of his blood sugar at home and by his PCP. Patient's left lower extremity cellulitis is improving but not entirely resolved. No indications to restart antibiotics at this time, patient will follow-up with his PCP tomorrow for further evaluation.   Carlisle Cater, PA-C 04/26/14 2346  Quintella Reichert, MD 04/27/14 [phone]

## 2014-04-26 NOTE — Discharge Instructions (Signed)
Please read and follow all provided instructions.  Your diagnoses today include:  1. Urticaria     Tests performed today include:  Vital signs. See below for your results today.   Medications prescribed:   Prednisone - steroid medicine   It is best to take this medication in the morning to prevent sleeping problems. If you are diabetic, monitor your blood sugar closely and stop taking Prednisone if blood sugar is over 300. Take with food to prevent stomach upset.    Hydroxyzine - antihistamine  You can find this medication over-the-counter.   This medication will make you drowsy. DO NOT drive or perform any activities that require you to be awake and alert if taking this.  Take any prescribed medications only as directed.  Home care instructions:   Follow any educational materials contained in this packet  Follow-up instructions: Please follow-up with your primary care provider tomorrow as planned for further evaluation of your symptoms.   Return instructions:   Please return to the Emergency Department if you experience worsening symptoms.   Call 9-1-1 immediately if you have an allergic reaction that involves your lips, mouth, throat or if you have any difficulty breathing. This is a life-threatening emergency.   Please return if you have any other emergent concerns.  Additional Information:  Your vital signs today were: BP 145/85 mmHg   Pulse 106   Temp(Src) 98.3 F (36.8 C) (Oral)   Resp 20   SpO2 94% If your blood pressure (BP) was elevated above 135/85 this visit, please have this repeated by your doctor within one month. --------------

## 2015-01-25 ENCOUNTER — Other Ambulatory Visit: Payer: Self-pay | Admitting: Family

## 2015-01-25 ENCOUNTER — Ambulatory Visit
Admission: RE | Admit: 2015-01-25 | Discharge: 2015-01-25 | Disposition: A | Discharge status: Home / Self Care | Payer: Self-pay | Source: Ambulatory Visit | Attending: Family | Admitting: Family

## 2015-01-25 DIAGNOSIS — M542 Cervicalgia: Secondary | ICD-10-CM

## 2015-04-05 ENCOUNTER — Other Ambulatory Visit: Payer: Self-pay | Admitting: Orthopedic Surgery

## 2015-04-05 DIAGNOSIS — M5412 Radiculopathy, cervical region: Secondary | ICD-10-CM

## 2015-04-05 DIAGNOSIS — Z135 Encounter for screening for eye and ear disorders: Secondary | ICD-10-CM

## 2015-04-13 ENCOUNTER — Ambulatory Visit
Admission: RE | Admit: 2015-04-13 | Discharge: 2015-04-13 | Disposition: A | Discharge status: Home / Self Care | Payer: Medicare Other | Source: Ambulatory Visit | Attending: Orthopedic Surgery | Admitting: Orthopedic Surgery

## 2015-04-13 DIAGNOSIS — M5412 Radiculopathy, cervical region: Secondary | ICD-10-CM

## 2015-04-13 DIAGNOSIS — Z135 Encounter for screening for eye and ear disorders: Secondary | ICD-10-CM

## 2016-01-25 ENCOUNTER — Other Ambulatory Visit: Payer: Self-pay | Admitting: Orthopedic Surgery

## 2016-01-25 DIAGNOSIS — M542 Cervicalgia: Secondary | ICD-10-CM

## 2016-02-12 ENCOUNTER — Inpatient Hospital Stay
Admission: RE | Admit: 2016-02-12 | Discharge: 2016-02-12 | Disposition: A | Discharge status: Home / Self Care | Payer: Medicare Other | Source: Ambulatory Visit | Attending: Orthopedic Surgery | Admitting: Orthopedic Surgery

## 2016-02-12 NOTE — Discharge Instructions (Signed)

## 2016-02-26 ENCOUNTER — Ambulatory Visit
Admission: RE | Admit: 2016-02-26 | Discharge: 2016-02-26 | Disposition: A | Discharge status: Home / Self Care | Payer: Medicare Other | Source: Ambulatory Visit | Attending: Orthopedic Surgery | Admitting: Orthopedic Surgery

## 2016-02-26 DIAGNOSIS — M542 Cervicalgia: Secondary | ICD-10-CM

## 2016-02-26 MED ORDER — TRIAMCINOLONE ACETONIDE 40 MG/ML IJ SUSP (RADIOLOGY)
60.0000 mg | Freq: Once | INTRAMUSCULAR | Status: AC
  Administered 2016-02-26: 60 mg via EPIDURAL

## 2016-02-26 MED ORDER — IOPAMIDOL (ISOVUE-M 300) INJECTION 61%
1.0000 mL | Freq: Once | INTRAMUSCULAR | Status: AC | PRN
  Administered 2016-02-26: 1 mL via EPIDURAL

## 2016-02-26 NOTE — Discharge Instructions (Signed)

## 2016-03-11 ENCOUNTER — Emergency Department (HOSPITAL_COMMUNITY): Payer: Medicare Other

## 2016-03-11 ENCOUNTER — Encounter (HOSPITAL_COMMUNITY): Payer: Self-pay | Admitting: Family Medicine

## 2016-03-11 ENCOUNTER — Emergency Department (HOSPITAL_COMMUNITY)
Admission: EM | Admit: 2016-03-11 | Discharge: 2016-03-12 | Disposition: A | Discharge status: Home / Self Care | Payer: Medicare Other | Attending: Emergency Medicine | Admitting: Emergency Medicine

## 2016-03-11 DIAGNOSIS — Z79899 Other long term (current) drug therapy: Secondary | ICD-10-CM | POA: Insufficient documentation

## 2016-03-11 DIAGNOSIS — I10 Essential (primary) hypertension: Secondary | ICD-10-CM | POA: Insufficient documentation

## 2016-03-11 DIAGNOSIS — R1013 Epigastric pain: Secondary | ICD-10-CM | POA: Insufficient documentation

## 2016-03-11 DIAGNOSIS — F17210 Nicotine dependence, cigarettes, uncomplicated: Secondary | ICD-10-CM | POA: Diagnosis not present

## 2016-03-11 LAB — HEPATIC FUNCTION PANEL
ALT: 25 U/L (ref 17–63)
AST: 19 U/L (ref 15–41)
Albumin: 4 g/dL (ref 3.5–5.0)
Alkaline Phosphatase: 64 U/L (ref 38–126)
Bilirubin, Direct: 0.1 mg/dL (ref 0.1–0.5)
Indirect Bilirubin: 0.8 mg/dL (ref 0.3–0.9)
Total Bilirubin: 0.9 mg/dL (ref 0.3–1.2)
Total Protein: 7.3 g/dL (ref 6.5–8.1)

## 2016-03-11 LAB — BASIC METABOLIC PANEL
Anion gap: 8 (ref 5–15)
BUN: 13 mg/dL (ref 6–20)
CO2: 26 mmol/L (ref 22–32)
Calcium: 8.8 mg/dL — ABNORMAL LOW (ref 8.9–10.3)
Chloride: 105 mmol/L (ref 101–111)
Creatinine, Ser: 0.84 mg/dL (ref 0.61–1.24)
GFR calc Af Amer: >60 mL/min (ref >60)
GFR calc non Af Amer: >60 mL/min (ref >60)
Glucose, Bld: 97 mg/dL (ref 65–99)
Potassium: 3.8 mmol/L (ref 3.5–5.1)
Sodium: 139 mmol/L (ref 135–145)

## 2016-03-11 LAB — CBC
HCT: 45.4 % (ref 39.0–52.0)
Hemoglobin: 15.7 g/dL (ref 13.0–17.0)
MCH: 33.7 pg (ref 26.0–34.0)
MCHC: 34.6 g/dL (ref 30.0–36.0)
MCV: 97.4 fL (ref 78.0–100.0)
Platelets: 428 10*3/uL — ABNORMAL HIGH (ref 150–400)
RBC: 4.66 MIL/uL (ref 4.22–5.81)
RDW: 13.5 % (ref 11.5–15.5)
WBC: 14.6 10*3/uL — ABNORMAL HIGH (ref 4.0–10.5)

## 2016-03-11 LAB — I-STAT TROPONIN, ED: Troponin i, poc: 0 ng/mL (ref 0.00–0.08)

## 2016-03-11 LAB — LIPASE, BLOOD: Lipase: 47 U/L (ref 11–51)

## 2016-03-11 LAB — D-DIMER, QUANTITATIVE: D-Dimer, Quant: 0.29 ug/mL-FEU (ref 0.00–0.50)

## 2016-03-11 MED ORDER — GI COCKTAIL ~~LOC~~
30.0000 mL | Freq: Once | ORAL | Status: AC
  Administered 2016-03-11: 30 mL via ORAL
  Filled 2016-03-11: qty 30

## 2016-03-11 NOTE — ED Notes (Signed)
[place] called, they are @ [facility] and will be over in about an hour

## 2016-03-11 NOTE — ED Triage Notes (Signed)
Patient is complaining of intermittent lower, mid chest pain/epigastric pain that started one week ago. Associated symptoms are shortness of breath, dizziness, lightheadedness, nausea, and weakness. Pt started back smoking in July and reports having elevated stress level than normal. Pt is constantly talking while triaging.

## 2016-03-12 ENCOUNTER — Emergency Department (HOSPITAL_COMMUNITY): Payer: Medicare Other

## 2016-03-12 LAB — I-STAT TROPONIN, ED: Troponin i, poc: 0.01 ng/mL (ref 0.00–0.08)

## 2016-03-12 MED ORDER — PANTOPRAZOLE SODIUM 40 MG PO TBEC: 40.0000 mg | DELAYED_RELEASE_TABLET | Freq: Every day | ORAL | 0 refills | Status: DC

## 2016-03-12 MED ORDER — FAMOTIDINE 20 MG PO TABS: 20.0000 mg | ORAL_TABLET | Freq: Two times a day (BID) | ORAL | 0 refills | Status: DC

## 2016-03-12 NOTE — ED Provider Notes (Signed)
[facility] DEPT Provider Note   CSN: [id number] Arrival date & time: 03/11/16  2001     History   Chief Complaint Chief Complaint  Patient presents with  . Chest Pain    HPI Prakash Markum is a 60 y.o. male.  HPI  60 year old male presents with epigastric pain on and off for about one week. He states it is a soreness in his upper abdomen. Nothing seems to make it come on and it comes and goes without any obvious etiology. There is no dizziness, nausea, chest pain, or shortness of breath. Does not worsen with eating. It seems to get better whenever he stands up and walks. Walking upstairs does not elicited. There is no leg swelling. He states he does travel frequently and over the last 1 week estimates he has driven [phone] miles. Pain is mild, has not taken anything for the symptoms.  Of note, triage note indicates that he has had shorts breath, dizziness, lightheadedness, nausea, and weakness. I was asked about these multiple times and he denies this.  Past Medical History:  Diagnosis Date  . Anxiety and depression   . Bipolar 1 disorder ([place])   . Hypercholesterolemia   . Hypertension     There are no active problems to display for this patient.   Past Surgical History:  Procedure Laterality Date  . L hand surgery    . R knee arthoroscopy    . SPLENECTOMY         Home Medications    Prior to Admission medications   Medication Sig Start Date End Date Taking? Authorizing Provider  acetaminophen (TYLENOL) 500 MG tablet Take 500 mg by mouth every 6 (six) hours as needed (headache).    Historical Provider, MD  atenolol (TENORMIN) 50 MG tablet Take 50 mg by mouth daily.      Historical Provider, MD  clorazepate (TRANXENE) 7.5 MG tablet Take 7.5 mg by mouth 3 (three) times daily.      Historical Provider, MD  famotidine (PEPCID) 20 MG tablet Take 1 tablet (20 mg total) by mouth 2 (two) times daily. 03/12/16   Sherwood Gambler, MD  hydrOXYzine (ATARAX/VISTARIL) 25 MG tablet  Take 1 tablet (25 mg total) by mouth every 6 (six) hours. 04/26/14   Carlisle Cater, PA-C  levofloxacin (LEVAQUIN) 750 MG tablet Take 1 tablet (750 mg total) by mouth daily. X 7 days 11/26/13   Shanon Rosser, MD  meclizine (ANTIVERT) 25 MG tablet Take 1 tablet (25 mg total) by mouth 3 (three) times daily as needed. 11/06/13   Merryl Hacker, MD  Omega-3 Fatty Acids (OMEGA 3 PO) Take 1 capsule by mouth daily.    Historical Provider, MD  pantoprazole (PROTONIX) 40 MG tablet Take 1 tablet (40 mg total) by mouth daily. 03/12/16   Sherwood Gambler, MD  predniSONE (DELTASONE) 20 MG tablet 3 Tabs PO Days 1-3, then 2 tabs PO Days 4-6, then 1 tab PO Day 7-9, then Half Tab PO Day 10-12 04/26/14   Carlisle Cater, PA-C  QUEtiapine (SEROQUEL) 200 MG tablet Take 1 tablet (200 mg total) by mouth at bedtime. 09/11/11 10/11/11  Virgel Manifold, MD  sertraline (ZOLOFT) 100 MG tablet Take 50 mg by mouth daily. Takes half of 100mg     Historical Provider, MD  sertraline (ZOLOFT) 25 MG tablet Take 1 tablet (25 mg total) by mouth daily. 09/11/11 09/10/12  Virgel Manifold, MD    Family History Family History  Problem Relation Age of Onset  . Cancer Mother  Social History Social History  Substance Use Topics  . Smoking status: Current Every Day Smoker    Packs/day: 1.00    Types: Cigarettes    Last attempt to quit: 04/05/2001  . Smokeless tobacco: Never Used  . Alcohol use No     Allergies   Patient has no known allergies.   Review of Systems Review of Systems  Constitutional: Negative for diaphoresis and fever.  Respiratory: Negative for shortness of breath.   Cardiovascular: Negative for chest pain and leg swelling.  Gastrointestinal: Positive for abdominal pain. Negative for nausea and vomiting.  All other systems reviewed and are negative.    Physical Exam Updated Vital Signs BP 150/64 (BP Location: Right Arm)   Pulse 69   Temp 97.8 F (36.6 C)   Resp 20   Ht 5\' 9"  (1.753 m)   Wt 230 lb (104.3 kg)    SpO2 99%   BMI 33.97 kg/m   Physical Exam  Constitutional: He is oriented to person, place, and time. He appears well-developed and well-nourished. No distress.  HENT:  Head: Normocephalic and atraumatic.  Right Ear: External ear normal.  Left Ear: External ear normal.  Nose: Nose normal.  Eyes: Right eye exhibits no discharge. Left eye exhibits no discharge.  Neck: Neck supple.  Cardiovascular: Normal rate, regular rhythm and normal heart sounds.   Pulmonary/Chest: Effort normal and breath sounds normal.  Abdominal: Soft. There is tenderness (mild) in the epigastric area.  Musculoskeletal: He exhibits no edema.  Neurological: He is alert and oriented to person, place, and time.  Skin: Skin is warm and dry. He is not diaphoretic.  Nursing note and vitals reviewed.    ED Treatments / Results  Labs (all labs ordered are listed, but only abnormal results are displayed) Labs Reviewed  BASIC METABOLIC PANEL - Abnormal; Notable for the following:       Result Value   Calcium 8.8 (*)    All other components within normal limits  CBC - Abnormal; Notable for the following:    WBC 14.6 (*)    Platelets 428 (*)    All other components within normal limits  HEPATIC FUNCTION PANEL  LIPASE, BLOOD  D-DIMER, QUANTITATIVE (NOT AT [facility])  Randolm Idol, ED  Randolm Idol, ED    EKG  EKG Interpretation  Date/Time:  Monday March 11 2016 20:19:21 EST Ventricular Rate:  72 PR Interval:    QRS Duration: 92 QT Interval:  392 QTC Calculation: 429 R Axis:   -10 Text Interpretation:  Sinus rhythm Baseline wander in lead(s) V1 no significant change since Aug 2015 Confirmed by [facility] MD, [place] [phone]) on 03/11/2016 10:39:41 PM       EKG Interpretation  Date/Time:  Monday March 11 2016 22:53:58 EST Ventricular Rate:  61 PR Interval:    QRS Duration: 95 QT Interval:  430 QTC Calculation: 434 R Axis:   -17 Text Interpretation:  Sinus rhythm Borderline left axis deviation  Probable anteroseptal infarct, old no significant change since earlier in the day Confirmed by [facility] MD, [place] [phone]) on 03/12/2016 12:14:10 AM       Radiology Dg Chest 2 View  Result Date: 03/11/2016 CLINICAL DATA:  Chest pain for 1 week EXAM: CHEST  2 VIEW COMPARISON:  09/09/2010 FINDINGS: The heart size and mediastinal contours are within normal limits. Both lungs are clear. The visualized skeletal structures are unremarkable. Postsurgical changes are noted in the left upper abdomen. IMPRESSION: No active cardiopulmonary disease. Electronically Signed   By:  Inez Catalina M.D.   On: 03/11/2016 21:31   US Abdomen Limited Ruq  Result Date: 03/12/2016 CLINICAL DATA:  60 y/o M; right upper quadrant and epigastric pain for 1 week. EXAM: US ABDOMEN LIMITED - RIGHT UPPER QUADRANT COMPARISON:  None. FINDINGS: Gallbladder: Negative sonographic Murphy sign. No gallstones, pericholecystic fluid, or gallbladder wall thickening. Ring down artifact from the gallbladder wall probably represents adenomyomatosis. Common bile duct: Diameter: 4.8 mm Liver: No focal lesion identified. Within normal limits in parenchymal echogenicity. IMPRESSION: 1. No acute process identified. 2. Gallbladder adenomyomatosis. Electronically Signed   By: Kristine Garbe M.D.   On: 03/12/2016 01:10    Procedures Procedures (including critical care time)  Medications Ordered in ED Medications  gi cocktail (Maalox,Lidocaine,Donnatal) (30 mLs Oral Given 03/11/16 2342)     Initial Impression / Assessment and Plan / ED Course  I have reviewed the triage vital signs and the nursing notes.  Pertinent labs & imaging results that were available during my care of the patient were reviewed by me and considered in my medical decision making (see chart for details).  Clinical Course as of Mar 13 119  Mon Mar 11, 2016  2257 History is quite atypical. No exertional component. Will check ddimer given he is traveling long  distances but my suspicion for PE, ACS, dissection are low. Given mild epigastric tenderness, consider GI source, will get RUQ u/s and LFTs, lipase. GI cocktail  [SG]    Clinical Course User Index [SG] Sherwood Gambler, MD    I believe the patient's pain is GI in etiology. He does feel better after a GI cocktail. 2 negative troponins. While the second troponin is 0.01 and the first was 0.0, think this is within lab error. I highly doubt ACS. Ultrasound unremarkable of his right upper quadrant. I have discussed strict return per cautions and the need for a PCP follow-up.  Final Clinical Impressions(s) / ED Diagnoses   Final diagnoses:  Epigastric pain    New Prescriptions New Prescriptions   FAMOTIDINE (PEPCID) 20 MG TABLET    Take 1 tablet (20 mg total) by mouth 2 (two) times daily.   PANTOPRAZOLE (PROTONIX) 40 MG TABLET    Take 1 tablet (40 mg total) by mouth daily.     Sherwood Gambler, MD 03/12/16 [phone]

## 2016-05-01 ENCOUNTER — Other Ambulatory Visit: Payer: Self-pay | Admitting: Orthopedic Surgery

## 2016-05-01 DIAGNOSIS — M542 Cervicalgia: Secondary | ICD-10-CM

## 2016-05-06 ENCOUNTER — Ambulatory Visit
Admission: RE | Admit: 2016-05-06 | Discharge: 2016-05-06 | Disposition: A | Discharge status: Home / Self Care | Payer: PPO | Source: Ambulatory Visit | Attending: Orthopedic Surgery | Admitting: Orthopedic Surgery

## 2016-05-06 DIAGNOSIS — M5412 Radiculopathy, cervical region: Secondary | ICD-10-CM | POA: Diagnosis not present

## 2016-05-06 DIAGNOSIS — M542 Cervicalgia: Secondary | ICD-10-CM

## 2016-05-06 MED ORDER — IOPAMIDOL (ISOVUE-M 300) INJECTION 61%
1.0000 mL | Freq: Once | INTRAMUSCULAR | Status: AC | PRN
  Administered 2016-05-06: 1 mL via EPIDURAL

## 2016-05-06 MED ORDER — TRIAMCINOLONE ACETONIDE 40 MG/ML IJ SUSP (RADIOLOGY)
60.0000 mg | Freq: Once | INTRAMUSCULAR | Status: AC
  Administered 2016-05-06: 60 mg via EPIDURAL

## 2016-05-06 NOTE — Discharge Instructions (Signed)

## 2016-06-13 ENCOUNTER — Other Ambulatory Visit: Payer: Self-pay | Admitting: Orthopedic Surgery

## 2016-06-13 DIAGNOSIS — M542 Cervicalgia: Secondary | ICD-10-CM

## 2016-06-15 ENCOUNTER — Emergency Department (HOSPITAL_COMMUNITY): Payer: PPO

## 2016-06-15 ENCOUNTER — Emergency Department (HOSPITAL_COMMUNITY)
Admission: EM | Admit: 2016-06-15 | Discharge: 2016-06-15 | Disposition: A | Discharge status: Home / Self Care | Payer: PPO | Attending: Emergency Medicine | Admitting: Emergency Medicine

## 2016-06-15 ENCOUNTER — Encounter (HOSPITAL_COMMUNITY): Payer: Self-pay | Admitting: *Deleted

## 2016-06-15 DIAGNOSIS — I10 Essential (primary) hypertension: Secondary | ICD-10-CM | POA: Insufficient documentation

## 2016-06-15 DIAGNOSIS — R222 Localized swelling, mass and lump, trunk: Secondary | ICD-10-CM | POA: Diagnosis not present

## 2016-06-15 DIAGNOSIS — Z79899 Other long term (current) drug therapy: Secondary | ICD-10-CM | POA: Diagnosis not present

## 2016-06-15 DIAGNOSIS — F17210 Nicotine dependence, cigarettes, uncomplicated: Secondary | ICD-10-CM | POA: Insufficient documentation

## 2016-06-15 DIAGNOSIS — R600 Localized edema: Secondary | ICD-10-CM | POA: Diagnosis not present

## 2016-06-15 LAB — I-STAT CHEM 8, ED
BUN: 13 mg/dL (ref 6–20)
Calcium, Ion: 1.13 mmol/L — ABNORMAL LOW (ref 1.15–1.40)
Chloride: 105 mmol/L (ref 101–111)
Creatinine, Ser: 0.8 mg/dL (ref 0.61–1.24)
Glucose, Bld: 131 mg/dL — ABNORMAL HIGH (ref 65–99)
HCT: 48 % (ref 39.0–52.0)
Hemoglobin: 16.3 g/dL (ref 13.0–17.0)
Potassium: 3.9 mmol/L (ref 3.5–5.1)
Sodium: 143 mmol/L (ref 135–145)
TCO2: 28 mmol/L (ref 0–100)

## 2016-06-15 LAB — TROPONIN I: Troponin I: <0.03 ng/mL (ref <0.03)

## 2016-06-15 LAB — CBC
HCT: 44.4 % (ref 39.0–52.0)
Hemoglobin: 14.4 g/dL (ref 13.0–17.0)
MCH: 32.1 pg (ref 26.0–34.0)
MCHC: 32.4 g/dL (ref 30.0–36.0)
MCV: 98.9 fL (ref 78.0–100.0)
Platelets: 429 10*3/uL — ABNORMAL HIGH (ref 150–400)
RBC: 4.49 MIL/uL (ref 4.22–5.81)
RDW: 14 % (ref 11.5–15.5)
WBC: 8.8 10*3/uL (ref 4.0–10.5)

## 2016-06-15 MED ORDER — FUROSEMIDE 10 MG/ML IJ SOLN
20.0000 mg | Freq: Once | INTRAMUSCULAR | Status: AC
  Administered 2016-06-15: 20 mg via INTRAVENOUS
  Filled 2016-06-15: qty 4

## 2016-06-15 MED ORDER — ATENOLOL 50 MG PO TABS: 50.0000 mg | ORAL_TABLET | Freq: Every day | ORAL | 0 refills | Status: DC

## 2016-06-15 MED ORDER — FUROSEMIDE 20 MG PO TABS: 20.0000 mg | ORAL_TABLET | Freq: Every day | ORAL | 0 refills | Status: DC

## 2016-06-15 MED ORDER — ATENOLOL 50 MG PO TABS
50.0000 mg | ORAL_TABLET | Freq: Every day | ORAL | Status: DC
  Administered 2016-06-15: 50 mg via ORAL
  Filled 2016-06-15: qty 1

## 2016-06-15 NOTE — ED Provider Notes (Signed)
  Face-to-face evaluation   History: Complaints of chronic leg swelling.  He states his PCP took him off of Lasix.  He is also out of his medications for hypertension.  Physical exam: Unkempt, alert, cooperative.  Lungs clear to auscultation.  Legs with moderate edema lower, with mild anterior erythema, nonspecific.  Medical screening examination/treatment/procedure(s) were conducted as a shared visit with non-physician practitioner(s) and myself.  I personally evaluated the patient during the encounter    Daleen Bo, MD 06/15/16 [phone]

## 2016-06-15 NOTE — Discharge Instructions (Signed)
Please read and follow all provided instructions.  Your diagnoses today include:  1. Hypertension, unspecified type     Tests performed today include: Vital signs. See below for your results today.   Medications prescribed:  Take as prescribed   Home care instructions:  Follow any educational materials contained in this packet.  Follow-up instructions: Please follow-up with your primary care provider for further evaluation of symptoms and treatment   Return instructions:  Please return to the Emergency Department if you do not get better, if you get worse, or new symptoms OR  - Fever (temperature greater than 101.25F)  - Bleeding that does not stop with holding pressure to the area    -Severe pain (please note that you may be more sore the day after your accident)  - Chest Pain  - Difficulty breathing  - Severe nausea or vomiting  - Inability to tolerate food and liquids  - Passing out  - Skin becoming red around your wounds  - Change in mental status (confusion or lethargy)  - New numbness or weakness    Please return if you have any other emergent concerns.  Additional Information:  Your vital signs today were: BP (!) 181/101 (BP Location: Right Arm)    Pulse 77    Temp 97.4 F (36.3 C) (Oral)    Resp 17    Ht 5\' 9"  (1.753 m)    Wt 102.1 kg    SpO2 98%    BMI 33.23 kg/m  If your blood pressure (BP) was elevated above 135/85 this visit, please have this repeated by your doctor within one month. ---------------

## 2016-06-15 NOTE — ED Triage Notes (Addendum)
Pt reports HTN, out of meds for about a month, states he also has congestion and indigestion. Pt has noted pedal edema

## 2016-06-15 NOTE — ED Provider Notes (Signed)
[facility] DEPT Provider Note   CSN: [id number] Arrival date & time: 06/15/16  7939     History   Chief Complaint Chief Complaint  Patient presents with  . Hypertension  . URI    HPI Kenneth [name] is a 61 y.o. male.  HPI  61 y.o. male with a hx of HTN, Anxiety, Bipolar Disorder, presents to the Emergency Department today complaining of high blood pressure. Notes out of medication x 1 month. No associated CP/SOB/ABD pain. No dyspnea with exertion. No N/V. No diaphoresis. Pt takes 100mg  Atenolol daily for 13 years. In between PCPs. No fevers. Pt also noted bilateral leg swelling since the beginning of the year. Per patient, echo in the past was unremarkable. Used to be on fluid pills, but discontinued by PCP. No other symptoms noted.     Past Medical History:  Diagnosis Date  . Anxiety and depression   . Bipolar 1 disorder ([place])   . Hypercholesterolemia   . Hypertension     There are no active problems to display for this patient.   Past Surgical History:  Procedure Laterality Date  . L hand surgery    . R knee arthoroscopy    . SPLENECTOMY         Home Medications    Prior to Admission medications   Medication Sig Start Date End Date Taking? Authorizing Provider  acetaminophen (TYLENOL) 500 MG tablet Take 500 mg by mouth every 6 (six) hours as needed (headache).    Historical Provider, MD  atenolol (TENORMIN) 50 MG tablet Take 50 mg by mouth daily.      Historical Provider, MD  clorazepate (TRANXENE) 7.5 MG tablet Take 7.5 mg by mouth 3 (three) times daily.      Historical Provider, MD  famotidine (PEPCID) 20 MG tablet Take 1 tablet (20 mg total) by mouth 2 (two) times daily. 03/12/16   Sherwood Gambler, MD  hydrOXYzine (ATARAX/VISTARIL) 25 MG tablet Take 1 tablet (25 mg total) by mouth every 6 (six) hours. 04/26/14   Carlisle Cater, PA-C  levofloxacin (LEVAQUIN) 750 MG tablet Take 1 tablet (750 mg total) by mouth daily. X 7 days 11/26/13   Shanon Rosser, MD  meclizine  (ANTIVERT) 25 MG tablet Take 1 tablet (25 mg total) by mouth 3 (three) times daily as needed. 11/06/13   Merryl Hacker, MD  Omega-3 Fatty Acids (OMEGA 3 PO) Take 1 capsule by mouth daily.    Historical Provider, MD  pantoprazole (PROTONIX) 40 MG tablet Take 1 tablet (40 mg total) by mouth daily. 03/12/16   Sherwood Gambler, MD  predniSONE (DELTASONE) 20 MG tablet 3 Tabs PO Days 1-3, then 2 tabs PO Days 4-6, then 1 tab PO Day 7-9, then Half Tab PO Day 10-12 04/26/14   Carlisle Cater, PA-C  QUEtiapine (SEROQUEL) 200 MG tablet Take 1 tablet (200 mg total) by mouth at bedtime. 09/11/11 10/11/11  Virgel Manifold, MD  sertraline (ZOLOFT) 100 MG tablet Take 50 mg by mouth daily. Takes half of 100mg     Historical Provider, MD  sertraline (ZOLOFT) 25 MG tablet Take 1 tablet (25 mg total) by mouth daily. 09/11/11 09/10/12  Virgel Manifold, MD    Family History Family History  Problem Relation Age of Onset  . Cancer Mother     Social History Social History  Substance Use Topics  . Smoking status: Current Every Day Smoker    Packs/day: 1.00    Types: Cigarettes    Last attempt to quit: 04/05/2001  .  Smokeless tobacco: Never Used  . Alcohol use No     Allergies   Patient has no known allergies.   Review of Systems Review of Systems ROS reviewed and all are negative for acute change except as noted in the HPI.  Physical Exam Updated Vital Signs BP (!) 181/101 (BP Location: Right Arm)   Pulse 77   Temp 97.4 F (36.3 C) (Oral)   Resp 17   Ht 5\' 9"  (1.753 m)   Wt 102.1 kg   SpO2 98%   BMI 33.23 kg/m   Physical Exam  Constitutional: He is oriented to person, place, and time. Vital signs are normal. He appears well-developed and well-nourished.  HENT:  Head: Normocephalic and atraumatic.  Right Ear: Hearing normal.  Left Ear: Hearing normal.  Eyes: Conjunctivae and EOM are normal. Pupils are equal, round, and reactive to light.  Neck: Normal range of motion. Neck supple.  Cardiovascular:  Normal rate, regular rhythm, normal heart sounds and intact distal pulses.   No murmur heard. Pulmonary/Chest: Effort normal and breath sounds normal. No respiratory distress. He has no wheezes. He has no rales. He exhibits no tenderness.  Abdominal: Soft. There is no tenderness.  Musculoskeletal: Normal range of motion.  Bilateral pitting edema 2+ to shin. No signs of infection. NVI. Motor/sensation intact.   Neurological: He is alert and oriented to person, place, and time.  Skin: Skin is warm and dry.  Psychiatric: He has a normal mood and affect. His speech is normal and behavior is normal. Thought content normal.  Nursing note and vitals reviewed.  ED Treatments / Results  Labs (all labs ordered are listed, but only abnormal results are displayed) Labs Reviewed  CBC - Abnormal; Notable for the following:       Result Value   Platelets 429 (*)    All other components within normal limits  I-STAT CHEM 8, ED - Abnormal; Notable for the following:    Glucose, Bld 131 (*)    Calcium, Ion 1.13 (*)    All other components within normal limits  TROPONIN I    EKG  EKG Interpretation None       Radiology Dg Chest 2 View  Result Date: 06/15/2016 CLINICAL DATA:  Bilateral lower extremity swelling chronically worsening today. EXAM: CHEST  2 VIEW COMPARISON:  03/11/2016 FINDINGS: Lungs are adequately inflated without focal consolidation or effusion. Borderline stable cardiomegaly. Surgical clips just below the left hemidiaphragm. Minimal degenerate change of the spine. IMPRESSION: No acute cardiopulmonary disease. Electronically Signed   By: Marin Olp M.D.   On: 06/15/2016 09:46    Procedures Procedures (including critical care time)  Medications Ordered in ED Medications  atenolol (TENORMIN) tablet 50 mg (not administered)  furosemide (LASIX) injection 20 mg (not administered)     Initial Impression / Assessment and Plan / ED Course  I have reviewed the triage vital  signs and the nursing notes.  Pertinent labs & imaging results that were available during my care of the patient were reviewed by me and considered in my medical decision making (see chart for details).  Final Clinical Impressions(s) / ED Diagnoses  {I have reviewed and evaluated the relevant laboratory values. {I have reviewed and evaluated the relevant imaging studies. {I have interpreted the relevant EKG. {I have reviewed the relevant previous healthcare records.  {I obtained HPI from historian. {Patient discussed with supervising physician.  ED Course:  Assessment: Pt is a 61 y.o. male with hx HTN who presents with HTN  after being off of meds x 1 month. Takes Atenolol 50mg  daily. Denies CP/SOB/ABD pain. No dyspnea with exertion. No fevers or URI symptoms. Notes BLE swelling since January with hx same. Used to be on fluid pill, but DCed by PCP. PT states echo in past was unremarkable.  On exam, pt in NAD. Nontoxic/nonseptic appearing. VSS. Afebrile. Lungs CTA. Heart RRR. Abdomen nontender soft. BLE pitting edema +2. No erythema. NO signs of infection. Trop negative. EKG unremarkable. CXR unremarkable. Labs unremarkable. Given home BP med (Atenolol 50mg ) as well as Lasix 20mg  in ED. Plan is to DC home with follow up to PCP. Given Rx Atenolol as well as Lasix. At time of discharge, Patient is in no acute distress. Vital Signs are stable. Patient is able to ambulate. Patient able to tolerate PO.   Disposition/Plan:  DC Home Additional Verbal discharge instructions given and discussed with patient.  Pt Instructed to f/u with PCP in the next week for evaluation and treatment of symptoms. Return precautions given Pt acknowledges and agrees with plan  Supervising Physician Daleen Bo, MD  Final diagnoses:  Hypertension, unspecified type    New Prescriptions New Prescriptions   No medications on file     Shary Decamp, PA-C 06/15/16 [place], MD 06/15/16 [phone]

## 2016-06-26 ENCOUNTER — Ambulatory Visit (INDEPENDENT_AMBULATORY_CARE_PROVIDER_SITE_OTHER): Payer: PPO | Admitting: Primary Care

## 2016-06-26 ENCOUNTER — Encounter: Payer: Self-pay | Admitting: Primary Care

## 2016-06-26 VITALS — BP 156/98 | HR 78 | Temp 97.9°F | Ht 69.0 in | Wt 241.4 lb

## 2016-06-26 DIAGNOSIS — F329 Major depressive disorder, single episode, unspecified: Secondary | ICD-10-CM

## 2016-06-26 DIAGNOSIS — E785 Hyperlipidemia, unspecified: Secondary | ICD-10-CM | POA: Diagnosis not present

## 2016-06-26 DIAGNOSIS — M542 Cervicalgia: Secondary | ICD-10-CM | POA: Diagnosis not present

## 2016-06-26 DIAGNOSIS — F418 Other specified anxiety disorders: Secondary | ICD-10-CM | POA: Diagnosis not present

## 2016-06-26 DIAGNOSIS — I10 Essential (primary) hypertension: Secondary | ICD-10-CM

## 2016-06-26 DIAGNOSIS — E119 Type 2 diabetes mellitus without complications: Secondary | ICD-10-CM | POA: Diagnosis not present

## 2016-06-26 DIAGNOSIS — G8929 Other chronic pain: Secondary | ICD-10-CM

## 2016-06-26 DIAGNOSIS — F419 Anxiety disorder, unspecified: Secondary | ICD-10-CM

## 2016-06-26 DIAGNOSIS — F32A Depression, unspecified: Secondary | ICD-10-CM | POA: Insufficient documentation

## 2016-06-26 MED ORDER — HYDROCHLOROTHIAZIDE 25 MG PO TABS: 25.0000 mg | ORAL_TABLET | Freq: Every day | ORAL | 0 refills | Status: DC

## 2016-06-26 MED ORDER — SERTRALINE HCL 50 MG PO TABS: 50.0000 mg | ORAL_TABLET | Freq: Every day | ORAL | 0 refills | Status: DC

## 2016-06-26 NOTE — Assessment & Plan Note (Signed)
Endorses prior history. Lipids pending. No current statin treatment which would be warranted given diabetes and HTN.  Await results.

## 2016-06-26 NOTE — Patient Instructions (Signed)
Blood Pressure:  Continue Atenolol 50 mg for high blood pressure.  Start hydrochlorothiazide 25 mg tablets for high blood pressure. Take 1 tablet by mouth once daily.  Stop taking furosemide.  Anxiety and Depression:  I sent sertraline 50 mg tablets into your pharmacy so you don't have to cut them in half.   Come in fasting tomorrow for cholesterol and diabetes check.  Schedule a follow up visit with me in 3 weeks for BP check.   It was a pleasure to meet you today! Please don't hesitate to call me with any questions. Welcome to [facility]!

## 2016-06-26 NOTE — Assessment & Plan Note (Signed)
Above goal today on Atenolol 50 mg. Will initiate HCTZ 25 mg. Will have him stop furosemide. He will monitor BP for the next several weeks. Follow up in three weeks for BP check.

## 2016-06-26 NOTE — Progress Notes (Signed)
Pre visit review using our clinic review tool, if applicable. No additional management support is needed unless otherwise documented below in the visit note. 

## 2016-06-26 NOTE — Progress Notes (Signed)
Subjective:    Patient ID: Kenneth [name], male    DOB: Jan 14, 1956, 61 y.o.   MRN: [id number]  HPI  Kenneth [name] is a 61 year old male who presents today to establish care and discuss the problems mentioned below. Will obtain old records.  1) Anxiety/Depression/Bipolar Disorder: Previously managed on Zoloft 100 mg and clorazepate 7.5 mg. He's not had either of these medications in 2 months since he lost his medications. Since he's been off of his Zoloft he's noticed more irritability, racing thoughts, cloudy thinking, anxiety. He was only taking 1/2 tablet daily as he felt "bad" on the 100 mg dose. He denies SI/HI.  2) Essential Hypertension: Long history of hypertension and previously managed on Atenolol 100 mg. He presented to [facility] on 06/15/16 reporting hypertension as he had been out of his Atenolol for the past 1 month. His BP in the ED was 181/101.  During his stay in the ED he was noted to have bilateral lower extremity edema with 2+ pitting. His labs were concerning for diabetes. He was discharged home later that day with a prescription for Atenolol 50 mg and Lasix 20 mg.  His BP in the office today is 156/98. He is compliant to his Atenolol and Lasix. He was provided with compression stockings which he's been compliant to. He's been checking his BP at [facility] before his trip to the ED which was 200/100. Overall he's feeling better since restarting his Atenolol as he felt lightheaded before.   3) Type 2 Diabetes: Diagnosed several years ago and was placed on Metformin. He stopped taking the Metformin 2 months ago as he lost his medication. His last A1C was about three months ago but doesn't remember his reading. His numbers were always stable on the Metformin.   4) Muscle Spasms: Located to shoulders and neck. Currently following with [facility] and is undergoing injections. He's also managed on Robaxin.   Review of Systems  Constitutional: Negative for fatigue.  Eyes: Negative for  visual disturbance.  Respiratory: Negative for shortness of breath.   Cardiovascular: Positive for leg swelling. Negative for chest pain.  Endocrine: Negative for polyuria.  Musculoskeletal: Positive for arthralgias and myalgias.  Allergic/Immunologic: Negative for environmental allergies.  Neurological: Negative for dizziness and headaches.  Hematological: Negative for adenopathy.  Psychiatric/Behavioral: Negative for sleep disturbance and suicidal ideas. The patient is nervous/anxious.        Past Medical History:  Diagnosis Date  . Anxiety and depression   . Bipolar 1 disorder ([place])   . Hypercholesterolemia   . Hypertension   . Type 2 diabetes mellitus ([place])      Social History   Social History  . Marital status: Divorced    Spouse name: N/A  . Number of children: N/A  . Years of education: N/A   Occupational History  . Not on file.   Social History Main Topics  . Smoking status: Current Every Day Smoker    Packs/day: 1.00    Types: Cigarettes    Last attempt to quit: 04/05/2001  . Smokeless tobacco: Never Used  . Alcohol use No  . Drug use: No  . Sexual activity: Not on file   Other Topics Concern  . Not on file   Social History Narrative   Single.   1 daughter, 1 grandchild.   Retired. Once worked in [employer].   Enjoys sports, racing, traveling.        Past Surgical History:  Procedure Laterality Date  . L hand  surgery    . R knee arthoroscopy    . SPLENECTOMY      Family History  Problem Relation Age of Onset  . Hypertension Mother   . Mental illness Mother   . Diabetes Mother   . Hypertension Father   . Lung cancer Father   . Diabetes Paternal Grandmother     No Known Allergies  Current Outpatient Prescriptions on File Prior to Visit  Medication Sig Dispense Refill  . atenolol (TENORMIN) 50 MG tablet Take 1 tablet (50 mg total) by mouth daily. 30 tablet 0  . acetaminophen (TYLENOL) 500 MG tablet Take 500 mg by mouth every 6 (six) hours as  needed (headache).    . Omega-3 Fatty Acids (OMEGA 3 PO) Take 1 capsule by mouth daily.     No current facility-administered medications on file prior to visit.     BP (!) 156/98   Pulse 78   Temp 97.9 F (36.6 C) (Oral)   Ht 5\' 9"  (1.753 m)   Wt 241 lb 6.4 oz (109.5 kg)   SpO2 94%   BMI 35.65 kg/m    Objective:   Physical Exam  Constitutional: He is oriented to person, place, and time. He appears well-nourished.  Neck: Neck supple.  Cardiovascular: Normal rate and regular rhythm.   Pulmonary/Chest: Effort normal and breath sounds normal. He has no wheezes. He has no rales.  Musculoskeletal: Normal range of motion.  Neurological: He is alert and oriented to person, place, and time.  Skin: Skin is warm and dry.  Psychiatric: He has a normal mood and affect.          Assessment & Plan:  All hospital imaging, labs, and notes reviewed.

## 2016-06-26 NOTE — Assessment & Plan Note (Signed)
Also with shoulder involvement. Following with ortho. Continue methocarbamol and injections.

## 2016-06-26 NOTE — Assessment & Plan Note (Signed)
Did well on Zoloft 50 mg, refill provided today. Denies SI/HI.

## 2016-06-26 NOTE — Assessment & Plan Note (Signed)
Previously managed on metformin, no meds in 2 months. A1C ,lipids, urine microalbumin pending.

## 2016-06-27 ENCOUNTER — Ambulatory Visit
Admission: RE | Admit: 2016-06-27 | Discharge: 2016-06-27 | Disposition: A | Discharge status: Home / Self Care | Payer: PPO | Source: Ambulatory Visit | Attending: Orthopedic Surgery | Admitting: Orthopedic Surgery

## 2016-06-27 ENCOUNTER — Other Ambulatory Visit (INDEPENDENT_AMBULATORY_CARE_PROVIDER_SITE_OTHER): Payer: PPO

## 2016-06-27 ENCOUNTER — Other Ambulatory Visit: Payer: PPO

## 2016-06-27 DIAGNOSIS — E119 Type 2 diabetes mellitus without complications: Secondary | ICD-10-CM

## 2016-06-27 DIAGNOSIS — E785 Hyperlipidemia, unspecified: Secondary | ICD-10-CM | POA: Diagnosis not present

## 2016-06-27 DIAGNOSIS — M542 Cervicalgia: Secondary | ICD-10-CM

## 2016-06-27 LAB — LIPID PANEL
Cholesterol: 195 mg/dL (ref <200)
HDL: 33 mg/dL — ABNORMAL LOW (ref >40)
LDL Cholesterol: 129 mg/dL — ABNORMAL HIGH (ref <100)
Total CHOL/HDL Ratio: 5.9 Ratio — ABNORMAL HIGH (ref <5.0)
Triglycerides: 167 mg/dL — ABNORMAL HIGH (ref <150)
VLDL: 33 mg/dL — ABNORMAL HIGH (ref <30)

## 2016-06-27 MED ORDER — TRIAMCINOLONE ACETONIDE 40 MG/ML IJ SUSP (RADIOLOGY)
60.0000 mg | Freq: Once | INTRAMUSCULAR | Status: AC
  Administered 2016-06-27: 60 mg via EPIDURAL

## 2016-06-27 MED ORDER — IOPAMIDOL (ISOVUE-M 300) INJECTION 61%
1.0000 mL | Freq: Once | INTRAMUSCULAR | Status: AC | PRN
  Administered 2016-06-27: 1 mL via EPIDURAL

## 2016-06-27 NOTE — Discharge Instructions (Signed)

## 2016-06-27 NOTE — Addendum Note (Signed)
Addended by: Ellamae Sia on: 06/27/2016 04:25 PM   Modules accepted: Orders

## 2016-06-28 LAB — MICROALBUMIN / CREATININE URINE RATIO
Creatinine, Urine: 123 mg/dL (ref 20–370)
Microalb Creat Ratio: 85 mcg/mg creat — ABNORMAL HIGH (ref <30)
Microalb, Ur: 10.4 mg/dL

## 2016-06-28 LAB — HEMOGLOBIN A1C
Hgb A1c MFr Bld: 5.4 % (ref <5.7)
Mean Plasma Glucose: 108 mg/dL

## 2016-07-22 ENCOUNTER — Encounter: Payer: Self-pay | Admitting: Primary Care

## 2016-07-22 ENCOUNTER — Ambulatory Visit (INDEPENDENT_AMBULATORY_CARE_PROVIDER_SITE_OTHER): Payer: PPO | Admitting: Primary Care

## 2016-07-22 DIAGNOSIS — I10 Essential (primary) hypertension: Secondary | ICD-10-CM

## 2016-07-22 MED ORDER — HYDROCHLOROTHIAZIDE 25 MG PO TABS: 25.0000 mg | ORAL_TABLET | Freq: Every day | ORAL | 1 refills | Status: DC

## 2016-07-22 MED ORDER — LISINOPRIL 10 MG PO TABS: 10.0000 mg | ORAL_TABLET | Freq: Every day | ORAL | 0 refills | Status: DC

## 2016-07-22 MED ORDER — ATENOLOL 50 MG PO TABS: 50.0000 mg | ORAL_TABLET | Freq: Every day | ORAL | 1 refills | Status: DC

## 2016-07-22 NOTE — Progress Notes (Signed)
Pre visit review using our clinic review tool, if applicable. No additional management support is needed unless otherwise documented below in the visit note. 

## 2016-07-22 NOTE — Patient Instructions (Signed)
Continue hydrochlorothiazide 25 mg tablets for blood pressure.  Continue atenolol 50 mg tablets for blood pressure.  Start lisinopril 10 mg tablets for blood pressure. Take 1 tablet by mouth once daily.  Follow up in 3 weeks for blood pressure check.  It was a pleasure to see you today!   DASH Eating Plan DASH stands for "Dietary Approaches to Stop Hypertension." The DASH eating plan is a healthy eating plan that has been shown to reduce high blood pressure (hypertension). It may also reduce your risk for type 2 diabetes, heart disease, and stroke. The DASH eating plan may also help with weight loss. What are tips for following this plan? General guidelines   Avoid eating more than 2,300 mg (milligrams) of salt (sodium) a day. If you have hypertension, you may need to reduce your sodium intake to 1,500 mg a day.  Limit alcohol intake to no more than 1 drink a day for nonpregnant women and 2 drinks a day for men. One drink equals 12 oz of beer, 5 oz of wine, or 1 oz of hard liquor.  Work with your health care provider to maintain a healthy body weight or to lose weight. Ask what an ideal weight is for you.  Get at least 30 minutes of exercise that causes your heart to beat faster (aerobic exercise) most days of the week. Activities may include walking, swimming, or biking.  Work with your health care provider or diet and nutrition specialist (dietitian) to adjust your eating plan to your individual calorie needs. Reading food labels   Check food labels for the amount of sodium per serving. Choose foods with less than 5 percent of the Daily Value of sodium. Generally, foods with less than 300 mg of sodium per serving fit into this eating plan.  To find whole grains, look for the word "whole" as the first word in the ingredient list. Shopping   Buy products labeled as "low-sodium" or "no salt added."  Buy fresh foods. Avoid canned foods and premade or frozen meals. Cooking   Avoid  adding salt when cooking. Use salt-free seasonings or herbs instead of table salt or sea salt. Check with your health care provider or pharmacist before using salt substitutes.  Do not fry foods. Cook foods using healthy methods such as baking, boiling, grilling, and broiling instead.  Cook with heart-healthy oils, such as olive, canola, soybean, or sunflower oil. Meal planning    Eat a balanced diet that includes:  5 or more servings of fruits and vegetables each day. At each meal, try to fill half of your plate with fruits and vegetables.  Up to 6-8 servings of whole grains each day.  Less than 6 oz of lean meat, poultry, or fish each day. A 3-oz serving of meat is about the same size as a deck of cards. One egg equals 1 oz.  2 servings of low-fat dairy each day.  A serving of nuts, seeds, or beans 5 times each week.  Heart-healthy fats. Healthy fats called Omega-3 fatty acids are found in foods such as flaxseeds and coldwater fish, like sardines, salmon, and mackerel.  Limit how much you eat of the following:  Canned or prepackaged foods.  Food that is high in trans fat, such as fried foods.  Food that is high in saturated fat, such as fatty meat.  Sweets, desserts, sugary drinks, and other foods with added sugar.  Full-fat dairy products.  Do not salt foods before eating.  Try to  eat at least 2 vegetarian meals each week.  Eat more home-cooked food and less restaurant, buffet, and fast food.  When eating at a restaurant, ask that your food be prepared with less salt or no salt, if possible. What foods are recommended? The items listed may not be a complete list. Talk with your dietitian about what dietary choices are best for you. Grains  Whole-grain or whole-wheat bread. Whole-grain or whole-wheat pasta. Brown rice. [place]. Bulgur. Whole-grain and low-sodium cereals. Pita bread. Low-fat, low-sodium crackers. Whole-wheat flour tortillas. Vegetables  Fresh or  frozen vegetables (raw, steamed, roasted, or grilled). Low-sodium or reduced-sodium tomato and vegetable juice. Low-sodium or reduced-sodium tomato sauce and tomato paste. Low-sodium or reduced-sodium canned vegetables. Fruits  All fresh, dried, or frozen fruit. Canned fruit in natural juice (without added sugar). Meat and other protein foods  Skinless chicken or [place]. Ground chicken or [place]. Pork with fat trimmed off. Fish and seafood. Egg whites. Dried beans, peas, or lentils. Unsalted nuts, nut butters, and seeds. Unsalted canned beans. Lean cuts of beef with fat trimmed off. Low-sodium, lean deli meat. Dairy  Low-fat (1%) or fat-free (skim) milk. Fat-free, low-fat, or reduced-fat cheeses. Nonfat, low-sodium ricotta or cottage cheese. Low-fat or nonfat yogurt. Low-fat, low-sodium cheese. Fats and oils  Soft margarine without trans fats. Vegetable oil. Low-fat, reduced-fat, or light mayonnaise and salad dressings (reduced-sodium). Canola, safflower, olive, soybean, and sunflower oils. Avocado. Seasoning and other foods  Herbs. Spices. Seasoning mixes without salt. Unsalted popcorn and pretzels. Fat-free sweets. What foods are not recommended? The items listed may not be a complete list. Talk with your dietitian about what dietary choices are best for you. Grains  Baked goods made with fat, such as croissants, muffins, or some breads. Dry pasta or rice meal packs. Vegetables  Creamed or fried vegetables. Vegetables in a cheese sauce. Regular canned vegetables (not low-sodium or reduced-sodium). Regular canned tomato sauce and paste (not low-sodium or reduced-sodium). Regular tomato and vegetable juice (not low-sodium or reduced-sodium). Angie Fava. Olives. Fruits  Canned fruit in a light or heavy syrup. Fried fruit. Fruit in cream or butter sauce. Meat and other protein foods  Fatty cuts of meat. Ribs. Fried meat. Berniece Salines. Sausage. Bologna and other processed lunch meats. Salami. Fatback.  Hotdogs. Bratwurst. Salted nuts and seeds. Canned beans with added salt. Canned or smoked fish. Whole eggs or egg yolks. Chicken or [place] with skin. Dairy  Whole or 2% milk, cream, and half-and-half. Whole or full-fat cream cheese. Whole-fat or sweetened yogurt. Full-fat cheese. Nondairy creamers. Whipped toppings. Processed cheese and cheese spreads. Fats and oils  Butter. Stick margarine. Lard. Shortening. Ghee. Bacon fat. Tropical oils, such as coconut, palm kernel, or palm oil. Seasoning and other foods  Salted popcorn and pretzels. Onion salt, garlic salt, seasoned salt, table salt, and sea salt. Worcestershire sauce. Tartar sauce. Barbecue sauce. Teriyaki sauce. Soy sauce, including reduced-sodium. Steak sauce. Canned and packaged gravies. Fish sauce. Oyster sauce. Cocktail sauce. Horseradish that you find on the shelf. Ketchup. Mustard. Meat flavorings and tenderizers. Bouillon cubes. Hot sauce and Tabasco sauce. Premade or packaged marinades. Premade or packaged taco seasonings. Relishes. Regular salad dressings. Where to find more information:  National Heart, Lung, and [place]: https://wilson-eaton.com/  American Heart Association: www.heart.org Summary  The DASH eating plan is a healthy eating plan that has been shown to reduce high blood pressure (hypertension). It may also reduce your risk for type 2 diabetes, heart disease, and stroke.  With the DASH eating plan,  you should limit salt (sodium) intake to 2,300 mg a day. If you have hypertension, you may need to reduce your sodium intake to 1,500 mg a day.  When on the DASH eating plan, aim to eat more fresh fruits and vegetables, whole grains, lean proteins, low-fat dairy, and heart-healthy fats.  Work with your health care provider or diet and nutrition specialist (dietitian) to adjust your eating plan to your individual calorie needs. This information is not intended to replace advice given to you by your health care provider.  Make sure you discuss any questions you have with your health care provider. Document Released: 03/07/2011 Document Revised: 03/11/2016 Document Reviewed: 03/11/2016 Elsevier Interactive Patient Education  2017 [employer].

## 2016-07-22 NOTE — Progress Notes (Signed)
Subjective:    Patient ID: Kenneth [name], male    DOB: 1955-09-03, 61 y.o.   MRN: [id number]  HPI  Kenneth [name] is a 61 year old male who presents today for follow up of hypertension. He was last evaluated in late March 2018 with noted hypertension on Atenolol 50 mg for which he recently restarted after being off of for the past 1 month. During his last visit his BP was 156/96 so HCTZ 25 mg was added to his regimen.  His BP in the office today is 144/86. He is compliant to his atenolol and HCTZ. [facility] since his last visit with readings of 140's/70-80's. He has noticed reduced edema with compression socks. He denies chest pain.   Review of Systems  Constitutional: Negative for fatigue.  Eyes: Negative for visual disturbance.  Respiratory: Negative for shortness of breath.   Cardiovascular: Negative for chest pain.       Reduction in lower extremity edema.  Neurological: Negative for dizziness and headaches.       Past Medical History:  Diagnosis Date  . Anxiety and depression   . Bipolar 1 disorder ([place])   . Hypercholesterolemia   . Hypertension   . Type 2 diabetes mellitus ([place])      Social History   Social History  . Marital status: Divorced    Spouse name: N/A  . Number of children: N/A  . Years of education: N/A   Occupational History  . Not on file.   Social History Main Topics  . Smoking status: Current Every Day Smoker    Packs/day: 1.00    Types: Cigarettes    Last attempt to quit: 04/05/2001  . Smokeless tobacco: Never Used  . Alcohol use No  . Drug use: No  . Sexual activity: Not on file   Other Topics Concern  . Not on file   Social History Narrative   Single.   1 daughter, 1 grandchild.   Retired. Once worked in [employer].   Enjoys sports, racing, traveling.        Past Surgical History:  Procedure Laterality Date  . L hand surgery    . R knee arthoroscopy    . SPLENECTOMY      Family History    Problem Relation Age of Onset  . Hypertension Mother   . Mental illness Mother   . Diabetes Mother   . Hypertension Father   . Lung cancer Father   . Diabetes Paternal Grandmother     No Known Allergies  Current Outpatient Prescriptions on File Prior to Visit  Medication Sig Dispense Refill  . acetaminophen (TYLENOL) 500 MG tablet Take 500 mg by mouth every 6 (six) hours as needed (headache).    . methocarbamol (ROBAXIN) 500 MG tablet Take 500-750 mg by mouth every 12 (twelve) hours.     . Omega-3 Fatty Acids (OMEGA 3 PO) Take 1 capsule by mouth daily.    . sertraline (ZOLOFT) 50 MG tablet Take 1 tablet (50 mg total) by mouth daily. 90 tablet 0   No current facility-administered medications on file prior to visit.     BP (!) 144/86   Pulse 63   Temp 98.1 F (36.7 C) (Oral)   Ht 5\' 9"  (1.753 m)   Wt 235 lb 12.8 oz (107 kg)   SpO2 96%   BMI 34.82 kg/m    Objective:   Physical Exam  Constitutional: He appears well-nourished.  Neck: Neck supple.  Cardiovascular: Normal rate, regular rhythm and normal heart sounds.   Pulmonary/Chest: Effort normal and breath sounds normal.  Skin: Skin is warm and dry.          Assessment & Plan:

## 2016-07-22 NOTE — Assessment & Plan Note (Signed)
Improved with addition of HCTZ, not yet at goal. Add in Lisinopril 10 mg given diabetes, continue Atenolol. Will have him to start monitoring BP daily with his home cuff and follow up in 2-3 weeks for BP check and BMP. Information provided regarding DASH diet.

## 2016-08-02 ENCOUNTER — Emergency Department (HOSPITAL_COMMUNITY)
Admission: EM | Admit: 2016-08-02 | Discharge: 2016-08-02 | Disposition: A | Discharge status: Home / Self Care | Payer: PPO | Attending: Emergency Medicine | Admitting: Emergency Medicine

## 2016-08-02 ENCOUNTER — Encounter (HOSPITAL_COMMUNITY): Payer: Self-pay

## 2016-08-02 ENCOUNTER — Telehealth: Payer: Self-pay | Admitting: Primary Care

## 2016-08-02 ENCOUNTER — Emergency Department (HOSPITAL_COMMUNITY): Payer: PPO

## 2016-08-02 DIAGNOSIS — F17210 Nicotine dependence, cigarettes, uncomplicated: Secondary | ICD-10-CM | POA: Diagnosis not present

## 2016-08-02 DIAGNOSIS — N200 Calculus of kidney: Secondary | ICD-10-CM | POA: Diagnosis not present

## 2016-08-02 DIAGNOSIS — R8291 Other chromoabnormalities of urine: Secondary | ICD-10-CM | POA: Diagnosis not present

## 2016-08-02 DIAGNOSIS — Z79899 Other long term (current) drug therapy: Secondary | ICD-10-CM | POA: Diagnosis not present

## 2016-08-02 DIAGNOSIS — R109 Unspecified abdominal pain: Secondary | ICD-10-CM

## 2016-08-02 DIAGNOSIS — R3129 Other microscopic hematuria: Secondary | ICD-10-CM | POA: Insufficient documentation

## 2016-08-02 DIAGNOSIS — I10 Essential (primary) hypertension: Secondary | ICD-10-CM | POA: Diagnosis not present

## 2016-08-02 DIAGNOSIS — E119 Type 2 diabetes mellitus without complications: Secondary | ICD-10-CM | POA: Diagnosis not present

## 2016-08-02 LAB — COMPREHENSIVE METABOLIC PANEL
ALT: 31 U/L (ref 17–63)
AST: 32 U/L (ref 15–41)
Albumin: 3.7 g/dL (ref 3.5–5.0)
Alkaline Phosphatase: 53 U/L (ref 38–126)
Anion gap: 9 (ref 5–15)
BUN: 15 mg/dL (ref 6–20)
CO2: 26 mmol/L (ref 22–32)
Calcium: 9 mg/dL (ref 8.9–10.3)
Chloride: 105 mmol/L (ref 101–111)
Creatinine, Ser: 0.92 mg/dL (ref 0.61–1.24)
GFR calc Af Amer: >60 mL/min (ref >60)
GFR calc non Af Amer: >60 mL/min (ref >60)
Glucose, Bld: 129 mg/dL — ABNORMAL HIGH (ref 65–99)
Potassium: 3.2 mmol/L — ABNORMAL LOW (ref 3.5–5.1)
Sodium: 140 mmol/L (ref 135–145)
Total Bilirubin: 0.7 mg/dL (ref 0.3–1.2)
Total Protein: 6.9 g/dL (ref 6.5–8.1)

## 2016-08-02 LAB — URINALYSIS, ROUTINE W REFLEX MICROSCOPIC
Bacteria, UA: NONE SEEN
Bilirubin Urine: NEGATIVE
Glucose, UA: NEGATIVE mg/dL
Ketones, ur: 5 mg/dL — AB
Leukocytes, UA: NEGATIVE
Nitrite: NEGATIVE
Protein, ur: 100 mg/dL — AB
Specific Gravity, Urine: 1.025 (ref 1.005–1.030)
pH: 5 (ref 5.0–8.0)

## 2016-08-02 LAB — CK: Total CK: 281 U/L (ref 49–397)

## 2016-08-02 MED ORDER — TRAMADOL HCL 50 MG PO TABS: 50.0000 mg | ORAL_TABLET | Freq: Four times a day (QID) | ORAL | 0 refills | Status: DC | PRN

## 2016-08-02 NOTE — ED Triage Notes (Signed)
Pt reports mowing and doing strenuous yard work yesterday. Last night when he was urinating, he stated that that it was "[employer] colored." Pt denies burning with urination. He also states that he is experiencing lower abdominal and lower back soreness and pain. A&Ox4. Ambulatory.

## 2016-08-02 NOTE — Telephone Encounter (Signed)
Unable to reach pt by phone but per chart review tab pt has gone to [facility] ED.

## 2016-08-02 NOTE — ED Notes (Signed)
RN obtaining labs

## 2016-08-02 NOTE — Telephone Encounter (Signed)
[facility] Patient Name: Kenneth [name] DOB: 09/26/1955 Initial Comment Urine was like [employer] color last night and has some abdominal pain. Nurse Assessment Guidelines Guideline Title Affirmed Question Affirmed Notes Final Disposition User FINAL ATTEMPT MADE - no message left Zayas, RN, Lenna Sciara

## 2016-08-02 NOTE — ED Provider Notes (Signed)
[place] DEPT Provider Note   CSN: [id number] Arrival date & time: 08/02/16  1253     History   Chief Complaint Chief Complaint  Patient presents with  . Abdominal Pain  . Back Pain  . Dysuria    HPI Kenneth [name] is a 61 y.o. male.  Chief complaint is back pain, and dark urine.  HPI:  This is a 46-year-old male. Last week he noted sharp. Afterwards he had some dark urine. It lasted about one day and went away. No obvious blood. States it was dark like T. He was sore afterwards. He took it easy for a few days. Had loose Epifania Gore again yesterday. He was 85. He states he drank a half a liter of Pepsi, but he was outside about 4 hours. Afterwards his urine was dark. This morning it was just yellow. He has some discomfort in his thighs and back. He does not have localized flank pain. No dysuria or frequency.  Is not take a statin for his cholesterol.  Past Medical History:  Diagnosis Date  . Anxiety and depression   . Bipolar 1 disorder ([place])   . Hypercholesterolemia   . Hypertension   . Type 2 diabetes mellitus [facility])     Patient Active Problem List   Diagnosis Date Noted  . Type 2 diabetes mellitus without complication, without long-term current use of insulin ([place]) 06/26/2016  . Anxiety and depression 06/26/2016  . Essential hypertension 06/26/2016  . Hyperlipidemia 06/26/2016  . Chronic neck pain 06/26/2016    Past Surgical History:  Procedure Laterality Date  . L hand surgery    . R knee arthoroscopy    . SPLENECTOMY         Home Medications    Prior to Admission medications   Medication Sig Start Date End Date Taking? Authorizing Provider  acetaminophen (TYLENOL) 500 MG tablet Take 500 mg by mouth every 6 (six) hours as needed (headache).   Yes Historical Provider, MD  Ascorbic Acid (VITAMIN C) 1000 MG tablet Take 1,000 mg by mouth daily.   Yes Historical Provider, MD  atenolol (TENORMIN) 50 MG tablet Take 1 tablet (50 mg total) by mouth daily.  07/22/16  Yes Pleas Koch, NP  diclofenac (VOLTAREN) 75 MG EC tablet Take 75 mg by mouth daily.    Yes Historical Provider, MD  folic acid (FOLVITE) 1 MG tablet Take 1 mg by mouth daily.   Yes Historical Provider, MD  hydrochlorothiazide (HYDRODIURIL) 25 MG tablet Take 1 tablet (25 mg total) by mouth daily. 07/22/16  Yes Pleas Koch, NP  lisinopril (PRINIVIL,ZESTRIL) 10 MG tablet Take 1 tablet (10 mg total) by mouth daily. 07/22/16  Yes Pleas Koch, NP  methocarbamol (ROBAXIN) 500 MG tablet Take 750 mg by mouth every 12 (twelve) hours.  04/26/16  Yes Historical Provider, MD  Multiple Vitamins-Minerals (MULTIVITAMIN ADULT) TABS Take 1 tablet by mouth daily.   Yes Historical Provider, MD  Omega-3 Fatty Acids (OMEGA 3 PO) Take 1 capsule by mouth daily.   Yes Historical Provider, MD  sertraline (ZOLOFT) 50 MG tablet Take 1 tablet (50 mg total) by mouth daily. 06/26/16  Yes Pleas Koch, NP  VITAMIN E PO Take 1 tablet by mouth daily.   Yes Historical Provider, MD  traMADol (ULTRAM) 50 MG tablet Take 1 tablet (50 mg total) by mouth every 6 (six) hours as needed. 08/02/16   Tanna Furry, MD    Family History Family History  Problem Relation Age of  Onset  . Hypertension Mother   . Mental illness Mother   . Diabetes Mother   . Hypertension Father   . Lung cancer Father   . Diabetes Paternal Grandmother     Social History Social History  Substance Use Topics  . Smoking status: Current Every Day Smoker    Packs/day: 1.00    Types: Cigarettes    Last attempt to quit: 04/05/2001  . Smokeless tobacco: Never Used  . Alcohol use No     Allergies   Patient has no known allergies.   Review of Systems Review of Systems  Constitutional: Negative for appetite change, chills, diaphoresis, fatigue and fever.  HENT: Negative for mouth sores, sore throat and trouble swallowing.   Eyes: Negative for visual disturbance.  Respiratory: Negative for cough, chest tightness, shortness of  breath and wheezing.   Cardiovascular: Negative for chest pain.  Gastrointestinal: Negative for abdominal distention, abdominal pain, diarrhea, nausea and vomiting.  Endocrine: Negative for polydipsia, polyphagia and polyuria.  Genitourinary: Positive for hematuria. Negative for dysuria and frequency.       Dark urine.  Musculoskeletal: Positive for myalgias. Negative for gait problem.  Skin: Negative for color change, pallor and rash.  Neurological: Negative for dizziness, syncope, light-headedness and headaches.  Hematological: Does not bruise/bleed easily.  Psychiatric/Behavioral: Negative for behavioral problems and confusion.     Physical Exam Updated Vital Signs BP 130/73 (BP Location: Right Arm)   Pulse 66   Temp 97.7 F (36.5 C) (Oral)   Resp 14   SpO2 95%   Physical Exam  Constitutional: He is oriented to person, place, and time. He appears well-developed and well-nourished. No distress.  HENT:  Head: Normocephalic.  Eyes: Conjunctivae are normal. Pupils are equal, round, and reactive to light. No scleral icterus.  Neck: Normal range of motion. Neck supple. No thyromegaly present.  Cardiovascular: Normal rate and regular rhythm.  Exam reveals no gallop and no friction rub.   No murmur heard. Pulmonary/Chest: Effort normal and breath sounds normal. No respiratory distress. He has no wheezes. He has no rales.  Abdominal: Soft. Bowel sounds are normal. He exhibits no distension. There is no tenderness. There is no rebound.  Musculoskeletal: Normal range of motion.       Back:  No flank pain or tenderness.  Neurological: He is alert and oriented to person, place, and time.  Skin: Skin is warm and dry. No rash noted.  Psychiatric: He has a normal mood and affect. His behavior is normal.     ED Treatments / Results  Labs (all labs ordered are listed, but only abnormal results are displayed) Labs Reviewed  URINALYSIS, ROUTINE W REFLEX MICROSCOPIC - Abnormal; Notable  for the following:       Result Value   Color, Urine AMBER (*)    APPearance HAZY (*)    Hgb urine dipstick LARGE (*)    Ketones, ur 5 (*)    Protein, ur 100 (*)    Squamous Epithelial / LPF 0-5 (*)    All other components within normal limits  COMPREHENSIVE METABOLIC PANEL - Abnormal; Notable for the following:    Potassium 3.2 (*)    Glucose, Bld 129 (*)    All other components within normal limits  CK    EKG  EKG Interpretation None       Radiology Ct Renal Stone Study  Result Date: 08/02/2016 CLINICAL DATA:  Urine discoloration with dysuria, low abdominal and back pain for 1 day following yard  work. EXAM: CT ABDOMEN AND PELVIS WITHOUT CONTRAST TECHNIQUE: Multidetector CT imaging of the abdomen and pelvis was performed following the standard protocol without IV contrast. COMPARISON:  Abdominal ultrasound 03/12/2016. FINDINGS: Lower chest: Mild atelectasis or scarring posteriorly at the left lung base. No significant pleural or pericardial effusion. Hepatobiliary: The liver appears unremarkable as imaged in the noncontrast state. No evidence of gallstones, gallbladder wall thickening or biliary dilatation. Pancreas: Unremarkable. No pancreatic ductal dilatation or surrounding inflammatory changes. Spleen: There are subphrenic surgical clips consistent with previous splenectomy. A small amount of regenerated splenic tissue is likely, best seen on the reformatted images. This includes small components anterior to the splenic flexure of the colon. Adrenals/Urinary Tract: Both adrenal glands appear normal. There are calculi in both renal pelves, measuring 10 x 7 mm on the left (image 26) and 10 x 8 mm on the right (image 36). No other urinary tract calculi are seen. There is no hydronephrosis or secondary signs of ureteral obstruction. Tiny exophytic lesion in the interpolar region of the right kidney, too small to characterize. No significant bladder findings. Stomach/Bowel: No evidence of  bowel wall thickening, distention or surrounding inflammatory change. The appendix appears normal. Vascular/Lymphatic: There are no enlarged abdominal or pelvic lymph nodes. There is a 10 mm portacaval node on image 28 and a right common iliac node measuring 9 mm on image 55. There is mild aortic and branch vessel atherosclerosis. Reproductive: The prostate gland and seminal vesicles demonstrate no significant findings. Other: No evidence of abdominal wall mass or hernia. No ascites. Musculoskeletal: No acute osseous findings. There is lower lumbar spondylosis with potentially symptomatic spinal stenosis, greatest at L2-3 and L4-5. There is also some foraminal narrowing inferiorly. IMPRESSION: 1. Sizable calculi within the renal pelves bilaterally. These have the potential to intermittently obstruct the ureteropelvic junctions with the patient erect. No current evidence of urinary tract obstruction. 2. Postsurgical changes consistent with previous splenectomy. Small amount of regenerated splenic tissue. 3. Small lymph nodes, probably reactive. 4. Mild atherosclerosis. 5. Lumbar spondylosis, potentially symptomatic. Electronically Signed   By: Richardean Sale M.D.   On: 08/02/2016 17:47    Procedures Procedures (including critical care time)  Medications Ordered in ED Medications - No data to display   Initial Impression / Assessment and Plan / ED Course  I have reviewed the triage vital signs and the nursing notes.  Pertinent labs & imaging results that were available during my care of the patient were reviewed by me and considered in my medical decision making (see chart for details).     Patient is not jaundiced. Does not take a statin. Possibilities include rhabdomyolysis, urobilinogen, kidney stones. Kidney infection.  Final Clinical Impressions(s) / ED Diagnoses   Final diagnoses:  Kidney stones  Other microscopic hematuria    Hematuria. CT scan showed bilateral renal pelvic calculi.  No hydronephrosis. Comment per radiologist that these are in a position where they could potentially cause symptoms when upright. Essentially causing a ball valve affect. This likely explains his hematuria and intermittent pain. He was discharged instructed to follow-up with urology. ER with acute changes.  New Prescriptions New Prescriptions   TRAMADOL (ULTRAM) 50 MG TABLET    Take 1 tablet (50 mg total) by mouth every 6 (six) hours as needed.     Tanna Furry, MD 08/02/16 [phone]

## 2016-08-02 NOTE — Discharge Instructions (Signed)
Call Dr. Karsten Ro at [facility] urology on Monday to make an appointment to discuss treatment for your kidney stones.

## 2016-08-02 NOTE — Telephone Encounter (Signed)
Noted, patient at [facility] now.

## 2016-08-05 NOTE — Telephone Encounter (Addendum)
Noted. Please notify patient that we received the notes from the hospital and are sorry to hear about the kidney stones. He should follow up with the Urologist as recommended.

## 2016-08-05 NOTE — Telephone Encounter (Signed)
[place] Night - Client Nonclinical Telephone Record [facility] Night - Client Client Site [place] - Night Physician Alma Friendly - NP Contact Type Call Who Is Calling Patient / Member / Family / Caregiver Caller Name Page Phone Number [phone] Call Type Message Only Information Provided Reason for Call Returning a Call from the Office Initial Taylor states he is returning call to the office. Additional Comment Caller was wanting to let office know he has kidney stones and that was why he was having blood in his urine. Was in the ER and has been referred to a Urologist.

## 2016-08-05 NOTE — Telephone Encounter (Signed)
Message left for patient to return my call.  

## 2016-08-08 DIAGNOSIS — N200 Calculus of kidney: Secondary | ICD-10-CM | POA: Diagnosis not present

## 2016-08-09 NOTE — Telephone Encounter (Signed)
Noted  

## 2016-08-09 NOTE — Telephone Encounter (Signed)
PT returned your call. He went to urologist 5/10 to follow up and he is scheduled for laser procedure. Pt is waiting to hear from their office for scheduling. He is feeling better- just sore. He is sch to follow up with [facility] 5/14 and he confirmed he will be here.

## 2016-08-09 NOTE — Telephone Encounter (Signed)
Message left for patient to return my call.  

## 2016-08-12 ENCOUNTER — Ambulatory Visit (INDEPENDENT_AMBULATORY_CARE_PROVIDER_SITE_OTHER): Payer: PPO | Admitting: Primary Care

## 2016-08-12 ENCOUNTER — Encounter: Payer: Self-pay | Admitting: Primary Care

## 2016-08-12 VITALS — BP 140/86 | HR 89 | Temp 98.2°F | Ht 69.0 in | Wt 230.8 lb

## 2016-08-12 DIAGNOSIS — Z122 Encounter for screening for malignant neoplasm of respiratory organs: Secondary | ICD-10-CM

## 2016-08-12 DIAGNOSIS — E119 Type 2 diabetes mellitus without complications: Secondary | ICD-10-CM

## 2016-08-12 DIAGNOSIS — E785 Hyperlipidemia, unspecified: Secondary | ICD-10-CM | POA: Diagnosis not present

## 2016-08-12 DIAGNOSIS — I10 Essential (primary) hypertension: Secondary | ICD-10-CM

## 2016-08-12 DIAGNOSIS — R011 Cardiac murmur, unspecified: Secondary | ICD-10-CM

## 2016-08-12 DIAGNOSIS — R600 Localized edema: Secondary | ICD-10-CM

## 2016-08-12 MED ORDER — LISINOPRIL 20 MG PO TABS: 20.0000 mg | ORAL_TABLET | Freq: Every day | ORAL | 0 refills | Status: DC

## 2016-08-12 NOTE — Patient Instructions (Addendum)
We've increased your lisinopril from 10 mg to 20 mg. Start lisinopril 20 mg tablets once daily.  Continue atenolol 50 mg and hydrochlorothiazide 25 mg once daily.  Monitor your blood pressure for the next two weeks, we will call you for readings at that time.  You will be contacted regarding your echocardiogram. Please let us know if you have not heard back within one week.   It's important to improve your diet by reducing consumption of fast food, fried food, processed snack foods, sugary drinks. Increase consumption of fresh vegetables and fruits, whole grains, water.  Ensure you are drinking 64 ounces of water daily.  Schedule a lab only appointment in 3 months to recheck your cholesterol. Make sure you don't eat or drink anything besides water prior to your lab draw.  It was a pleasure to see you today!

## 2016-08-12 NOTE — Assessment & Plan Note (Signed)
Check A1C in 3 months.

## 2016-08-12 NOTE — Progress Notes (Signed)
Subjective:    Patient ID: Kenneth [name], male    DOB: 02/25/1956, 61 y.o.   MRN: [id number]  HPI  Kenneth [name] is a 61 year old male who presents today for follow up.  1) Essential Hypertension: Currently managed on hydrochlorothiazide, lisinopril 10 mg, and atenolol 50 mg. His BP in the office is 140/86. He's checking his BP in machines at grocery stores and is getting readings of 140's/80's. He denies chest pain, dizziness, shortness of breath. He has noticed lower extremity edema for the past several weeks. He's using compression socks with improvement.  He does not elevate his extremities at bedtime. He was once managed on Lasix.  2) Hyperlipidemia: Lipid panel in late March 2018 with borderline TC, LDL of 129, and triglycerides of 167. Since his last visit he's worked on improvement in diet by making healthier choices and is reducing portion sizes.  Wt Readings from Last 3 Encounters:  08/12/16 230 lb 12.8 oz (104.7 kg)  07/22/16 235 lb 12.8 oz (107 kg)  06/26/16 241 lb 6.4 oz (109.5 kg)  .  Diet currently consists of:  Breakfast: Fast food Lunch: Fast food, beans, salad Dinner: Skips Snacks: Sunflower seeds, potato chips, onions, dry cereal Desserts: Chocolate, snack cakes, cookies, 4 times weekly Beverages: Some water, regular sodas, sweet tea  Exercise: He does not currently exercise.   3) Emergency Department Follow Up: Evaluated on 08/02/16 at [facility] for back pain and dark urine. He was noted to have numerous renal stones to the bilateral renal pelves. It was recommended he follow up with a Urologist. He was discharged home with a prescription for Tramadol to use. He has since seen his Urologist who determined he had two stones remaining. He will undergo a laser procedure in the near future.   Overall he's noticed improvement but is still tender from the stones. His urine has fluctuated from a light/dark yellow. No obvious bleeding.    Review of Systems  Constitutional:  Negative for fever.  Respiratory: Negative for shortness of breath.   Cardiovascular: Positive for leg swelling. Negative for chest pain.  Gastrointestinal: Negative for abdominal pain.  Genitourinary: Positive for flank pain. Negative for dysuria and frequency.       Past Medical History:  Diagnosis Date  . Anxiety and depression   . Bipolar 1 disorder ([place])   . Hypercholesterolemia   . Hypertension   . Type 2 diabetes mellitus ([place])      Social History   Social History  . Marital status: Divorced    Spouse name: N/A  . Number of children: N/A  . Years of education: N/A   Occupational History  . Not on file.   Social History Main Topics  . Smoking status: Current Every Day Smoker    Packs/day: 1.00    Types: Cigarettes    Last attempt to quit: 04/05/2001  . Smokeless tobacco: Never Used  . Alcohol use No  . Drug use: No  . Sexual activity: Not on file   Other Topics Concern  . Not on file   Social History Narrative   Single.   1 daughter, 1 grandchild.   Retired. Once worked in [employer].   Enjoys sports, racing, traveling.        Past Surgical History:  Procedure Laterality Date  . L hand surgery    . R knee arthoroscopy    . SPLENECTOMY      Family History  Problem Relation Age of Onset  . Hypertension Mother   .  Mental illness Mother   . Diabetes Mother   . Hypertension Father   . Lung cancer Father   . Diabetes Paternal Grandmother     No Known Allergies  Current Outpatient Prescriptions on File Prior to Visit  Medication Sig Dispense Refill  . Ascorbic Acid (VITAMIN C) 1000 MG tablet Take 1,000 mg by mouth daily.    Marland Kitchen atenolol (TENORMIN) 50 MG tablet Take 1 tablet (50 mg total) by mouth daily. 90 tablet 1  . diclofenac (VOLTAREN) 75 MG EC tablet Take 75 mg by mouth daily.     . folic acid (FOLVITE) 1 MG tablet Take 1 mg by mouth daily.    . hydrochlorothiazide (HYDRODIURIL) 25 MG tablet Take 1 tablet (25 mg total) by mouth daily. 90 tablet 1    . methocarbamol (ROBAXIN) 500 MG tablet Take 750 mg by mouth every 12 (twelve) hours.     . Multiple Vitamins-Minerals (MULTIVITAMIN ADULT) TABS Take 1 tablet by mouth daily.    . Omega-3 Fatty Acids (OMEGA 3 PO) Take 1 capsule by mouth daily.    . sertraline (ZOLOFT) 50 MG tablet Take 1 tablet (50 mg total) by mouth daily. 90 tablet 0  . traMADol (ULTRAM) 50 MG tablet Take 1 tablet (50 mg total) by mouth every 6 (six) hours as needed. 15 tablet 0  . VITAMIN E PO Take 1 tablet by mouth daily.    Marland Kitchen acetaminophen (TYLENOL) 500 MG tablet Take 500 mg by mouth every 6 (six) hours as needed (headache).     No current facility-administered medications on file prior to visit.     BP 140/86   Pulse 89   Temp 98.2 F (36.8 C) (Oral)   Ht 5\' 9"  (1.753 m)   Wt 230 lb 12.8 oz (104.7 kg)   SpO2 95%   BMI 34.08 kg/m  ' Objective:   Physical Exam  Constitutional: He appears well-nourished.  Neck: Neck supple.  Cardiovascular: Normal rate and regular rhythm.   Murmur heard. Lower extremity edema noted bilaterally, trace pitting. Aortic murmur noted.  Pulmonary/Chest: Effort normal and breath sounds normal.  Skin: Skin is warm and dry.          Assessment & Plan:  Emergency Department Follow Up:  Presented to [facility] for flank pain and dark colored urine. Found to have two large renal stones in each renal pelvis. Has followed with Urology who is planning on laser procedure soon. Encouraged water consumption, Tramadol PRN (he is using sparingly). Exam today stable.  All hospital labs, notes, and imaging reviewed. Kenneth Flow, NP

## 2016-08-12 NOTE — Assessment & Plan Note (Signed)
Noted bilaterally, history of this in prior years. Aortic murmur noted on exam today. Will order echocardiogram to rule out any evidence of heart failure or valve disorder. Echo pending.

## 2016-08-12 NOTE — Assessment & Plan Note (Signed)
Continues to be above goal. Increase lisinopril to 20 mg. Continue atenolol 50 mg and HCTZ 25 mg. BMP pending today. Will call him two weeks for readings.

## 2016-08-12 NOTE — Assessment & Plan Note (Signed)
Check lipids in 3 months

## 2016-08-13 ENCOUNTER — Telehealth: Payer: Self-pay | Admitting: *Deleted

## 2016-08-13 LAB — BASIC METABOLIC PANEL
BUN: 13 mg/dL (ref 6–23)
CO2: 29 mEq/L (ref 19–32)
Calcium: 9.3 mg/dL (ref 8.4–10.5)
Chloride: 107 mEq/L (ref 96–112)
Creatinine, Ser: 0.94 mg/dL (ref 0.40–1.50)
GFR: 86.82 mL/min (ref >60.00)
Glucose, Bld: 102 mg/dL — ABNORMAL HIGH (ref 70–99)
Potassium: 3.7 mEq/L (ref 3.5–5.1)
Sodium: 141 mEq/L (ref 135–145)

## 2016-08-14 NOTE — Telephone Encounter (Signed)
error 

## 2016-08-15 ENCOUNTER — Telehealth: Payer: Self-pay | Admitting: *Deleted

## 2016-08-15 NOTE — Telephone Encounter (Signed)
Received referral for low dose lung cancer screening CT scan. Message left at phone number listed in EMR for patient to call me back to facilitate scheduling scan. In message notified patient that screening is available in [place] as well.

## 2016-08-16 ENCOUNTER — Telehealth: Payer: Self-pay | Admitting: *Deleted

## 2016-08-16 NOTE — Telephone Encounter (Signed)
Patient returned call regarding lung screening scan. Obtained smoking history which included a lengthy period of time where he did not smoke. Due to this his maximum calculated pack year history is 21. Discussed importance of smoking cessation and consideration in future of lung screening if he is unable to stop smoking.

## 2016-08-18 NOTE — Telephone Encounter (Signed)
Noted, thank you

## 2016-08-23 DIAGNOSIS — N200 Calculus of kidney: Secondary | ICD-10-CM | POA: Diagnosis not present

## 2016-08-27 ENCOUNTER — Telehealth: Payer: Self-pay | Admitting: Primary Care

## 2016-08-27 NOTE — Telephone Encounter (Signed)
Message left for patient to return my call.  

## 2016-08-27 NOTE — Telephone Encounter (Signed)
-----   Message from Pleas Koch, NP sent at 08/12/2016  5:34 PM EDT ----- Regarding: BP Please check on patient's BP since we increased his Lisinopril to 20 mg.

## 2016-08-28 ENCOUNTER — Ambulatory Visit (HOSPITAL_COMMUNITY): Payer: PPO | Attending: Cardiovascular Disease

## 2016-08-28 ENCOUNTER — Other Ambulatory Visit: Payer: Self-pay

## 2016-08-28 DIAGNOSIS — R600 Localized edema: Secondary | ICD-10-CM | POA: Insufficient documentation

## 2016-08-28 DIAGNOSIS — Z6834 Body mass index (BMI) 34.0-34.9, adult: Secondary | ICD-10-CM | POA: Insufficient documentation

## 2016-08-28 DIAGNOSIS — E669 Obesity, unspecified: Secondary | ICD-10-CM | POA: Insufficient documentation

## 2016-08-28 DIAGNOSIS — E785 Hyperlipidemia, unspecified: Secondary | ICD-10-CM | POA: Insufficient documentation

## 2016-08-28 DIAGNOSIS — E119 Type 2 diabetes mellitus without complications: Secondary | ICD-10-CM | POA: Insufficient documentation

## 2016-08-28 DIAGNOSIS — R011 Cardiac murmur, unspecified: Secondary | ICD-10-CM | POA: Diagnosis not present

## 2016-08-28 DIAGNOSIS — I119 Hypertensive heart disease without heart failure: Secondary | ICD-10-CM | POA: Diagnosis not present

## 2016-08-28 NOTE — Telephone Encounter (Signed)
Message left for patient to return my call.  

## 2016-08-29 ENCOUNTER — Encounter: Payer: Self-pay | Admitting: *Deleted

## 2016-08-29 DIAGNOSIS — N200 Calculus of kidney: Secondary | ICD-10-CM | POA: Diagnosis not present

## 2016-08-29 NOTE — Telephone Encounter (Signed)
Patient returned your call.

## 2016-08-29 NOTE — Telephone Encounter (Signed)
Spoken to patient. He stated that his blood pressure has been running around 135s-140s over 66s-70s

## 2016-08-29 NOTE — Telephone Encounter (Signed)
Patient returned Chan's call.  Please call patient back at [phone].

## 2016-08-29 NOTE — Telephone Encounter (Signed)
Message left for patient to return my call.  

## 2016-08-30 NOTE — Telephone Encounter (Signed)
Noted, will continue current BP regimen for now. Will have him work on diet and exercise as discussed last visit.

## 2016-09-10 ENCOUNTER — Other Ambulatory Visit (INDEPENDENT_AMBULATORY_CARE_PROVIDER_SITE_OTHER): Payer: PPO

## 2016-09-10 ENCOUNTER — Ambulatory Visit (INDEPENDENT_AMBULATORY_CARE_PROVIDER_SITE_OTHER): Payer: PPO | Admitting: Internal Medicine

## 2016-09-10 ENCOUNTER — Encounter: Payer: Self-pay | Admitting: Internal Medicine

## 2016-09-10 ENCOUNTER — Ambulatory Visit (INDEPENDENT_AMBULATORY_CARE_PROVIDER_SITE_OTHER)
Admission: RE | Admit: 2016-09-10 | Discharge: 2016-09-10 | Disposition: A | Discharge status: Home / Self Care | Payer: PPO | Source: Ambulatory Visit | Attending: Internal Medicine | Admitting: Internal Medicine

## 2016-09-10 ENCOUNTER — Telehealth: Payer: Self-pay | Admitting: Primary Care

## 2016-09-10 VITALS — BP 138/80 | HR 79 | Temp 97.9°F | Resp 16 | Ht 69.0 in | Wt 228.5 lb

## 2016-09-10 DIAGNOSIS — K219 Gastro-esophageal reflux disease without esophagitis: Secondary | ICD-10-CM | POA: Diagnosis not present

## 2016-09-10 DIAGNOSIS — I10 Essential (primary) hypertension: Secondary | ICD-10-CM

## 2016-09-10 DIAGNOSIS — R9431 Abnormal electrocardiogram [ECG] [EKG]: Secondary | ICD-10-CM | POA: Diagnosis not present

## 2016-09-10 DIAGNOSIS — R10816 Epigastric abdominal tenderness: Secondary | ICD-10-CM

## 2016-09-10 DIAGNOSIS — R110 Nausea: Secondary | ICD-10-CM | POA: Insufficient documentation

## 2016-09-10 LAB — COMPREHENSIVE METABOLIC PANEL
ALT: 25 U/L (ref 0–53)
AST: 18 U/L (ref 0–37)
Albumin: 4 g/dL (ref 3.5–5.2)
Alkaline Phosphatase: 71 U/L (ref 39–117)
BUN: 12 mg/dL (ref 6–23)
CO2: 30 mEq/L (ref 19–32)
Calcium: 9.2 mg/dL (ref 8.4–10.5)
Chloride: 107 mEq/L (ref 96–112)
Creatinine, Ser: 0.84 mg/dL (ref 0.40–1.50)
GFR: 98.82 mL/min (ref >60.00)
Glucose, Bld: 102 mg/dL — ABNORMAL HIGH (ref 70–99)
Potassium: 3.8 mEq/L (ref 3.5–5.1)
Sodium: 141 mEq/L (ref 135–145)
Total Bilirubin: 0.4 mg/dL (ref 0.2–1.2)
Total Protein: 7 g/dL (ref 6.0–8.3)

## 2016-09-10 LAB — URINALYSIS, ROUTINE W REFLEX MICROSCOPIC
Bilirubin Urine: NEGATIVE
Hgb urine dipstick: NEGATIVE
Ketones, ur: NEGATIVE
Leukocytes, UA: NEGATIVE
Nitrite: NEGATIVE
RBC / HPF: NONE SEEN (ref 0–?)
Specific Gravity, Urine: 1.02 (ref 1.000–1.030)
Urine Glucose: NEGATIVE
Urobilinogen, UA: 0.2 (ref 0.0–1.0)
pH: 6 (ref 5.0–8.0)

## 2016-09-10 LAB — CBC WITH DIFFERENTIAL/PLATELET
Basophils Absolute: 0.2 10*3/uL — ABNORMAL HIGH (ref 0.0–0.1)
Basophils Relative: 1.3 % (ref 0.0–3.0)
Eosinophils Absolute: 0.5 10*3/uL (ref 0.0–0.7)
Eosinophils Relative: 4.2 % (ref 0.0–5.0)
HCT: 45.9 % (ref 39.0–52.0)
Hemoglobin: 15.5 g/dL (ref 13.0–17.0)
Lymphocytes Relative: 28.9 % (ref 12.0–46.0)
Lymphs Abs: 3.6 10*3/uL (ref 0.7–4.0)
MCHC: 33.9 g/dL (ref 30.0–36.0)
MCV: 98 fl (ref 78.0–100.0)
Monocytes Absolute: 1.3 10*3/uL — ABNORMAL HIGH (ref 0.1–1.0)
Monocytes Relative: 10.1 % (ref 3.0–12.0)
Neutro Abs: 6.9 10*3/uL (ref 1.4–7.7)
Neutrophils Relative %: 55.5 % (ref 43.0–77.0)
Platelets: 456 10*3/uL — ABNORMAL HIGH (ref 150.0–400.0)
RBC: 4.69 Mil/uL (ref 4.22–5.81)
RDW: 12.9 % (ref 11.5–15.5)
WBC: 12.5 10*3/uL — ABNORMAL HIGH (ref 4.0–10.5)

## 2016-09-10 LAB — AMYLASE: Amylase: 17 U/L — ABNORMAL LOW (ref 27–131)

## 2016-09-10 LAB — LIPASE: Lipase: 34 U/L (ref 11.0–59.0)

## 2016-09-10 MED ORDER — ONDANSETRON HCL 4 MG PO TABS: 4.0000 mg | ORAL_TABLET | Freq: Three times a day (TID) | ORAL | 0 refills | Status: DC | PRN

## 2016-09-10 MED ORDER — DEXLANSOPRAZOLE 60 MG PO CPDR: 60.0000 mg | DELAYED_RELEASE_CAPSULE | Freq: Every day | ORAL | 1 refills | Status: DC

## 2016-09-10 NOTE — Patient Instructions (Signed)

## 2016-09-10 NOTE — Telephone Encounter (Signed)
Pt had appt 09/10/16 at 3:15 with Dr Scarlette Calico.

## 2016-09-10 NOTE — Telephone Encounter (Signed)
Patient Name: MRK BUZBY DOB: Apr 13, 1955 Initial Comment Caller states that he has been nauseated for the last 4 days. He states that he has been outside and is not sure if something bit him that is making him sick. He states that he nausea gets worse when he is laying down. Alma Friendly, NP Nurse Assessment Nurse: Rock Nephew, RN, Juliann Pulse Date/Time (Eastern Time): 09/10/2016 11:30:16 AM Confirm and document reason for call. If symptomatic, describe symptoms. ---Caller states that he has been nauseated for the last 4 days. He states that the nausea gets worse when he is laying down. He is feeling more tired than usual also . Does the patient have any new or worsening symptoms? ---Yes Will a triage be completed? ---Yes Related visit to physician within the last 2 weeks? ---No Does the PT have any chronic conditions? (i.e. diabetes, asthma, etc.) ---Yes List chronic conditions. ---HTN Is this a behavioral health or substance abuse call? ---No Guidelines Guideline Title Affirmed Question Affirmed Notes Weakness (Generalized) and Fatigue [1] MODERATE weakness (i.e., interferes with work, school, normal activities) AND [2] cause unknown (Exceptions: weakness with acute minor illness, or weakness from poor fluid intake) Final Disposition User See Physician within 4 Hours (or PCP triage) Rock Nephew, RN, Juliann Pulse Comments Appt made today at the [place] office at 315 pm with Dr Ronnald Ramp per Freida Busman RN . I provided address and phone number to patient . Referrals REFERRED TO PCP OFFICE REFERRED TO PCP OFFICE Disagree/Comply: Comply

## 2016-09-10 NOTE — Assessment & Plan Note (Signed)
He has an abnormal but unchanged EKG I don't think his symptoms today sound like ischemia However, he does have risk factors for CAD so I have asked him to understand undergo a [place]

## 2016-09-10 NOTE — Progress Notes (Signed)
Subjective:  Patient ID: Kenneth [name], male    DOB: 01-14-1956  Age: 61 y.o. MRN: [id number]  CC: Abdominal Pain  NEW TO ME  HPI Kenneth [name] presents for a 4 day history of nausea without vomiting, diffuse upper abdominal discomfort, heartburn, and mild loss of appetite. He has kept down [facility] and coffee today. He denies odynophagia or dysphagia. He denies diarrhea, constipation, melena, or blood in his stools. He takes Tylenol for pain but does not take anti-inflammatories. He does smoke cigarettes and tells me he does not drink alcohol. He has tried Tums for the heartburn without much relief from the symptoms.  Outpatient Medications Prior to Visit  Medication Sig Dispense Refill  . acetaminophen (TYLENOL) 500 MG tablet Take 500 mg by mouth every 6 (six) hours as needed (headache).    . Ascorbic Acid (VITAMIN C) 1000 MG tablet Take 1,000 mg by mouth daily.    Marland Kitchen atenolol (TENORMIN) 50 MG tablet Take 1 tablet (50 mg total) by mouth daily. 90 tablet 1  . folic acid (FOLVITE) 1 MG tablet Take 1 mg by mouth daily.    . hydrochlorothiazide (HYDRODIURIL) 25 MG tablet Take 1 tablet (25 mg total) by mouth daily. 90 tablet 1  . lisinopril (PRINIVIL,ZESTRIL) 20 MG tablet Take 1 tablet (20 mg total) by mouth daily. 90 tablet 0  . methocarbamol (ROBAXIN) 500 MG tablet Take 750 mg by mouth every 12 (twelve) hours.     . Multiple Vitamins-Minerals (MULTIVITAMIN ADULT) TABS Take 1 tablet by mouth daily.    . Omega-3 Fatty Acids (OMEGA 3 PO) Take 1 capsule by mouth daily.    . sertraline (ZOLOFT) 50 MG tablet Take 1 tablet (50 mg total) by mouth daily. 90 tablet 0  . traMADol (ULTRAM) 50 MG tablet Take 1 tablet (50 mg total) by mouth every 6 (six) hours as needed. 15 tablet 0  . VITAMIN E PO Take 1 tablet by mouth daily.    . diclofenac (VOLTAREN) 75 MG EC tablet Take 75 mg by mouth daily.      No facility-administered medications prior to visit.     ROS Review of Systems  Constitutional:  Positive for appetite change. Negative for activity change, chills, diaphoresis, fatigue and unexpected weight change.  HENT: Negative.  Negative for trouble swallowing and voice change.   Eyes: Negative for visual disturbance.  Respiratory: Negative for cough, chest tightness and wheezing.   Cardiovascular: Negative.  Negative for chest pain, palpitations and leg swelling.  Gastrointestinal: Positive for abdominal pain and nausea. Negative for abdominal distention, anal bleeding, blood in stool, constipation, diarrhea, rectal pain and vomiting.  Endocrine: Negative.   Genitourinary: Negative.  Negative for decreased urine volume, difficulty urinating, dysuria, flank pain, hematuria and urgency.  Musculoskeletal: Negative.  Negative for back pain and neck pain.  Skin: Negative.  Negative for pallor and rash.  Neurological: Negative.  Negative for dizziness, syncope and weakness.  Hematological: Negative.  Negative for adenopathy. Does not bruise/bleed easily.  Psychiatric/Behavioral: Negative.     Objective:  BP 138/80 (BP Location: Left Arm, Patient Position: Sitting, Cuff Size: Normal)   Pulse 79   Temp 97.9 F (36.6 C) (Oral)   Resp 16   Ht 5\' 9"  (1.753 m)   Wt 228 lb 8 oz (103.6 kg)   SpO2 98%   BMI 33.74 kg/m   BP Readings from Last 3 Encounters:  09/10/16 138/80  08/12/16 140/86  08/02/16 133/69    Wt Readings from Last  3 Encounters:  09/10/16 228 lb 8 oz (103.6 kg)  08/12/16 230 lb 12.8 oz (104.7 kg)  07/22/16 235 lb 12.8 oz (107 kg)    Physical Exam  Constitutional: He is oriented to person, place, and time. No distress.  HENT:  Mouth/Throat: Oropharynx is clear and moist. No oropharyngeal exudate.  Eyes: Conjunctivae are normal. Right eye exhibits no discharge. Left eye exhibits no discharge. No scleral icterus.  Neck: Normal range of motion. Neck supple. No JVD present. No thyromegaly present.  Cardiovascular: Normal rate, regular rhythm, normal heart sounds and  intact distal pulses.  Exam reveals no gallop.   No murmur heard. EKG --  Sinus  Rhythm  -Old anteroseptal infarct.   ABNORMAL - no change from prior EKG done 6 months ago  Pulmonary/Chest: Effort normal and breath sounds normal. No respiratory distress. He has no wheezes. He has no rales. He exhibits no tenderness.  Abdominal: Soft. Normal appearance and bowel sounds are normal. He exhibits no shifting dullness, no distension, no ascites and no mass. There is no hepatosplenomegaly, splenomegaly or hepatomegaly. There is tenderness in the epigastric area. There is no rigidity, no rebound, no guarding, no CVA tenderness, no tenderness at McBurney's point and negative Murphy's sign. No hernia. Hernia confirmed negative in the ventral area, confirmed negative in the right inguinal area and confirmed negative in the left inguinal area.  There is mild diffuse epigastric tenderness  Genitourinary: Rectum normal, prostate normal, testes normal and penis normal. Rectal exam shows no external hemorrhoid, no internal hemorrhoid, no fissure, no mass, no tenderness, anal tone normal and guaiac negative stool. Prostate is not enlarged and not tender. Right testis shows no mass, no swelling and no tenderness. Right testis is descended. Left testis shows no mass, no swelling and no tenderness. Left testis is descended. No penile erythema or penile tenderness. No discharge found.  Musculoskeletal: Normal range of motion. He exhibits no edema or tenderness.  Lymphadenopathy:    He has no cervical adenopathy.       Right: No inguinal adenopathy present.       Left: No inguinal adenopathy present.  Neurological: He is alert and oriented to person, place, and time.  Skin: Skin is warm and dry. No rash noted. He is not diaphoretic. No erythema. No pallor.  Vitals reviewed.   Lab Results  Component Value Date   WBC 12.5 (H) 09/10/2016   HGB 15.5 09/10/2016   HCT 45.9 09/10/2016   PLT 456.0 (H) 09/10/2016    GLUCOSE 102 (H) 09/10/2016   CHOL 195 06/27/2016   TRIG 167 (H) 06/27/2016   HDL 33 (L) 06/27/2016   LDLCALC 129 (H) 06/27/2016   ALT 25 09/10/2016   AST 18 09/10/2016   NA 141 09/10/2016   K 3.8 09/10/2016   CL 107 09/10/2016   CREATININE 0.84 09/10/2016   BUN 12 09/10/2016   CO2 30 09/10/2016   HGBA1C 5.4 06/27/2016   MICROALBUR 10.4 06/27/2016    Ct Renal Stone Study  Result Date: 08/02/2016 CLINICAL DATA:  Urine discoloration with dysuria, low abdominal and back pain for 1 day following yard work. EXAM: CT ABDOMEN AND PELVIS WITHOUT CONTRAST TECHNIQUE: Multidetector CT imaging of the abdomen and pelvis was performed following the standard protocol without IV contrast. COMPARISON:  Abdominal ultrasound 03/12/2016. FINDINGS: Lower chest: Mild atelectasis or scarring posteriorly at the left lung base. No significant pleural or pericardial effusion. Hepatobiliary: The liver appears unremarkable as imaged in the noncontrast state. No evidence of  gallstones, gallbladder wall thickening or biliary dilatation. Pancreas: Unremarkable. No pancreatic ductal dilatation or surrounding inflammatory changes. Spleen: There are subphrenic surgical clips consistent with previous splenectomy. A small amount of regenerated splenic tissue is likely, best seen on the reformatted images. This includes small components anterior to the splenic flexure of the colon. Adrenals/Urinary Tract: Both adrenal glands appear normal. There are calculi in both renal pelves, measuring 10 x 7 mm on the left (image 26) and 10 x 8 mm on the right (image 36). No other urinary tract calculi are seen. There is no hydronephrosis or secondary signs of ureteral obstruction. Tiny exophytic lesion in the interpolar region of the right kidney, too small to characterize. No significant bladder findings. Stomach/Bowel: No evidence of bowel wall thickening, distention or surrounding inflammatory change. The appendix appears normal.  Vascular/Lymphatic: There are no enlarged abdominal or pelvic lymph nodes. There is a 10 mm portacaval node on image 28 and a right common iliac node measuring 9 mm on image 55. There is mild aortic and branch vessel atherosclerosis. Reproductive: The prostate gland and seminal vesicles demonstrate no significant findings. Other: No evidence of abdominal wall mass or hernia. No ascites. Musculoskeletal: No acute osseous findings. There is lower lumbar spondylosis with potentially symptomatic spinal stenosis, greatest at L2-3 and L4-5. There is also some foraminal narrowing inferiorly. IMPRESSION: 1. Sizable calculi within the renal pelves bilaterally. These have the potential to intermittently obstruct the ureteropelvic junctions with the patient erect. No current evidence of urinary tract obstruction. 2. Postsurgical changes consistent with previous splenectomy. Small amount of regenerated splenic tissue. 3. Small lymph nodes, probably reactive. 4. Mild atherosclerosis. 5. Lumbar spondylosis, potentially symptomatic. Electronically Signed   By: Richardean Sale M.D.   On: 08/02/2016 17:47    Assessment & Plan:   Temitayo was seen today for abdominal pain.  Diagnoses and all orders for this visit:  Nausea- his labs are negative for any intra-abdominal pathology such as hepatitis, pancreatitis, blood loss, or hematuria. His plain films are normal as well. EKG shows old changes in the anterior septal region- he's had no chest pain or shortness of breath so I don't think the nausea is related to angina, will treat the nausea with Zofran and will treat for GERD as well. -     Lipase; Future -     Comprehensive metabolic panel; Future -     CBC with Differential/Platelet; Future -     Amylase; Future -     EKG 12-Lead -     ondansetron (ZOFRAN) 4 MG tablet; Take 1 tablet (4 mg total) by mouth every 8 (eight) hours as needed for nausea or vomiting.  Epigastric abdominal tenderness without rebound tenderness-  as above, I've asked him to undergo an ultrasound to see if he has cholelithiasis. -     DG Abd Acute W/Chest; Future -     Lipase; Future -     Comprehensive metabolic panel; Future -     CBC with Differential/Platelet; Future -     Amylase; Future -     Urinalysis, Routine w reflex microscopic; Future -     US Abdomen Complete; Future  GERD without esophagitis- will start treating this with a PPI. -     CBC with Differential/Platelet; Future -     dexlansoprazole (DEXILANT) 60 MG capsule; Take 1 capsule (60 mg total) by mouth daily. -     ondansetron (ZOFRAN) 4 MG tablet; Take 1 tablet (4 mg total) by mouth every  8 (eight) hours as needed for nausea or vomiting.  Hypertension, unspecified type- her blood pressures adequately well-controlled, EKG is negative for LVH. -     EKG 12-Lead   I have discontinued Mr. Aldaz's diclofenac. I am also having him start on dexlansoprazole and ondansetron. Additionally, I am having him maintain his Omega-3 Fatty Acids (OMEGA 3 PO), acetaminophen, methocarbamol, sertraline, hydrochlorothiazide, atenolol, folic acid, vitamin C, MULTIVITAMIN ADULT, VITAMIN E PO, traMADol, and lisinopril.  Meds ordered this encounter  Medications  . dexlansoprazole (DEXILANT) 60 MG capsule    Sig: Take 1 capsule (60 mg total) by mouth daily.    Dispense:  90 capsule    Refill:  1  . ondansetron (ZOFRAN) 4 MG tablet    Sig: Take 1 tablet (4 mg total) by mouth every 8 (eight) hours as needed for nausea or vomiting.    Dispense:  30 tablet    Refill:  0     Follow-up: Return in about 1 day (around 09/11/2016).  Scarlette Calico, MD

## 2016-09-11 ENCOUNTER — Other Ambulatory Visit: Payer: Self-pay | Admitting: Internal Medicine

## 2016-09-11 ENCOUNTER — Telehealth: Payer: Self-pay

## 2016-09-11 ENCOUNTER — Telehealth (HOSPITAL_COMMUNITY): Payer: Self-pay | Admitting: Family Medicine

## 2016-09-11 DIAGNOSIS — K219 Gastro-esophageal reflux disease without esophagitis: Secondary | ICD-10-CM

## 2016-09-11 DIAGNOSIS — N200 Calculus of kidney: Secondary | ICD-10-CM | POA: Diagnosis not present

## 2016-09-11 MED ORDER — PANTOPRAZOLE SODIUM 40 MG PO TBEC: 40.0000 mg | DELAYED_RELEASE_TABLET | Freq: Every day | ORAL | 1 refills | Status: DC

## 2016-09-11 NOTE — Telephone Encounter (Signed)
Pharmacy sent fax: request to change the dexilant.  Please advise if you want to change it or start PA.  PA needed due to step therapy.

## 2016-09-12 ENCOUNTER — Telehealth (HOSPITAL_COMMUNITY): Payer: Self-pay | Admitting: *Deleted

## 2016-09-12 NOTE — Telephone Encounter (Signed)
Patient given detailed instructions per Myocardial Perfusion Study Information Sheet for the test on 09/17/16. Patient notified to arrive 15 minutes early and that it is imperative to arrive on time for appointment to keep from having the test rescheduled.  If you need to cancel or reschedule your appointment, please call the office within 24 hours of your appointment. . Patient verbalized understanding. [name], Kenneth [name]     

## 2016-09-12 NOTE — Telephone Encounter (Signed)
09/11/2016 10:47 AM Phone (Outgoing) Kenneth [name], Kenneth [name] (Self) [phone] (H)   Left Message - Called pt and lmsg for him to CB to get him scheduled for myoview.     By Verdene Rio

## 2016-09-13 ENCOUNTER — Ambulatory Visit: Payer: PPO | Admitting: Primary Care

## 2016-09-17 ENCOUNTER — Ambulatory Visit (HOSPITAL_COMMUNITY): Payer: PPO | Attending: Cardiovascular Disease

## 2016-09-17 DIAGNOSIS — R110 Nausea: Secondary | ICD-10-CM | POA: Diagnosis not present

## 2016-09-17 DIAGNOSIS — R9431 Abnormal electrocardiogram [ECG] [EKG]: Secondary | ICD-10-CM | POA: Diagnosis not present

## 2016-09-17 DIAGNOSIS — I10 Essential (primary) hypertension: Secondary | ICD-10-CM | POA: Insufficient documentation

## 2016-09-17 LAB — MYOCARDIAL PERFUSION IMAGING
LV dias vol: 140 mL (ref 62–150)
LV sys vol: 68 mL
Peak HR: 71 {beats}/min
RATE: 0.26
Rest HR: 59 {beats}/min
SDS: 1
SRS: 1
SSS: 2
TID: 1.1

## 2016-09-17 MED ORDER — TECHNETIUM TC 99M TETROFOSMIN IV KIT
10.3000 | PACK | Freq: Once | INTRAVENOUS | Status: AC | PRN
  Administered 2016-09-17: 10.3 via INTRAVENOUS
  Filled 2016-09-17: qty 11

## 2016-09-17 MED ORDER — TECHNETIUM TC 99M TETROFOSMIN IV KIT
32.7000 | PACK | Freq: Once | INTRAVENOUS | Status: AC | PRN
  Administered 2016-09-17: 32.7 via INTRAVENOUS
  Filled 2016-09-17: qty 33

## 2016-09-17 MED ORDER — REGADENOSON 0.4 MG/5ML IV SOLN
0.4000 mg | Freq: Once | INTRAVENOUS | Status: AC
  Administered 2016-09-17: 0.4 mg via INTRAVENOUS

## 2016-09-18 ENCOUNTER — Encounter: Payer: Self-pay | Admitting: *Deleted

## 2016-10-08 ENCOUNTER — Telehealth: Payer: Self-pay | Admitting: Internal Medicine

## 2016-10-08 ENCOUNTER — Ambulatory Visit
Admission: RE | Admit: 2016-10-08 | Discharge: 2016-10-08 | Disposition: A | Discharge status: Home / Self Care | Payer: PPO | Source: Ambulatory Visit | Attending: Internal Medicine | Admitting: Internal Medicine

## 2016-10-08 ENCOUNTER — Telehealth: Payer: Self-pay | Admitting: Primary Care

## 2016-10-08 DIAGNOSIS — R10816 Epigastric abdominal tenderness: Secondary | ICD-10-CM

## 2016-10-08 DIAGNOSIS — R1013 Epigastric pain: Secondary | ICD-10-CM | POA: Diagnosis not present

## 2016-10-08 NOTE — Telephone Encounter (Signed)
Pt would like a call back with results from today

## 2016-10-17 ENCOUNTER — Ambulatory Visit: Payer: PPO | Admitting: Primary Care

## 2016-10-21 ENCOUNTER — Ambulatory Visit: Payer: PPO | Admitting: Primary Care

## 2016-10-22 ENCOUNTER — Encounter: Payer: Self-pay | Admitting: Primary Care

## 2016-10-22 ENCOUNTER — Ambulatory Visit (INDEPENDENT_AMBULATORY_CARE_PROVIDER_SITE_OTHER): Payer: PPO | Admitting: Primary Care

## 2016-10-22 VITALS — BP 140/80 | HR 61 | Temp 98.4°F | Ht 69.0 in | Wt 236.4 lb

## 2016-10-22 DIAGNOSIS — R0683 Snoring: Secondary | ICD-10-CM | POA: Diagnosis not present

## 2016-10-22 NOTE — Patient Instructions (Signed)
Stop by the front desk and speak with either Rosaria Ferries or Shirlean Mylar regarding your referral to pulmonology for the sleep study.  Continue to work on weight loss through diet and exercise. This will help to improve snoring.  It was a pleasure to see you today!

## 2016-10-22 NOTE — Progress Notes (Signed)
Subjective:    Patient ID: Kenneth [name], male    DOB: Aug 04, 1955, 61 y.o.   MRN: [id number]  HPI  Kenneth [name] is a 61 year old male with a history of hypertension, type 2 diabetes, obesity who presents today with a chief complaint of snoring. He endorses a history of snoring for years. His girlfriend has also noticed loud snoring. He was snoring in a movie theater and was told by someone in the theater that he was snoring loudly. He's been sleeping on a couch for which he started doing during Spring 2018. Over the past several months he's noticed falling asleep with soda cans in his hand. He easily dozes off during church, in movies, and can fall asleep most anywhere. He sleeps well during the night.  Epworth Sleepiness Scale Score of 16 today. He's never had a sleep study.   Review of Systems  Constitutional: Negative for fatigue.  HENT: Negative for congestion.   Respiratory: Negative for shortness of breath.   Cardiovascular: Negative for chest pain.  Psychiatric/Behavioral: Negative for sleep disturbance.       Past Medical History:  Diagnosis Date  . Anxiety and depression   . Bipolar 1 disorder ([place])   . Hypercholesterolemia   . Hypertension   . Type 2 diabetes mellitus ([place])      Social History   Social History  . Marital status: Divorced    Spouse name: N/A  . Number of children: N/A  . Years of education: N/A   Occupational History  . Not on file.   Social History Main Topics  . Smoking status: Current Every Day Smoker    Packs/day: 1.00    Types: Cigarettes    Last attempt to quit: 04/05/2001  . Smokeless tobacco: Never Used  . Alcohol use No  . Drug use: No  . Sexual activity: Not on file   Other Topics Concern  . Not on file   Social History Narrative   Single.   1 daughter, 1 grandchild.   Retired. Once worked in [employer].   Enjoys sports, racing, traveling.        Past Surgical History:  Procedure Laterality Date  . L hand surgery    . R knee  arthoroscopy    . SPLENECTOMY      Family History  Problem Relation Age of Onset  . Hypertension Mother   . Mental illness Mother   . Diabetes Mother   . Hypertension Father   . Lung cancer Father   . Diabetes Paternal Grandmother     No Known Allergies  Current Outpatient Prescriptions on File Prior to Visit  Medication Sig Dispense Refill  . acetaminophen (TYLENOL) 500 MG tablet Take 500 mg by mouth every 6 (six) hours as needed (headache).    . Ascorbic Acid (VITAMIN C) 1000 MG tablet Take 1,000 mg by mouth daily.    Marland Kitchen atenolol (TENORMIN) 50 MG tablet Take 1 tablet (50 mg total) by mouth daily. 90 tablet 1  . folic acid (FOLVITE) 1 MG tablet Take 1 mg by mouth daily.    . hydrochlorothiazide (HYDRODIURIL) 25 MG tablet Take 1 tablet (25 mg total) by mouth daily. 90 tablet 1  . lisinopril (PRINIVIL,ZESTRIL) 20 MG tablet Take 1 tablet (20 mg total) by mouth daily. 90 tablet 0  . methocarbamol (ROBAXIN) 500 MG tablet Take 750 mg by mouth every 12 (twelve) hours.     . Multiple Vitamins-Minerals (MULTIVITAMIN ADULT) TABS Take 1 tablet by mouth daily.    Marland Kitchen  Omega-3 Fatty Acids (OMEGA 3 PO) Take 1 capsule by mouth daily.    . pantoprazole (PROTONIX) 40 MG tablet Take 1 tablet (40 mg total) by mouth daily. 90 tablet 1  . sertraline (ZOLOFT) 50 MG tablet Take 1 tablet (50 mg total) by mouth daily. 90 tablet 0  . traMADol (ULTRAM) 50 MG tablet Take 1 tablet (50 mg total) by mouth every 6 (six) hours as needed. 15 tablet 0  . VITAMIN E PO Take 1 tablet by mouth daily.     No current facility-administered medications on file prior to visit.     BP 140/80   Pulse 61   Temp 98.4 F (36.9 C) (Oral)   Ht 5\' 9"  (1.753 m)   Wt 236 lb 6.4 oz (107.2 kg)   SpO2 95%   BMI 34.91 kg/m    Objective:   Physical Exam  Constitutional: He appears well-nourished.  Neck: Neck supple.  Cardiovascular: Normal rate and regular rhythm.   Pulmonary/Chest: Effort normal and breath sounds normal.    Skin: Skin is warm and dry.          Assessment & Plan:

## 2016-10-22 NOTE — Assessment & Plan Note (Signed)
Epworth Sleepiness Scale of 16 today. Symptoms sound very similar to sleep apnea, especially given his dozing in public. Referral placed to pulmonology for sleep study evaluation. Discussed importance of weight loss and BP control.

## 2016-12-16 ENCOUNTER — Other Ambulatory Visit: Payer: Self-pay | Admitting: Primary Care

## 2016-12-16 ENCOUNTER — Other Ambulatory Visit: Payer: Self-pay | Admitting: Internal Medicine

## 2016-12-16 DIAGNOSIS — R110 Nausea: Secondary | ICD-10-CM

## 2016-12-16 DIAGNOSIS — F329 Major depressive disorder, single episode, unspecified: Secondary | ICD-10-CM

## 2016-12-16 DIAGNOSIS — F419 Anxiety disorder, unspecified: Principal | ICD-10-CM

## 2016-12-16 DIAGNOSIS — K219 Gastro-esophageal reflux disease without esophagitis: Secondary | ICD-10-CM

## 2016-12-16 DIAGNOSIS — F32A Depression, unspecified: Secondary | ICD-10-CM

## 2016-12-17 ENCOUNTER — Other Ambulatory Visit: Payer: Self-pay | Admitting: Internal Medicine

## 2016-12-17 DIAGNOSIS — R110 Nausea: Secondary | ICD-10-CM

## 2016-12-17 DIAGNOSIS — K219 Gastro-esophageal reflux disease without esophagitis: Secondary | ICD-10-CM

## 2017-01-02 ENCOUNTER — Other Ambulatory Visit: Payer: Self-pay | Admitting: Orthopedic Surgery

## 2017-01-02 DIAGNOSIS — M542 Cervicalgia: Secondary | ICD-10-CM

## 2017-01-10 ENCOUNTER — Ambulatory Visit (INDEPENDENT_AMBULATORY_CARE_PROVIDER_SITE_OTHER): Payer: PPO | Admitting: Pulmonary Disease

## 2017-01-10 ENCOUNTER — Encounter: Payer: Self-pay | Admitting: Pulmonary Disease

## 2017-01-10 VITALS — BP 158/90 | HR 62 | Ht 68.0 in | Wt 234.0 lb

## 2017-01-10 DIAGNOSIS — R29818 Other symptoms and signs involving the nervous system: Secondary | ICD-10-CM | POA: Diagnosis not present

## 2017-01-10 NOTE — Patient Instructions (Signed)
Will arrange for home sleep study Will call to arrange for follow up after sleep study reviewed  

## 2017-01-10 NOTE — Progress Notes (Signed)
Past Surgical History He  has a past surgical history that includes L hand surgery; Splenectomy; and R knee arthoroscopy.  No Known Allergies  Family History His family history includes Diabetes in his mother and paternal grandmother; Hypertension in his father and mother; Lung cancer in his father; Mental illness in his mother.  Social History He  reports that he has been smoking Cigarettes.  He has a 20.50 pack-year smoking history. He has never used smokeless tobacco. He reports that he does not drink alcohol or use drugs.  Review of systems Constitutional: Negative for fever and unexpected weight change.  HENT: Positive for congestion and dental problem. Negative for ear pain, nosebleeds, postnasal drip, rhinorrhea, sinus pressure, sneezing, sore throat and trouble swallowing.   Eyes: Negative for redness and itching.  Respiratory: Positive for shortness of breath. Negative for cough, chest tightness and wheezing.   Cardiovascular: Negative for palpitations and leg swelling.  Gastrointestinal: Negative for nausea and vomiting.  Genitourinary: Negative for dysuria.  Musculoskeletal: Negative for joint swelling.  Skin: Negative for rash.  Allergic/Immunologic: Positive for environmental allergies. Negative for food allergies and immunocompromised state.  Neurological: Positive for headaches.  Hematological: Does not bruise/bleed easily.  Psychiatric/Behavioral: Negative for dysphoric mood. The patient is nervous/anxious.     Current Outpatient Prescriptions on File Prior to Visit  Medication Sig  . acetaminophen (TYLENOL) 500 MG tablet Take 500 mg by mouth every 6 (six) hours as needed (headache).  . Ascorbic Acid (VITAMIN C) 1000 MG tablet Take 1,000 mg by mouth daily.  Marland Kitchen atenolol (TENORMIN) 50 MG tablet Take 1 tablet (50 mg total) by mouth daily.  Marland Kitchen lisinopril (PRINIVIL,ZESTRIL) 20 MG tablet Take 1 tablet (20 mg total) by mouth daily.  . Multiple Vitamins-Minerals (MULTIVITAMIN  ADULT) TABS Take 1 tablet by mouth daily.  . Omega-3 Fatty Acids (OMEGA 3 PO) Take 1 capsule by mouth daily.  . sertraline (ZOLOFT) 50 MG tablet TAKE ONE TABLET BY MOUTH DAILY  . VITAMIN E PO Take 1 tablet by mouth daily.   No current facility-administered medications on file prior to visit.     Chief Complaint  Patient presents with  . Sleep Consutl    pt referred by Dr. Carlis Abbott Md; pt snoring and daytime sleepyness. Pt has difficulties driving at night time due to the darkness and lights. Pt sleeps around 6 hrs each night.     Cardiac tests Mod LVH, EF 55 to 60%  Past medical history He  has a past medical history of Anxiety and depression; Bipolar 1 disorder ([place]); Hypercholesterolemia; Hypertension; and Type 2 diabetes mellitus ([place]).  Vital signs BP (!) 158/90 (BP Location: Left Arm, Cuff Size: Normal)   Pulse 62   Ht 5\' 8"  (1.727 m)   Wt 234 lb (106.1 kg)   SpO2 94%   BMI 35.58 kg/m   History of Present Illness Kenneth [name] is a 61 y.o. male for evaluation of sleep problems.  He has noticed trouble feeling very sleepy during the day.  This has been present for years.  He falls asleep whenever he sits down.  He will then start snoring.  He has fallen asleep while driving before.  He has been told he stops breathing at night.  He can't sleep on his back.  He is a restless sleeper and can't get a comfortable position to sleep.  He will talk in his sleep also.  He has tried changing his pillow and mattress, but these changes haven't helped.  He goes  to sleep at 1130 pm.  He falls asleep in 30 minutes.  He wakes up some times to use the bathroom.  He gets out of bed at 630 am.  He feels tired in the morning.  He gets morning headache.  He does not use anything to help him fall sleep.  He drinks 6 cups of coffee during the day.  He denies sleep walking, bruxism, or nightmares.  There is no history of restless legs.  He denies sleep hallucinations, sleep paralysis, or  cataplexy.  The Epworth score is 19 out of 24.   Physical Exam:  General - No distress Eyes - wears glasses ENT - No sinus tenderness, no oral exudate, no LAN, no thyromegaly, TM clear, pupils equal/reactive, MP 4, elongated uvula, enlarged tongue Cardiac - s1s2 regular, no murmur, pulses symmetric Chest - No wheeze/rales/dullness, good air entry, normal respiratory excursion Back - No focal tenderness Abd - Soft, non-tender, no organomegaly, + bowel sounds Ext - No edema, amputation of Lt index finger Neuro - Normal strength, cranial nerves intact Skin - No rashes Psych - Normal mood, and behavior  Discussion: He has snoring, sleep disruption, witnessed apnea, and daytime sleepiness.  He has history of hypertension and depression.  I am concerned he could have sleep apnea.  We discussed how sleep apnea can affect various health problems, including risks for hypertension, cardiovascular disease, and diabetes.  We also discussed how sleep disruption can increase risks for accidents, such as while driving.  Weight loss as a means of improving sleep apnea was also reviewed.  Additional treatment options discussed were CPAP therapy, oral appliance, and surgical intervention.   Assessment/plan:  Suspected sleep apnea. - will arrange for home sleep study  Tobacco abuse. - discussed importance of smoking cessation   Patient Instructions  Will arrange for home sleep study Will call to arrange for follow up after sleep study reviewed     Chesley Mires, M.D. Pager [phone] 01/10/2017, 12:42 PM

## 2017-01-10 NOTE — Progress Notes (Signed)
   Subjective:    Patient ID: Kenneth [name], male    DOB: 28-Nov-1955, 61 y.o.   MRN: [id number]  HPI    Review of Systems  Constitutional: Negative for fever and unexpected weight change.  HENT: Positive for congestion and dental problem. Negative for ear pain, nosebleeds, postnasal drip, rhinorrhea, sinus pressure, sneezing, sore throat and trouble swallowing.   Eyes: Negative for redness and itching.  Respiratory: Positive for shortness of breath. Negative for cough, chest tightness and wheezing.   Cardiovascular: Negative for palpitations and leg swelling.  Gastrointestinal: Negative for nausea and vomiting.  Genitourinary: Negative for dysuria.  Musculoskeletal: Negative for joint swelling.  Skin: Negative for rash.  Allergic/Immunologic: Positive for environmental allergies. Negative for food allergies and immunocompromised state.  Neurological: Positive for headaches.  Hematological: Does not bruise/bleed easily.  Psychiatric/Behavioral: Negative for dysphoric mood. The patient is nervous/anxious.        Objective:   Physical Exam        Assessment & Plan:

## 2017-01-15 ENCOUNTER — Ambulatory Visit
Admission: RE | Admit: 2017-01-15 | Discharge: 2017-01-15 | Disposition: A | Discharge status: Home / Self Care | Payer: PPO | Source: Ambulatory Visit | Attending: Orthopedic Surgery | Admitting: Orthopedic Surgery

## 2017-01-15 DIAGNOSIS — M542 Cervicalgia: Secondary | ICD-10-CM | POA: Diagnosis not present

## 2017-01-15 MED ORDER — TRIAMCINOLONE ACETONIDE 40 MG/ML IJ SUSP (RADIOLOGY)
60.0000 mg | Freq: Once | INTRAMUSCULAR | Status: AC
  Administered 2017-01-15: 60 mg via EPIDURAL

## 2017-01-15 MED ORDER — IOPAMIDOL (ISOVUE-M 300) INJECTION 61%
1.0000 mL | Freq: Once | INTRAMUSCULAR | Status: AC | PRN
  Administered 2017-01-15: 1 mL via EPIDURAL

## 2017-01-15 NOTE — Discharge Instructions (Signed)

## 2017-01-22 ENCOUNTER — Other Ambulatory Visit: Payer: Self-pay | Admitting: Primary Care

## 2017-01-22 DIAGNOSIS — I10 Essential (primary) hypertension: Secondary | ICD-10-CM

## 2017-02-03 ENCOUNTER — Other Ambulatory Visit: Payer: Self-pay | Admitting: Internal Medicine

## 2017-02-03 DIAGNOSIS — K219 Gastro-esophageal reflux disease without esophagitis: Secondary | ICD-10-CM

## 2017-02-03 DIAGNOSIS — R110 Nausea: Secondary | ICD-10-CM

## 2017-02-04 ENCOUNTER — Telehealth: Payer: Self-pay | Admitting: Primary Care

## 2017-02-04 DIAGNOSIS — F32A Depression, unspecified: Secondary | ICD-10-CM

## 2017-02-04 DIAGNOSIS — F329 Major depressive disorder, single episode, unspecified: Secondary | ICD-10-CM

## 2017-02-04 DIAGNOSIS — I10 Essential (primary) hypertension: Secondary | ICD-10-CM

## 2017-02-04 DIAGNOSIS — F419 Anxiety disorder, unspecified: Secondary | ICD-10-CM

## 2017-02-04 NOTE — Telephone Encounter (Signed)
Spoke with pt.   He wants [facility] to be his pharmacy.    Pt is calling to see which [facility] will be sending him his medications.   There are 2 choices.   He will call back.

## 2017-02-04 NOTE — Telephone Encounter (Signed)
Copied from [place] [id number]. Topic: Quick Communication - See Telephone Encounter >> Feb 04, 2017 12:19 PM Hewitt Shorts wrote: CRM for notification. See Telephone encounter for:  02/04/17.

## 2017-02-04 NOTE — Telephone Encounter (Signed)
Called pt.   He wants to change his pharmacy to [facility].

## 2017-02-05 NOTE — Telephone Encounter (Signed)
I left a voicemail requesting pt give [place] a call regarding which [facility] would be sending his mediations.

## 2017-02-06 DIAGNOSIS — G4733 Obstructive sleep apnea (adult) (pediatric): Secondary | ICD-10-CM | POA: Diagnosis not present

## 2017-02-06 MED ORDER — HYDROCHLOROTHIAZIDE 25 MG PO TABS: 25.0000 mg | ORAL_TABLET | Freq: Every day | ORAL | 1 refills | Status: DC

## 2017-02-06 MED ORDER — LISINOPRIL 20 MG PO TABS: 20.0000 mg | ORAL_TABLET | Freq: Every day | ORAL | 1 refills | Status: DC

## 2017-02-06 MED ORDER — ATENOLOL 50 MG PO TABS: 50.0000 mg | ORAL_TABLET | Freq: Every day | ORAL | 1 refills | Status: DC

## 2017-02-06 MED ORDER — SERTRALINE HCL 50 MG PO TABS: 50.0000 mg | ORAL_TABLET | Freq: Every day | ORAL | 0 refills | Status: DC

## 2017-02-06 NOTE — Telephone Encounter (Signed)
Spoken to patient and confirm the medications.  hydrochlorothiazide (HYDRODIURIL) 25 MG tablet atenolol (TENORMIN) 50 MG tablet lisinopril (PRINIVIL,ZESTRIL) 20 MG tablet sertraline (ZOLOFT) 50 MG tablet  This medications are current from the last follow up on 08/12/2016 in Kate's assessment as well.  Will send these medications to [facility]

## 2017-02-07 ENCOUNTER — Telehealth: Payer: Self-pay | Admitting: Pulmonary Disease

## 2017-02-07 ENCOUNTER — Other Ambulatory Visit: Payer: Self-pay | Admitting: *Deleted

## 2017-02-07 DIAGNOSIS — G4733 Obstructive sleep apnea (adult) (pediatric): Secondary | ICD-10-CM | POA: Diagnosis not present

## 2017-02-07 DIAGNOSIS — R29818 Other symptoms and signs involving the nervous system: Secondary | ICD-10-CM

## 2017-02-07 NOTE — Telephone Encounter (Signed)
HST 02/07/17 >> AHI 20, SaO2 low 75%   Will have my nurse inform pt that sleep study shows moderate sleep apnea.  Options are 1) CPAP now, 2) ROV first.  If He is agreeable to CPAP, then please send order for auto CPAP range 5 to 15 cm H2O with heated humidity and mask of choice.  Have download sent 1 month after starting CPAP and set up ROV 2 months after starting CPAP.  ROV can be with me or NP.

## 2017-02-13 NOTE — Telephone Encounter (Signed)
Called and spoke with patient today regarding results per vs.  Informed the patient of his results today and recommendations per vs. The patient verbalized understanding and denied any questions or concerns at this time. Patient requests an ROV with vs to go over the sleep study results. Setting pt up for ROV for Wed Nov 28 at 11:15am.

## 2017-02-24 DIAGNOSIS — M542 Cervicalgia: Secondary | ICD-10-CM | POA: Diagnosis not present

## 2017-02-26 ENCOUNTER — Ambulatory Visit: Payer: PPO | Admitting: Pulmonary Disease

## 2017-03-10 ENCOUNTER — Ambulatory Visit: Payer: PPO | Admitting: Pulmonary Disease

## 2017-03-12 ENCOUNTER — Ambulatory Visit: Payer: PPO | Admitting: Pulmonary Disease

## 2017-03-17 ENCOUNTER — Ambulatory Visit: Payer: Self-pay | Admitting: *Deleted

## 2017-03-17 ENCOUNTER — Encounter (HOSPITAL_COMMUNITY): Payer: Self-pay | Admitting: Emergency Medicine

## 2017-03-17 ENCOUNTER — Ambulatory Visit (INDEPENDENT_AMBULATORY_CARE_PROVIDER_SITE_OTHER): Payer: PPO

## 2017-03-17 ENCOUNTER — Ambulatory Visit (HOSPITAL_COMMUNITY)
Admission: EM | Admit: 2017-03-17 | Discharge: 2017-03-17 | Disposition: A | Discharge status: Home / Self Care | Payer: PPO | Attending: Physician Assistant | Admitting: Physician Assistant

## 2017-03-17 DIAGNOSIS — T148XXA Other injury of unspecified body region, initial encounter: Secondary | ICD-10-CM

## 2017-03-17 DIAGNOSIS — S6991XA Unspecified injury of right wrist, hand and finger(s), initial encounter: Secondary | ICD-10-CM | POA: Diagnosis not present

## 2017-03-17 MED ORDER — SULFAMETHOXAZOLE-TRIMETHOPRIM 800-160 MG PO TABS: 1.0000 | ORAL_TABLET | Freq: Two times a day (BID) | ORAL | 0 refills | Status: AC

## 2017-03-17 NOTE — ED Provider Notes (Signed)
[facility]    CSN: [id number] Arrival date & time: 03/17/17  1931     History   Chief Complaint Chief Complaint  Patient presents with  . Finger Injury    HPI Kenneth [name] is a 61 y.o. male.   The history is provided by the patient. No language interpreter was used.  Hand Pain  This is a new problem. The current episode started 2 days ago. The problem occurs constantly. The problem has been gradually worsening. Nothing aggravates the symptoms. Nothing relieves the symptoms. He has tried nothing for the symptoms. The treatment provided no relief.  Pt reports a wire stuck through his finger 2 days ago.  Pt reports wire went through finger.  Pt's tetanus is up to date. Pt complains of soreness and some swelling  Past Medical History:  Diagnosis Date  . Anxiety and depression   . Bipolar 1 disorder ([place])   . Hypercholesterolemia   . Hypertension   . Type 2 diabetes mellitus [facility])     Patient Active Problem List   Diagnosis Date Noted  . OSA (obstructive sleep apnea) 02/07/2017  . Snoring 10/22/2016  . Nausea 09/10/2016  . Epigastric abdominal tenderness without rebound tenderness 09/10/2016  . GERD without esophagitis 09/10/2016  . Nonspecific abnormal electrocardiogram (ECG) (EKG) 09/10/2016  . Lower extremity edema 08/12/2016  . Type 2 diabetes mellitus without complication, without long-term current use of insulin ([place]) 06/26/2016  . Anxiety and depression 06/26/2016  . Hypertension 06/26/2016  . Hyperlipidemia 06/26/2016  . Chronic neck pain 06/26/2016    Past Surgical History:  Procedure Laterality Date  . L hand surgery    . R knee arthoroscopy    . SPLENECTOMY         Home Medications    Prior to Admission medications   Medication Sig Start Date End Date Taking? Authorizing Provider  acetaminophen (TYLENOL) 500 MG tablet Take 500 mg by mouth every 6 (six) hours as needed (headache).    [provider]  Ascorbic Acid (VITAMIN C)  1000 MG tablet Take 1,000 mg by mouth daily.    [provider]  atenolol (TENORMIN) 50 MG tablet Take 1 tablet (50 mg total) daily by mouth. 02/06/17   Pleas Koch, NP  hydrochlorothiazide (HYDRODIURIL) 25 MG tablet Take 1 tablet (25 mg total) daily by mouth. 02/06/17   Pleas Koch, NP  HYDROcodone-acetaminophen (NORCO/VICODIN) 5-325 MG tablet  12/30/16   [provider]  lisinopril (PRINIVIL,ZESTRIL) 20 MG tablet Take 1 tablet (20 mg total) daily by mouth. 02/06/17   Pleas Koch, NP  Multiple Vitamins-Minerals (MULTIVITAMIN ADULT) TABS Take 1 tablet by mouth daily.    [provider]  Omega-3 Fatty Acids (OMEGA 3 PO) Take 1 capsule by mouth daily.    [provider]  sertraline (ZOLOFT) 50 MG tablet Take 1 tablet (50 mg total) daily by mouth. 02/06/17   Pleas Koch, NP  sulfamethoxazole-trimethoprim (BACTRIM DS,SEPTRA DS) 800-160 MG tablet Take 1 tablet by mouth 2 (two) times daily for 7 days. 03/17/17 03/24/17  Fransico Meadow, PA-C  VITAMIN E PO Take 1 tablet by mouth daily.    [provider]    Family History Family History  Problem Relation Age of Onset  . Hypertension Mother   . Mental illness Mother   . Diabetes Mother   . Hypertension Father   . Lung cancer Father   . Diabetes Paternal Grandmother     Social History Social History  Tobacco Use  . Smoking status: Current Every Day Smoker    Packs/day: 0.50    Years: 41.00    Pack years: 20.50    Types: Cigarettes  . Smokeless tobacco: Never Used  Substance Use Topics  . Alcohol use: No  . Drug use: No     Allergies   Patient has no known allergies.   Review of Systems Review of Systems  All other systems reviewed and are negative.    Physical Exam Triage Vital Signs ED Triage Vitals [03/17/17 1941]  Enc Vitals Group     BP (!) 175/99     Pulse Rate 84     Resp 18     Temp 98.1 F (36.7 C)     Temp Source Oral     SpO2 99 %      Weight      Height      Head Circumference      Peak Flow      Pain Score      Pain Loc      Pain Edu?      Excl. in [place]?    No data found.  Updated Vital Signs BP (!) 175/99 (BP Location: Right Arm)   Pulse 84   Temp 98.1 F (36.7 C) (Oral)   Resp 18   SpO2 99%   Visual Acuity Right Eye Distance:   Left Eye Distance:   Bilateral Distance:    Right Eye Near:   Left Eye Near:    Bilateral Near:     Physical Exam  Constitutional: He appears well-developed and well-nourished.  Skin:  Puncture wound medial and lateral right index finger, slight redness,  nv and ns intact  Psychiatric: He has a normal mood and affect.  Nursing note and vitals reviewed.    UC Treatments / Results  Labs (all labs ordered are listed, but only abnormal results are displayed) Labs Reviewed - No data to display  EKG  EKG Interpretation None       Radiology No results found.  Procedures Procedures (including critical care time)  Medications Ordered in UC Medications - No data to display   Initial Impression / Assessment and Plan / UC Course  I have reviewed the triage vital signs and the nursing notes.  Pertinent labs & imaging results that were available during my care of the patient were reviewed by me and considered in my medical decision making (see chart for details).     Possible infection vs traumatic swelling. No foreign body on xray.   I will cover with bactrim,  Pt addvised to soak finger 20 minutes 4 times a day  Final Clinical Impressions(s) / UC Diagnoses   Final diagnoses:  Puncture wound    ED Discharge Orders        Ordered    sulfamethoxazole-trimethoprim (BACTRIM DS,SEPTRA DS) 800-160 MG tablet  2 times daily     03/17/17 2045       Controlled Substance Prescriptions  Controlled Substance Registry consulted? Not Applicable  An After Visit Summary was printed and given to the patient.    Fransico Meadow, [place] 03/17/17 2051

## 2017-03-17 NOTE — ED Triage Notes (Signed)
Pt sts right index finger that got wire stuck in 2 days; pt sts increased pain and swelling; pt sts UTD on TD

## 2017-03-17 NOTE — Discharge Instructions (Signed)
Soak area 20 minutes 4 times a day °

## 2017-03-17 NOTE — Telephone Encounter (Signed)
Stuck  r index  Finger  With  Long sharp  Dirty   Wire  It  Went  In  approx   1  Inch  Pt  Had  To  Pull  It  Out  .Happened  2  Days  Ago    The  Finger is tender  And  Slightly  Swollen    With  A  Reddish  Appearance. Pt UTD   On his   Tdap .    Pt  Advised  To  Go  To  An  Office   Today  [place]  Notified .   Reason for Disposition . Puncture wound from a sharp object that was very dirty  Answer Assessment - Initial Assessment Questions 1. MECHANISM: "How did the injury happen?"       Stuck  A   Small  Wire   r  Index  Finger    2. ONSET: "When did the injury happen?" (Minutes or hours ago)      2  Days  ago 3. LOCATION: "What part of the finger is injured?" "Is the nail damaged?"       Tip  Under  Last  Joint    4. APPEARANCE of the INJURY: "What does the injury look like?"      Puncture   Can  See  The  Hole   Puffy   Slightly  Red    5. SEVERITY: "Can you use the hand normally?"  "Can you bend your fingers into a ball and then fully open them?"      He  Is  Able to  [facility]  The  Finger    6. SIZE: For cuts, bruises, or swelling, ask: "How large is it?" (e.g., inches or centimeters;  entire finger)      Puncture  Wound  7. PAIN: "Is there pain?" If so, ask: "How bad is the pain?"    (e.g., Scale 1-10; or mild, moderate, severe)     mild 8. TETANUS: For any breaks in the skin, ask: "When was the last tetanus booster?"      Several  Years    9. OTHER SYMPTOMS: "Do you have any other symptoms?"      no 10. PREGNANCY: "Is there any chance you are pregnant?" "When was your last menstrual period?"       no  Answer Assessment - Initial Assessment Questions 1. LOCATION: "Where is the puncture located?"      r  haindex finger   2. OBJECT: "What was the object that punctured the skin?"      Wire   3. DEPTH: "How deep do you think the puncture goes?"      approx  1  inch 4. ONSET: "When did the injury occur?" (Minutes or hours)     2  Days   5. PAIN: "Is it painful?" If  so, ask: "How bad is the pain?"  (Scale 1-10; or mild, moderate, severe)     mild 6. TETANUS: "When was the last tetanus booster?"     2  Years  ago 7. PREGNANCY: "Is there any chance you are pregnant?" "When was your last menstrual period?"     no  Protocols used: PUNCTURE WOUND-A-AH, FINGER INJURY-A-AH

## 2017-04-13 ENCOUNTER — Other Ambulatory Visit: Payer: Self-pay | Admitting: Primary Care

## 2017-04-13 DIAGNOSIS — E119 Type 2 diabetes mellitus without complications: Secondary | ICD-10-CM

## 2017-04-13 DIAGNOSIS — Z1159 Encounter for screening for other viral diseases: Secondary | ICD-10-CM

## 2017-04-13 DIAGNOSIS — E785 Hyperlipidemia, unspecified: Secondary | ICD-10-CM

## 2017-04-14 ENCOUNTER — Ambulatory Visit: Payer: PPO | Admitting: Pulmonary Disease

## 2017-04-15 ENCOUNTER — Ambulatory Visit (INDEPENDENT_AMBULATORY_CARE_PROVIDER_SITE_OTHER): Payer: Medicare Other

## 2017-04-15 ENCOUNTER — Encounter: Payer: Self-pay | Admitting: Primary Care

## 2017-04-15 ENCOUNTER — Ambulatory Visit (INDEPENDENT_AMBULATORY_CARE_PROVIDER_SITE_OTHER): Payer: Medicare Other | Admitting: Primary Care

## 2017-04-15 VITALS — BP 160/94 | HR 65 | Temp 98.0°F | Ht 68.0 in | Wt 243.8 lb

## 2017-04-15 DIAGNOSIS — E785 Hyperlipidemia, unspecified: Secondary | ICD-10-CM

## 2017-04-15 DIAGNOSIS — E119 Type 2 diabetes mellitus without complications: Secondary | ICD-10-CM

## 2017-04-15 DIAGNOSIS — Z0000 Encounter for general adult medical examination without abnormal findings: Secondary | ICD-10-CM

## 2017-04-15 DIAGNOSIS — R200 Anesthesia of skin: Secondary | ICD-10-CM | POA: Diagnosis not present

## 2017-04-15 DIAGNOSIS — Z114 Encounter for screening for human immunodeficiency virus [HIV]: Secondary | ICD-10-CM | POA: Diagnosis not present

## 2017-04-15 DIAGNOSIS — Z1159 Encounter for screening for other viral diseases: Secondary | ICD-10-CM | POA: Diagnosis not present

## 2017-04-15 DIAGNOSIS — R202 Paresthesia of skin: Secondary | ICD-10-CM

## 2017-04-15 LAB — LIPID PANEL
Cholesterol: 184 mg/dL (ref 0–200)
HDL: 33.7 mg/dL — ABNORMAL LOW (ref >39.00)
NonHDL: 150.32
Total CHOL/HDL Ratio: 5
Triglycerides: 207 mg/dL — ABNORMAL HIGH (ref 0.0–149.0)
VLDL: 41.4 mg/dL — ABNORMAL HIGH (ref 0.0–40.0)

## 2017-04-15 LAB — COMPREHENSIVE METABOLIC PANEL
ALT: 28 U/L (ref 0–53)
AST: 18 U/L (ref 0–37)
Albumin: 3.8 g/dL (ref 3.5–5.2)
Alkaline Phosphatase: 71 U/L (ref 39–117)
BUN: 12 mg/dL (ref 6–23)
CO2: 32 mEq/L (ref 19–32)
Calcium: 9 mg/dL (ref 8.4–10.5)
Chloride: 101 mEq/L (ref 96–112)
Creatinine, Ser: 0.77 mg/dL (ref 0.40–1.50)
GFR: 109.05 mL/min (ref >60.00)
Glucose, Bld: 168 mg/dL — ABNORMAL HIGH (ref 70–99)
Potassium: 3.8 mEq/L (ref 3.5–5.1)
Sodium: 139 mEq/L (ref 135–145)
Total Bilirubin: 0.3 mg/dL (ref 0.2–1.2)
Total Protein: 6.9 g/dL (ref 6.0–8.3)

## 2017-04-15 LAB — CBC
HCT: 45.4 % (ref 39.0–52.0)
Hemoglobin: 15.3 g/dL (ref 13.0–17.0)
MCHC: 33.7 g/dL (ref 30.0–36.0)
MCV: 99.8 fl (ref 78.0–100.0)
Platelets: 427 10*3/uL — ABNORMAL HIGH (ref 150.0–400.0)
RBC: 4.55 Mil/uL (ref 4.22–5.81)
RDW: 13.5 % (ref 11.5–15.5)
WBC: 10.8 10*3/uL — ABNORMAL HIGH (ref 4.0–10.5)

## 2017-04-15 LAB — LDL CHOLESTEROL, DIRECT: Direct LDL: 132 mg/dL

## 2017-04-15 LAB — HEMOGLOBIN A1C: Hgb A1c MFr Bld: 6.6 % — ABNORMAL HIGH (ref 4.6–6.5)

## 2017-04-15 LAB — VITAMIN B12: Vitamin B-12: 145 pg/mL — ABNORMAL LOW (ref 211–911)

## 2017-04-15 NOTE — Patient Instructions (Signed)
Kenneth [name] , Thank you for taking time to come for your Medicare Wellness Visit. I appreciate your ongoing commitment to your health goals. Please review the following plan we discussed and let me know if I can assist you in the future.   These are the goals we discussed: Goals    . Follow up with Primary Care Provider     Starting 04/15/2017, I will continue to take medications as prescribed and to keep appointments with PCP as scheduled.        This is a list of the screening recommended for you and due dates:  Health Maintenance  Topic Date Due  . Pneumococcal vaccine (1) 05/01/2017*  . Flu Shot  05/01/2017*  . Complete foot exam   05/01/2017*  . Eye exam for diabetics  03/31/2018*  . Colon Cancer Screening  03/31/2018*  . Hemoglobin A1C  10/13/2017  . Tetanus Vaccine  10/09/2023  .  Hepatitis C: One time screening is recommended by Center for Disease Control  (CDC) for  adults born from [birth year] through 1965.   Completed  . HIV Screening  Completed  *Topic was postponed. The date shown is not the original due date.    Kenneth [name] , Thank you for taking time to come for your Medicare Wellness Visit. I appreciate your ongoing commitment to your health goals. Please review the following plan we discussed and let me know if I can assist you in the future.   These are the goals we discussed: Goals    . Follow up with Primary Care Provider     Starting 04/15/2017, I will continue to take medications as prescribed and to keep appointments with PCP as scheduled.        This is a list of the screening recommended for you and due dates:  Health Maintenance  Topic Date Due  . Pneumococcal vaccine (1) 05/01/2017*  . Flu Shot  05/01/2017*  . Complete foot exam   05/01/2017*  . Eye exam for diabetics  03/31/2018*  . Colon Cancer Screening  03/31/2018*  . Hemoglobin A1C  10/13/2017  . Tetanus Vaccine  10/09/2023  .  Hepatitis C: One time screening is recommended by Center for Disease  Control  (CDC) for  adults born from [birth year] through 1965.   Completed  . HIV Screening  Completed  *Topic was postponed. The date shown is not the original due date.   Preventive Care for Adults  A healthy lifestyle and preventive care can promote health and wellness. Preventive health guidelines for adults include the following key practices.  . A routine yearly physical is a good way to check with your health care provider about your health and preventive screening. It is a chance to share any concerns and updates on your health and to receive a thorough exam.  . Visit your dentist for a routine exam and preventive care every 6 months. Brush your teeth twice a day and floss once a day. Good oral hygiene prevents tooth decay and gum disease.  . The frequency of eye exams is based on your age, health, family medical history, use  of contact lenses, and other factors. Follow your health care provider's recommendations for frequency of eye exams.  . Eat a healthy diet. Foods like vegetables, fruits, whole grains, low-fat dairy products, and lean protein foods contain the nutrients you need without too many calories. Decrease your intake of foods high in solid fats, added sugars, and salt. Eat the right amount of  calories for you. Get information about a proper diet from your health care provider, if necessary.  . Regular physical exercise is one of the most important things you can do for your health. Most adults should get at least 150 minutes of moderate-intensity exercise (any activity that increases your heart rate and causes you to sweat) each week. In addition, most adults need muscle-strengthening exercises on 2 or more days a week.  Silver Sneakers may be a benefit available to you. To determine eligibility, you may visit the website: www.silversneakers.com or contact program at [phone] Mon-Fri between 8AM-8PM.   . Maintain a healthy weight. The body mass index (BMI) is a screening  tool to identify possible weight problems. It provides an estimate of body fat based on height and weight. Your health care provider can find your BMI and can help you achieve or maintain a healthy weight.   For adults 20 years and older: ? A BMI below 18.5 is considered underweight. ? A BMI of 18.5 to 24.9 is normal. ? A BMI of 25 to 29.9 is considered overweight. ? A BMI of 30 and above is considered obese.   . Maintain normal blood lipids and cholesterol levels by exercising and minimizing your intake of saturated fat. Eat a balanced diet with plenty of fruit and vegetables. Blood tests for lipids and cholesterol should begin at age 87 and be repeated every 5 years. If your lipid or cholesterol levels are high, you are over 50, or you are at high risk for heart disease, you may need your cholesterol levels checked more frequently. Ongoing high lipid and cholesterol levels should be treated with medicines if diet and exercise are not working.  . If you smoke, find out from your health care provider how to quit. If you do not use tobacco, please do not start.  . If you choose to drink alcohol, please do not consume more than 2 drinks per day. One drink is considered to be 12 ounces (355 mL) of beer, 5 ounces (148 mL) of wine, or 1.5 ounces (44 mL) of liquor.  . If you are 53-29 years old, ask your health care provider if you should take aspirin to prevent strokes.  . Use sunscreen. Apply sunscreen liberally and repeatedly throughout the day. You should seek shade when your shadow is shorter than you. Protect yourself by wearing long sleeves, pants, a wide-brimmed hat, and sunglasses year round, whenever you are outdoors.  . Once a month, do a whole body skin exam, using a mirror to look at the skin on your back. Tell your health care provider of new moles, moles that have irregular borders, moles that are larger than a pencil eraser, or moles that have changed in shape or color.

## 2017-04-15 NOTE — Patient Instructions (Addendum)
Stop by the lab prior to leaving today. I will notify you of your results once received.   Meet with Katha Cabal as scheduled, then follow up with me in one week to discuss your labs and complete your physical.   Stop by the front desk and speak with either Rosaria Ferries or Shirlean Mylar regarding your blood flow testing.   Please schedule an appointment with me in one week for your physical.  It was a pleasure to see you today!

## 2017-04-15 NOTE — Progress Notes (Signed)
Subjective:   Ido Wollman is a 62 y.o. male who presents for an Initial Medicare Annual Wellness Visit.  Review of Systems  N/A Cardiac Risk Factors include: advanced age (>44men, >29 women);male gender;obesity (BMI >30kg/m2);dyslipidemia;hypertension;smoking/ tobacco exposure    Objective:    Today's Vitals   04/15/17 1106  BP: (!) 160/94  Pulse: 65  Temp: 98 F (36.7 C)  TempSrc: Oral  SpO2: 95%  Weight: 243 lb 12 oz (110.6 kg)  Height: 5\' 8"  (1.727 m)  PainSc: 0-No pain   Body mass index is 37.06 kg/m.  Advanced Directives 04/15/2017 08/02/2016 06/15/2016 03/11/2016 04/26/2014  Does Patient Have a Medical Advance Directive? No No No No No  Would patient like information on creating a medical advance directive? Yes (MAU/Ambulatory/Procedural Areas - Information given) No - Patient declined - Yes (Inpatient - patient requests chaplain consult to create a medical advance directive);No - Patient declined No - patient declined information    Current Medications (verified) Outpatient Encounter Medications as of 04/15/2017  Medication Sig  . acetaminophen (TYLENOL) 500 MG tablet Take 500 mg by mouth every 6 (six) hours as needed (headache).  . Ascorbic Acid (VITAMIN C) 1000 MG tablet Take 1,000 mg by mouth daily.  Marland Kitchen atenolol (TENORMIN) 50 MG tablet Take 1 tablet (50 mg total) daily by mouth.  . diclofenac (VOLTAREN) 75 MG EC tablet   . hydrochlorothiazide (HYDRODIURIL) 25 MG tablet Take 1 tablet (25 mg total) daily by mouth.  Marland Kitchen HYDROcodone-acetaminophen (NORCO/VICODIN) 5-325 MG tablet   . lisinopril (PRINIVIL,ZESTRIL) 20 MG tablet Take 1 tablet (20 mg total) daily by mouth.  . methocarbamol (ROBAXIN) 500 MG tablet Take 500 mg by mouth daily as needed.   . Multiple Vitamins-Minerals (MULTIVITAMIN ADULT) TABS Take 1 tablet by mouth daily.  . Omega-3 Fatty Acids (OMEGA 3 PO) Take 1 capsule by mouth daily.  . sertraline (ZOLOFT) 50 MG tablet Take 1 tablet (50 mg total) daily by  mouth.  Marland Kitchen VITAMIN E PO Take 1 tablet by mouth daily.   No facility-administered encounter medications on file as of 04/15/2017.     Allergies (verified) Patient has no known allergies.   History: Past Medical History:  Diagnosis Date  . Anxiety and depression   . Bipolar 1 disorder ([place])   . Hypercholesterolemia   . Hypertension   . Type 2 diabetes mellitus ([place])    Past Surgical History:  Procedure Laterality Date  . L hand surgery    . R knee arthoroscopy    . SPLENECTOMY     Family History  Problem Relation Age of Onset  . Hypertension Mother   . Mental illness Mother   . Diabetes Mother   . Hypertension Father   . Lung cancer Father   . Diabetes Paternal Grandmother    Social History   Socioeconomic History  . Marital status: Divorced    Spouse name: None  . Number of children: None  . Years of education: None  . Highest education level: None  Social Needs  . Financial resource strain: None  . Food insecurity - worry: None  . Food insecurity - inability: None  . Transportation needs - medical: None  . Transportation needs - non-medical: None  Occupational History  . None  Tobacco Use  . Smoking status: Current Every Day Smoker    Packs/day: 0.50    Years: 41.00    Pack years: 20.50    Types: Cigarettes  . Smokeless tobacco: Never Used  Substance and Sexual  Activity  . Alcohol use: No  . Drug use: No  . Sexual activity: None  Other Topics Concern  . None  Social History Narrative   Single.   1 daughter, 1 grandchild.   Retired. Once worked in [employer].   Enjoys sports, racing, traveling.    Tobacco Counseling Ready to quit: No Counseling given: No   Clinical Intake:  Pre-visit preparation completed: Yes  Pain : No/denies pain Pain Score: 0-No pain     Nutritional Status: BMI > 30  Obese Nutritional Risks: None Diabetes: No CBG done?: No Did pt. bring in CBG monitor from home?: No  How often do you need to have someone help you when  you read instructions, pamphlets, or other written materials from your doctor or pharmacy?: 1 - Never What is the last grade level you completed in school?: 12th grade  Interpreter Needed?: No  Comments: pt lives with significant other Information entered by :: LPinson, LPN  Activities of Daily Living In your present state of health, do you have any difficulty performing the following activities: 04/15/2017  Hearing? N  Vision? N  Difficulty concentrating or making decisions? N  Walking or climbing stairs? N  Dressing or bathing? N  Doing errands, shopping? N  Preparing Food and eating ? N  Using the Toilet? N  In the past six months, have you accidently leaked urine? N  Do you have problems with loss of bowel control? N  Managing your Medications? N  Managing your Finances? N  Housekeeping or managing your Housekeeping? N  Some recent data might be hidden     Immunizations and Health Maintenance Immunization History  Administered Date(s) Administered  . Tdap 10/08/2013   There are no preventive care reminders to display for this patient.  Patient Care Team: Pleas Koch, NP as PCP - General (Internal Medicine) Unk Pinto, MD as Attending Physician (Internal Medicine)  Indicate any recent Medical Services you may have received from other than [facility] providers in the past year (date may be approximate).    Assessment:   This is a routine wellness examination for [place].  Hearing/Vision screen  Hearing Screening   125Hz  250Hz  500Hz  1000Hz  2000Hz  3000Hz  4000Hz  6000Hz  8000Hz   Right ear:   0 0 40  0    Left ear:   0 0 40  0      Visual Acuity Screening   Right eye Left eye Both eyes  Without correction:     With correction: 20/25-2 20/20-2 20/20-2    Dietary issues and exercise activities discussed: Current Exercise Habits: Home exercise routine, Type of exercise: Other - see comments;walking;stretching(basketball), Time (Minutes): 60, Frequency (Times/Week):  7, Weekly Exercise (Minutes/Week): 420, Exercise limited by: None identified  Goals    . Follow up with Primary Care Provider     Starting 04/15/2017, I will continue to take medications as prescribed and to keep appointments with PCP as scheduled.       Depression Screen PHQ 2/9 Scores 04/15/2017  PHQ - 2 Score 0  PHQ- 9 Score 3    Fall Risk Fall Risk  04/15/2017  Falls in the past year? No    Cognitive Function: MMSE - Mini Mental State Exam 04/15/2017  Orientation to time 5  Orientation to Place 5  Registration 3  Attention/ Calculation 0  Recall 3  Language- name 2 objects 0  Language- repeat 1  Language- follow 3 step command 3  Language- read & follow direction 0  Write  a sentence 0  Copy design 0  Total score 20     PLEASE NOTE: A Mini-Cog screen was completed. Maximum score is 20. A value of 0 denotes this part of [place] MMSE was not completed or the patient failed this part of the Mini-Cog screening.   Mini-Cog Screening Orientation to Time - Max 5 pts Orientation to Place - Max 5 pts Registration - Max 3 pts Recall - Max 3 pts Language Repeat - Max 1 pts Language Follow 3 Step Command - Max 3 pts     Screening Tests Health Maintenance  Topic Date Due  . PNEUMOCOCCAL POLYSACCHARIDE VACCINE (1) 05/01/2017 (Originally 12/16/1957)  . INFLUENZA VACCINE  05/01/2017 (Originally 10/30/2016)  . FOOT EXAM  05/01/2017 (Originally 12/16/1965)  . OPHTHALMOLOGY EXAM  03/31/2018 (Originally 12/16/1965)  . COLONOSCOPY  03/31/2018 (Originally 12/16/2005)  . HEMOGLOBIN A1C  10/13/2017  . TETANUS/TDAP  10/09/2023  . Hepatitis C Screening  Completed  . HIV Screening  Completed       Plan:     I have personally reviewed, addressed, and noted the following in the patient's chart:  A. Medical and social history B. Use of alcohol, tobacco or illicit drugs  C. Current medications and supplements D. Functional ability and status E.  Nutritional status F.  Physical  activity G. Advance directives H. List of other physicians I.  Hospitalizations, surgeries, and ER visits in previous 12 months J.  [place] to include hearing, vision, cognitive, depression L. Referrals and appointments - none  In addition, I have reviewed and discussed with patient certain preventive protocols, quality metrics, and best practice recommendations. A written personalized care plan for preventive services as well as general preventive health recommendations were provided to patient.  See attached scanned questionnaire for additional information.   Signed,   Lindell Noe, MHA, BS, LPN Health Coach

## 2017-04-15 NOTE — Progress Notes (Signed)
PCP notes:   Health maintenance:  Foot exam - PCP please address at next appt Flu vaccine - pt is ill today PNA vaccine - pt is ill today Eye exam - addressed; pt states he has future appt scheduled Colonoscopy - addressed; pt plans to contact GI to schedule appt A1C - completed HIV screening - completed Hep C screening - completed  Abnormal screenings:   Hearing - failed  Hearing Screening   125Hz  250Hz  500Hz  1000Hz  2000Hz  3000Hz  4000Hz  6000Hz  8000Hz   Right ear:   0 0 40  0    Left ear:   0 0 40  0     Patient concerns:   Patient is having respiratory concerns and leg pain. Pt had earlier appt with PCP to address concerns with leg.  Nurse concerns:  None  Next PCP appt:   04/24/2017 @ 0930

## 2017-04-15 NOTE — Progress Notes (Signed)
Pre visit review using our clinic review tool, if applicable. No additional management support is needed unless otherwise documented below in the visit note. 

## 2017-04-15 NOTE — Assessment & Plan Note (Signed)
Located to bilateral lower extremities from knee up to the thigh.  Neuro exam today unremarkable.  Consider MSK involvement.  Check labs today Including CBC, B12, A1c, CMP.  Order for ABIs placed to rule out vascular insufficiency given history of diabetes, hypertension and tobacco abuse.

## 2017-04-15 NOTE — Progress Notes (Signed)
Subjective:    Patient ID: Kenneth [name], male    DOB: April 23, 1955, 62 y.o.   MRN: [id number]  HPI  Kenneth [name] is a 62 year old male with a history of hypertension, hyperlipidemia, type 2 diabetes, tobacco abuse who presents today with a chief complaint of lower extremity numbness.  His numbness is located to the right and left lower extremity that is located to the knee and will radiate up to his mid thigh. His numbness is intermittent and is more noticeable to the right lower extremity. He has a history of this in the past but it dissipated and has not bothered him for years. His recent incidence occurred before Christmas while at the grocery store. He was checking out and attempted to step forward when his right leg felt numb and felt as though his leg was going to "give out".  He denies lower extremity pain, pain, numbness to upper extremities, back pain, changes in speech, facial numbness.   Review of Systems  Respiratory: Negative for shortness of breath.   Cardiovascular: Negative for chest pain.  Neurological: Positive for numbness. Negative for dizziness, speech difficulty, weakness and headaches.       Past Medical History:  Diagnosis Date  . Anxiety and depression   . Bipolar 1 disorder ([place])   . Hypercholesterolemia   . Hypertension   . Type 2 diabetes mellitus ([place])      Social History   Socioeconomic History  . Marital status: Divorced    Spouse name: Not on file  . Number of children: Not on file  . Years of education: Not on file  . Highest education level: Not on file  Social Needs  . Financial resource strain: Not on file  . Food insecurity - worry: Not on file  . Food insecurity - inability: Not on file  . Transportation needs - medical: Not on file  . Transportation needs - non-medical: Not on file  Occupational History  . Not on file  Tobacco Use  . Smoking status: Current Every Day Smoker    Packs/day: 0.50    Years: 41.00    Pack years: 20.50   Types: Cigarettes  . Smokeless tobacco: Never Used  Substance and Sexual Activity  . Alcohol use: No  . Drug use: No  . Sexual activity: Not on file  Other Topics Concern  . Not on file  Social History Narrative   Single.   1 daughter, 1 grandchild.   Retired. Once worked in [employer].   Enjoys sports, racing, traveling.     Past Surgical History:  Procedure Laterality Date  . L hand surgery    . R knee arthoroscopy    . SPLENECTOMY      Family History  Problem Relation Age of Onset  . Hypertension Mother   . Mental illness Mother   . Diabetes Mother   . Hypertension Father   . Lung cancer Father   . Diabetes Paternal Grandmother     No Known Allergies  Current Outpatient Medications on File Prior to Visit  Medication Sig Dispense Refill  . acetaminophen (TYLENOL) 500 MG tablet Take 500 mg by mouth every 6 (six) hours as needed (headache).    . Ascorbic Acid (VITAMIN C) 1000 MG tablet Take 1,000 mg by mouth daily.    Marland Kitchen atenolol (TENORMIN) 50 MG tablet Take 1 tablet (50 mg total) daily by mouth. 90 tablet 1  . diclofenac (VOLTAREN) 75 MG EC tablet     .  hydrochlorothiazide (HYDRODIURIL) 25 MG tablet Take 1 tablet (25 mg total) daily by mouth. 90 tablet 1  . lisinopril (PRINIVIL,ZESTRIL) 20 MG tablet Take 1 tablet (20 mg total) daily by mouth. 90 tablet 1  . methocarbamol (ROBAXIN) 500 MG tablet Take 500 mg by mouth daily as needed.     . Multiple Vitamins-Minerals (MULTIVITAMIN ADULT) TABS Take 1 tablet by mouth daily.    . Omega-3 Fatty Acids (OMEGA 3 PO) Take 1 capsule by mouth daily.    . sertraline (ZOLOFT) 50 MG tablet Take 1 tablet (50 mg total) daily by mouth. 90 tablet 0  . VITAMIN E PO Take 1 tablet by mouth daily.    Marland Kitchen HYDROcodone-acetaminophen (NORCO/VICODIN) 5-325 MG tablet      No current facility-administered medications on file prior to visit.     BP (!) 160/94   Pulse 65   Temp 98 F (36.7 C) (Oral)   Ht 5\' 8"  (1.727 m)   Wt 243 lb 12.8 oz (110.6 kg)    SpO2 95%   BMI 37.07 kg/m    Objective:   Physical Exam  Constitutional: He is oriented to person, place, and time. He appears well-nourished.  Eyes: EOM are normal. Pupils are equal, round, and reactive to light.  Cardiovascular: Normal rate and regular rhythm.  Pulses:      Dorsalis pedis pulses are 2+ on the right side, and 2+ on the left side.       Posterior tibial pulses are 2+ on the right side, and 2+ on the left side.  Pulmonary/Chest: Effort normal and breath sounds normal.  Neurological: He is alert and oriented to person, place, and time. No cranial nerve deficit.  Bilateral grips equal, no facial drooping, 5 out of 5 strength to upper and lower extremities.          Assessment & Plan:

## 2017-04-16 ENCOUNTER — Other Ambulatory Visit: Payer: Self-pay | Admitting: Primary Care

## 2017-04-16 DIAGNOSIS — R200 Anesthesia of skin: Secondary | ICD-10-CM

## 2017-04-16 DIAGNOSIS — R202 Paresthesia of skin: Principal | ICD-10-CM

## 2017-04-16 LAB — HEPATITIS C ANTIBODY
Hepatitis C Ab: NONREACTIVE
SIGNAL TO CUT-OFF: 0.05 (ref <1.00)

## 2017-04-16 LAB — HIV ANTIBODY (ROUTINE TESTING W REFLEX): HIV 1&2 Ab, 4th Generation: NONREACTIVE

## 2017-04-17 NOTE — Progress Notes (Signed)
I reviewed health advisor's note, was available for consultation, and agree with documentation and plan.  

## 2017-04-18 ENCOUNTER — Ambulatory Visit (HOSPITAL_COMMUNITY)
Admission: RE | Admit: 2017-04-18 | Discharge: 2017-04-18 | Disposition: A | Discharge status: Home / Self Care | Payer: Medicare Other | Source: Ambulatory Visit | Attending: Primary Care | Admitting: Primary Care

## 2017-04-18 DIAGNOSIS — R202 Paresthesia of skin: Secondary | ICD-10-CM | POA: Insufficient documentation

## 2017-04-18 DIAGNOSIS — R200 Anesthesia of skin: Secondary | ICD-10-CM

## 2017-04-24 ENCOUNTER — Ambulatory Visit (INDEPENDENT_AMBULATORY_CARE_PROVIDER_SITE_OTHER): Payer: Medicare Other | Admitting: Primary Care

## 2017-04-24 VITALS — BP 148/84 | HR 66 | Temp 98.0°F | Ht 68.0 in | Wt 243.1 lb

## 2017-04-24 DIAGNOSIS — R200 Anesthesia of skin: Secondary | ICD-10-CM

## 2017-04-24 DIAGNOSIS — I10 Essential (primary) hypertension: Secondary | ICD-10-CM | POA: Diagnosis not present

## 2017-04-24 DIAGNOSIS — E538 Deficiency of other specified B group vitamins: Secondary | ICD-10-CM

## 2017-04-24 DIAGNOSIS — Z0000 Encounter for general adult medical examination without abnormal findings: Secondary | ICD-10-CM | POA: Diagnosis not present

## 2017-04-24 DIAGNOSIS — G8929 Other chronic pain: Secondary | ICD-10-CM

## 2017-04-24 DIAGNOSIS — E119 Type 2 diabetes mellitus without complications: Secondary | ICD-10-CM

## 2017-04-24 DIAGNOSIS — G4733 Obstructive sleep apnea (adult) (pediatric): Secondary | ICD-10-CM

## 2017-04-24 DIAGNOSIS — Z23 Encounter for immunization: Secondary | ICD-10-CM | POA: Diagnosis not present

## 2017-04-24 DIAGNOSIS — F419 Anxiety disorder, unspecified: Secondary | ICD-10-CM | POA: Diagnosis not present

## 2017-04-24 DIAGNOSIS — M542 Cervicalgia: Secondary | ICD-10-CM

## 2017-04-24 DIAGNOSIS — K219 Gastro-esophageal reflux disease without esophagitis: Secondary | ICD-10-CM | POA: Diagnosis not present

## 2017-04-24 DIAGNOSIS — E785 Hyperlipidemia, unspecified: Secondary | ICD-10-CM

## 2017-04-24 DIAGNOSIS — Z1211 Encounter for screening for malignant neoplasm of colon: Secondary | ICD-10-CM | POA: Diagnosis not present

## 2017-04-24 DIAGNOSIS — F329 Major depressive disorder, single episode, unspecified: Secondary | ICD-10-CM

## 2017-04-24 DIAGNOSIS — F32A Depression, unspecified: Secondary | ICD-10-CM

## 2017-04-24 DIAGNOSIS — R202 Paresthesia of skin: Secondary | ICD-10-CM

## 2017-04-24 MED ORDER — METFORMIN HCL 500 MG PO TABS: 500.0000 mg | ORAL_TABLET | Freq: Two times a day (BID) | ORAL | 3 refills | Status: DC

## 2017-04-24 MED ORDER — ATORVASTATIN CALCIUM 20 MG PO TABS: 20.0000 mg | ORAL_TABLET | Freq: Every evening | ORAL | 3 refills | Status: DC

## 2017-04-24 MED ORDER — CYANOCOBALAMIN 1000 MCG/ML IJ SOLN
1000.0000 ug | Freq: Once | INTRAMUSCULAR | Status: AC
  Administered 2017-04-24: 1000 ug via INTRAMUSCULAR

## 2017-04-24 MED ORDER — LISINOPRIL 40 MG PO TABS: 40.0000 mg | ORAL_TABLET | Freq: Every day | ORAL | 3 refills | Status: DC

## 2017-04-24 NOTE — Assessment & Plan Note (Signed)
LDL above goal. Given diabetes will initiate atorvastatin 20 mg. Repeat lipids in 6 weeks. Discussed the importance of a healthy diet and regular exercise in order for weight loss, and to reduce the risk of any potential medical problems.

## 2017-04-24 NOTE — Assessment & Plan Note (Signed)
Pneumovax due but will be getting B 12 injection and influenza vaccination today. Will defer pneumonia vaccination until next visit. Last pneumonia vaccination provided 5 years ago. Discussed the importance of a healthy diet and regular exercise in order for weight loss, and to reduce the risk of any potential medical problems.  Colonoscopy due, referral placed. PSA negative. Hep C negative. Exam unremarkable. Labs with return of diabetes and hyperlipidemia. Follow up in 1 year for annual exam.

## 2017-04-24 NOTE — Assessment & Plan Note (Addendum)
Above goal on numerous visits, also with grocery store checks. Continue atenolol 50 mg once daily given HR of 66 today. Continue HCTZ 25 mg. Increase lisinopril to 40 mg.   Follow up in 2 weeks for repeat BMP and BP check.

## 2017-04-24 NOTE — Assessment & Plan Note (Signed)
Taking Alka-Selzter infrequently for symptoms. Continue same.

## 2017-04-24 NOTE — Patient Instructions (Addendum)
Blood Pressure:  We've increased your blood pressure medication to lisinopril 40 mg. You may take two of the 20 mg tablets until your current bottle is empty. Only take one of the 40 mg tablets when you get it from the pharmacy.  Continue taking hydrochlorothiazide 25 mg and atenolol 50 mg.  Diabetes:  Start taking Metformin 500 mg tablets. Start by taking one tablet once daily for 2 weeks, then increase to 1 tablet twice daily. This will help to prevent any stomach upset.  Cholesterol:  Start atorvastatin 20 mg tablets for high cholesterol. Take 1 tablet by mouth every evening at bedtime.  Vitamin B 12:  We will start you on B 12 injections once month for 6 months. Please tell the ladies to set you up for monthly injections.    It is important that you improve your diet. Please limit carbohydrates in the form of white bread, rice, pasta, sweets, fast food, fried food, sugary drinks, etc. Increase your consumption of fresh fruits and vegetables, whole grains, lean protein.  Ensure you are consuming 64 ounces of water daily.  Start exercising. You should be getting 150 minutes of moderate intensity exercise weekly.  Schedule an appointment for blood pressure check in 2 weeks.  It was a pleasure to see you today!

## 2017-04-24 NOTE — Assessment & Plan Note (Signed)
B 12 level low, will initiate B 12 injections monthly x 6 months. ABI's recently negative. Denies recent symptoms. Has stopped smoking.

## 2017-04-24 NOTE — Progress Notes (Signed)
Subjective:    Patient ID: Kenneth [name], male    DOB: November 05, 1955, 61 y.o.   MRN: [id number]  HPI  Kenneth [name] is a 62 year old male who presents today for complete physical.  Immunizations: -Tetanus: Completed in 2015 -Influenza: Due -Pneumonia: Due -Shingles: Never completed  Diet: Breakfast: Biscuit, eggs, bacon,  Lunch: Fast food Dinner: [employer], restaurants  Snacks: None Desserts: Occasionally  Beverages: Coffee, orange juice, little water  Exercise: He does not exercise Eye exam: Appointment scheduled Dental exam: Following annually Colonoscopy: Due PSA: Negative Hep C Screen: Negative   Review of Systems  Constitutional: Negative for unexpected weight change.  HENT: Negative for rhinorrhea.   Respiratory: Negative for cough and shortness of breath.   Cardiovascular: Negative for chest pain.  Gastrointestinal: Negative for constipation and diarrhea.  Genitourinary: Negative for difficulty urinating.  Musculoskeletal: Negative for arthralgias and myalgias.  Skin: Negative for rash.  Allergic/Immunologic: Negative for environmental allergies.  Neurological: Negative for dizziness, numbness and headaches.  Psychiatric/Behavioral:       Doing well on Zoloft.       Past Medical History:  Diagnosis Date  . Anxiety and depression   . Bipolar 1 disorder ([place])   . Hypercholesterolemia   . Hypertension   . Type 2 diabetes mellitus ([place])      Social History   Socioeconomic History  . Marital status: Divorced    Spouse name: Not on file  . Number of children: Not on file  . Years of education: Not on file  . Highest education level: Not on file  Social Needs  . Financial resource strain: Not on file  . Food insecurity - worry: Not on file  . Food insecurity - inability: Not on file  . Transportation needs - medical: Not on file  . Transportation needs - non-medical: Not on file  Occupational History  . Not on file  Tobacco Use  . Smoking status:  Current Every Day Smoker    Packs/day: 0.50    Years: 41.00    Pack years: 20.50    Types: Cigarettes  . Smokeless tobacco: Never Used  Substance and Sexual Activity  . Alcohol use: No  . Drug use: No  . Sexual activity: Not on file  Other Topics Concern  . Not on file  Social History Narrative   Single.   1 daughter, 1 grandchild.   Retired. Once worked in [employer].   Enjoys sports, racing, traveling.     Past Surgical History:  Procedure Laterality Date  . L hand surgery    . R knee arthoroscopy    . SPLENECTOMY      Family History  Problem Relation Age of Onset  . Hypertension Mother   . Mental illness Mother   . Diabetes Mother   . Hypertension Father   . Lung cancer Father   . Diabetes Paternal Grandmother     No Known Allergies  Current Outpatient Medications on File Prior to Visit  Medication Sig Dispense Refill  . acetaminophen (TYLENOL) 500 MG tablet Take 500 mg by mouth every 6 (six) hours as needed (headache).    . Ascorbic Acid (VITAMIN C) 1000 MG tablet Take 1,000 mg by mouth daily.    Marland Kitchen atenolol (TENORMIN) 50 MG tablet Take 1 tablet (50 mg total) daily by mouth. 90 tablet 1  . diclofenac (VOLTAREN) 75 MG EC tablet     . hydrochlorothiazide (HYDRODIURIL) 25 MG tablet Take 1 tablet (25 mg total) daily by  mouth. 90 tablet 1  . HYDROcodone-acetaminophen (NORCO/VICODIN) 5-325 MG tablet     . lisinopril (PRINIVIL,ZESTRIL) 20 MG tablet Take 1 tablet (20 mg total) daily by mouth. 90 tablet 1  . methocarbamol (ROBAXIN) 500 MG tablet Take 500 mg by mouth daily as needed.     . Multiple Vitamins-Minerals (MULTIVITAMIN ADULT) TABS Take 1 tablet by mouth daily.    . Omega-3 Fatty Acids (OMEGA 3 PO) Take 1 capsule by mouth daily.    . sertraline (ZOLOFT) 50 MG tablet Take 1 tablet (50 mg total) daily by mouth. 90 tablet 0  . VITAMIN E PO Take 1 tablet by mouth daily.     No current facility-administered medications on file prior to visit.     BP (!) 148/84   Pulse  66   Temp 98 F (36.7 C) (Oral)   Ht 5\' 8"  (1.727 m)   Wt 243 lb 1.9 oz (110.3 kg)   SpO2 96%   BMI 36.97 kg/m    Objective:   Physical Exam  Constitutional: He is oriented to person, place, and time. He appears well-nourished.  HENT:  Right Ear: Tympanic membrane and ear canal normal.  Left Ear: Tympanic membrane and ear canal normal.  Nose: Nose normal. Right sinus exhibits no maxillary sinus tenderness and no frontal sinus tenderness. Left sinus exhibits no maxillary sinus tenderness and no frontal sinus tenderness.  Mouth/Throat: Oropharynx is clear and moist.  Eyes: Conjunctivae and EOM are normal. Pupils are equal, round, and reactive to light.  Neck: Neck supple. Carotid bruit is not present. No thyromegaly present.  Cardiovascular: Normal rate, regular rhythm and normal heart sounds.  Pulmonary/Chest: Effort normal and breath sounds normal. He has no wheezes. He has no rales.  Abdominal: Soft. Bowel sounds are normal. There is no tenderness.  Musculoskeletal: Normal range of motion.  Neurological: He is alert and oriented to person, place, and time. He has normal reflexes. No cranial nerve deficit.  Skin: Skin is warm and dry.  Psychiatric: He has a normal mood and affect.          Assessment & Plan:

## 2017-04-24 NOTE — Assessment & Plan Note (Signed)
Doing well on Zoloft 50 mg, continue same. Denies SI/HI. 

## 2017-04-24 NOTE — Assessment & Plan Note (Signed)
Diagnosed with mild sleep apnea, due soon for re-evaluation with Dr. Halford Chessman.

## 2017-04-24 NOTE — Assessment & Plan Note (Addendum)
Recent A1C of 6.6 on recent labs. Start Metformin 500 mg BID. Will start with one tab once daily x 2 weeks to prevent GI effects. Pneumonia vaccination deferred until next visit. Initiated on statin today. Scheduled for eye exam soon. Discussed the importance of a healthy diet and regular exercise in order for weight loss, and to reduce the risk of any potential medical problems.  Repeat A1c in 3 months.

## 2017-04-24 NOTE — Assessment & Plan Note (Signed)
Managed on methocarbamol and diclofenac once daily on average. Following with orthopedics.

## 2017-04-28 ENCOUNTER — Encounter: Payer: Self-pay | Admitting: Gastroenterology

## 2017-05-05 ENCOUNTER — Ambulatory Visit: Payer: PPO | Admitting: Pulmonary Disease

## 2017-05-05 ENCOUNTER — Encounter: Payer: Self-pay | Admitting: Acute Care

## 2017-05-05 ENCOUNTER — Ambulatory Visit (INDEPENDENT_AMBULATORY_CARE_PROVIDER_SITE_OTHER): Payer: Medicare Other | Admitting: Acute Care

## 2017-05-05 VITALS — BP 134/72 | HR 93 | Ht 68.0 in | Wt 246.5 lb

## 2017-05-05 DIAGNOSIS — Z720 Tobacco use: Secondary | ICD-10-CM | POA: Diagnosis not present

## 2017-05-05 DIAGNOSIS — G4733 Obstructive sleep apnea (adult) (pediatric): Secondary | ICD-10-CM

## 2017-05-05 NOTE — Patient Instructions (Addendum)
It is nice to see you today. We will place an order for CPAP therapy Auto Set 5-15 cm H2O with heated humidity and mask of choice. Enroll in [facility] Continue on CPAP at bedtime. You appear to be benefiting from the treatment Goal is to wear for at least 6 hours each night for maximal clinical benefit. Continue to work on weight loss, as the link between excess weight  and sleep apnea is well established.  Do not drive if sleepy. Remember to clean mask, tubing, filter, and reservoir once weekly with soapy water.  Follow up with Dr. Halford Chessman or Judson Roch NP  In 30-90 days or before as needed.  Mask fitting per Lynnae Sandhoff. Follow up in 30-90 days after starting with you new machine. Please work on quitting smoking Please contact office for sooner follow up if symptoms do not improve or worsen or seek emergency care

## 2017-05-05 NOTE — Assessment & Plan Note (Signed)
OSA  Test Results: HST 02/07/17 >> AHI 20, SaO2 low 75% Recommendation per Dr. Halford Chessman: auto CPAP range 5 to 15 cm H2O with heated humidity and mask of choice.     Plan: We will place an order for CPAP therapy Auto Set 5-15 cm H2O with heated humidity and mask of choice. Enroll in [facility] Continue on CPAP at bedtime. You appear to be benefiting from the treatment Goal is to wear for at least 6 hours each night for maximal clinical benefit. Continue to work on weight loss, as the link between excess weight  and sleep apnea is well established.  Do not drive if sleepy. Remember to clean mask, tubing, filter, and reservoir once weekly with soapy water.  Follow up with Dr. Halford Chessman or Judson Roch NP  In 30-90 days or before as needed.  Mask fitting per Lynnae Sandhoff. Follow up in 30-90 days after starting with you new machine. Please work on quitting smoking Please contact office for sooner follow up if symptoms do not improve or worsen or seek emergency care

## 2017-05-05 NOTE — Assessment & Plan Note (Signed)
Please work on quitting smoking

## 2017-05-05 NOTE — Progress Notes (Signed)
History of Present Illness Kenneth [name] is a 62 y.o. male with OSA. He is followed by Dr. Halford Chessman.   05/05/2017 OV to discuss results of sleep study. Pt. Presents to discuss results of sleep study done 01/2017.He was seen by Dr. Halford Chessman 01/2017 for suspected sleep apnea. Home Sleep study confirms AHI of 20, with saturation low of 75%. Pt is here to discuss options for treatment. He  states he continues to have daytime sleepiness and awakening with headache in the mornings. He states he snores. We discussed the cardiac, and pulmonary  risks of not treating OSA. He is agreeable to starting therapy.   Test Results: HST 02/07/17 >> AHI 20, SaO2 low 75% Recommendation per Dr. Halford Chessman: auto CPAP range 5 to 15 cm H2O with heated humidity and mask of choice.    CBC Latest Ref Rng & Units 04/15/2017 09/10/2016 06/15/2016  WBC 4.0 - 10.5 K/uL 10.8(H) 12.5(H) -  Hemoglobin 13.0 - 17.0 g/dL 15.3 15.5 16.3  Hematocrit 39.0 - 52.0 % 45.4 45.9 48.0  Platelets 150.0 - 400.0 K/uL 427.0(H) 456.0(H) -    BMP Latest Ref Rng & Units 04/15/2017 09/10/2016 08/12/2016  Glucose 70 - 99 mg/dL 168(H) 102(H) 102(H)  BUN 6 - 23 mg/dL 12 12 13   Creatinine 0.40 - 1.50 mg/dL 0.77 0.84 0.94  Sodium 135 - 145 mEq/L 139 141 141  Potassium 3.5 - 5.1 mEq/L 3.8 3.8 3.7  Chloride 96 - 112 mEq/L 101 107 107  CO2 19 - 32 mEq/L 32 30 29  Calcium 8.4 - 10.5 mg/dL 9.0 9.2 9.3        Past medical hx Past Medical History:  Diagnosis Date  . Anxiety and depression   . Bipolar 1 disorder ([place])   . Hypercholesterolemia   . Hypertension   . Type 2 diabetes mellitus ([facility])      Social History   Tobacco Use  . Smoking status: Current Every Day Smoker    Packs/day: 0.50    Years: 41.00    Pack years: 20.50    Types: Cigarettes  . Smokeless tobacco: Never Used  Substance Use Topics  . Alcohol use: No  . Drug use: No    Mr.Palinkas reports that he has been smoking cigarettes.  He has a 20.50 pack-year smoking history. he has  never used smokeless tobacco. He reports that he does not drink alcohol or use drugs.  Tobacco Cessation: Current Every Day smoker>> states he quit x 1 week ago  Past surgical hx, Family hx, Social hx all reviewed.  Current Outpatient Medications on File Prior to Visit  Medication Sig  . acetaminophen (TYLENOL) 500 MG tablet Take 500 mg by mouth every 6 (six) hours as needed (headache).  . Ascorbic Acid (VITAMIN C) 1000 MG tablet Take 1,000 mg by mouth daily.  Marland Kitchen atenolol (TENORMIN) 50 MG tablet Take 1 tablet (50 mg total) daily by mouth.  Marland Kitchen atorvastatin (LIPITOR) 20 MG tablet Take 1 tablet (20 mg total) by mouth every evening.  . diclofenac (VOLTAREN) 75 MG EC tablet   . hydrochlorothiazide (HYDRODIURIL) 25 MG tablet Take 1 tablet (25 mg total) daily by mouth.  Marland Kitchen HYDROcodone-acetaminophen (NORCO/VICODIN) 5-325 MG tablet   . lisinopril (PRINIVIL,ZESTRIL) 40 MG tablet Take 1 tablet (40 mg total) by mouth daily.  . metFORMIN (GLUCOPHAGE) 500 MG tablet Take 1 tablet (500 mg total) by mouth 2 (two) times daily with a meal.  . methocarbamol (ROBAXIN) 500 MG tablet Take 500 mg by mouth daily as  needed.   . Multiple Vitamins-Minerals (MULTIVITAMIN ADULT) TABS Take 1 tablet by mouth daily.  . Omega-3 Fatty Acids (OMEGA 3 PO) Take 1 capsule by mouth daily.  . sertraline (ZOLOFT) 50 MG tablet Take 1 tablet (50 mg total) daily by mouth.  Marland Kitchen VITAMIN E PO Take 1 tablet by mouth daily.   No current facility-administered medications on file prior to visit.      No Known Allergies  Review Of Systems:  Constitutional:   No  weight loss, night sweats,  Fevers, chills, fatigue, or  lassitude.  HEENT:   No headaches,  Difficulty swallowing,  Tooth/dental problems, or  Sore throat,                No sneezing, itching, ear ache, nasal congestion, post nasal drip,   CV:  No chest pain,  Orthopnea, PND, swelling in lower extremities, anasarca, dizziness, palpitations, syncope.   GI  No heartburn,  indigestion, abdominal pain, nausea, vomiting, diarrhea, change in bowel habits, loss of appetite, bloody stools.   Resp: No shortness of breath with exertion or at rest.  No excess mucus, no productive cough,  No non-productive cough,  No coughing up of blood.  No change in color of mucus.  No wheezing.  No chest wall deformity  Skin: no rash or lesions.  GU: no dysuria, change in color of urine, no urgency or frequency.  No flank pain, no hematuria   MS:  No joint pain or swelling.  No decreased range of motion.  No back pain.  Psych:  No change in mood or affect. No depression or anxiety.  No memory loss.   Vital Signs BP 134/72 (BP Location: Left Arm, Cuff Size: Normal)   Pulse 93   Ht 5\' 8"  (1.727 m)   Wt 246 lb 8 oz (111.8 kg)   SpO2 98%   BMI 37.48 kg/m    Physical Exam:  General- No distress,  A&Ox3, pleasant ENT: No sinus tenderness, TM clear, pale nasal mucosa, no oral exudate,no post nasal drip, no LAN Cardiac: S1, S2, regular rate and rhythm, no murmur Chest: No wheeze/ rales/ dullness; no accessory muscle use, no nasal flaring, no sternal retractions Abd.: Soft Non-tender, non-distended, obese Ext: No clubbing cyanosis, edema Neuro:  normal strength Skin: No rashes, warm and dry Psych: normal mood and behavior   Assessment/Plan  OSA (obstructive sleep apnea) OSA  Test Results: HST 02/07/17 >> AHI 20, SaO2 low 75% Recommendation per Dr. Halford Chessman: auto CPAP range 5 to 15 cm H2O with heated humidity and mask of choice.     Plan: We will place an order for CPAP therapy Auto Set 5-15 cm H2O with heated humidity and mask of choice. Enroll in [facility] Continue on CPAP at bedtime. You appear to be benefiting from the treatment Goal is to wear for at least 6 hours each night for maximal clinical benefit. Continue to work on weight loss, as the link between excess weight  and sleep apnea is well established.  Do not drive if sleepy. Remember to clean mask,  tubing, filter, and reservoir once weekly with soapy water.  Follow up with Dr. Halford Chessman or Judson Roch NP  In 30-90 days or before as needed.  Mask fitting per Lynnae Sandhoff. Follow up in 30-90 days after starting with you new machine. Please work on quitting smoking Please contact office for sooner follow up if symptoms do not improve or worsen or seek emergency care    Tobacco abuse Please work on  quitting smoking    Magdalen Spatz, NP 05/05/2017  11:45 AM

## 2017-05-08 ENCOUNTER — Ambulatory Visit (INDEPENDENT_AMBULATORY_CARE_PROVIDER_SITE_OTHER): Payer: Medicare Other | Admitting: Primary Care

## 2017-05-08 ENCOUNTER — Encounter: Payer: Self-pay | Admitting: Primary Care

## 2017-05-08 VITALS — BP 124/78 | HR 85 | Temp 98.0°F | Ht 68.0 in | Wt 244.8 lb

## 2017-05-08 DIAGNOSIS — E785 Hyperlipidemia, unspecified: Secondary | ICD-10-CM | POA: Diagnosis not present

## 2017-05-08 DIAGNOSIS — E119 Type 2 diabetes mellitus without complications: Secondary | ICD-10-CM | POA: Diagnosis not present

## 2017-05-08 DIAGNOSIS — I10 Essential (primary) hypertension: Secondary | ICD-10-CM | POA: Diagnosis not present

## 2017-05-08 LAB — BASIC METABOLIC PANEL
BUN: 17 mg/dL (ref 6–23)
CO2: 31 mEq/L (ref 19–32)
Calcium: 9.1 mg/dL (ref 8.4–10.5)
Chloride: 101 mEq/L (ref 96–112)
Creatinine, Ser: 0.88 mg/dL (ref 0.40–1.50)
GFR: 93.45 mL/min (ref >60.00)
Glucose, Bld: 258 mg/dL — ABNORMAL HIGH (ref 70–99)
Potassium: 4.2 mEq/L (ref 3.5–5.1)
Sodium: 136 mEq/L (ref 135–145)

## 2017-05-08 NOTE — Patient Instructions (Addendum)
Stop by the lab prior to leaving today. I will notify you of your results once received.   Continue lisinopril 40 mg, hydrochlorothiazide 25 mg, and atenolol 50 mg everyday.  Start exercising. You should be getting 150 minutes of moderate intensity exercise weekly.  It's important to improve your diet by reducing consumption of fast food, fried food, processed snack foods, sugary drinks. Increase consumption of fresh vegetables and fruits, whole grains, water.  Ensure you are drinking 64 ounces of water daily.  Schedule a lab only appointment after April 24th with a follow up office visit 1-2 days after.  It was a pleasure to see you today!   DASH Eating Plan DASH stands for "Dietary Approaches to Stop Hypertension." The DASH eating plan is a healthy eating plan that has been shown to reduce high blood pressure (hypertension). It may also reduce your risk for type 2 diabetes, heart disease, and stroke. The DASH eating plan may also help with weight loss. What are tips for following this plan? General guidelines  Avoid eating more than 2,300 mg (milligrams) of salt (sodium) a day. If you have hypertension, you may need to reduce your sodium intake to 1,500 mg a day.  Limit alcohol intake to no more than 1 drink a day for nonpregnant women and 2 drinks a day for men. One drink equals 12 oz of beer, 5 oz of wine, or 1 oz of hard liquor.  Work with your health care provider to maintain a healthy body weight or to lose weight. Ask what an ideal weight is for you.  Get at least 30 minutes of exercise that causes your heart to beat faster (aerobic exercise) most days of the week. Activities may include walking, swimming, or biking.  Work with your health care provider or diet and nutrition specialist (dietitian) to adjust your eating plan to your individual calorie needs. Reading food labels  Check food labels for the amount of sodium per serving. Choose foods with less than 5 percent of the  Daily Value of sodium. Generally, foods with less than 300 mg of sodium per serving fit into this eating plan.  To find whole grains, look for the word "whole" as the first word in the ingredient list. Shopping  Buy products labeled as "low-sodium" or "no salt added."  Buy fresh foods. Avoid canned foods and premade or frozen meals. Cooking  Avoid adding salt when cooking. Use salt-free seasonings or herbs instead of table salt or sea salt. Check with your health care provider or pharmacist before using salt substitutes.  Do not fry foods. Cook foods using healthy methods such as baking, boiling, grilling, and broiling instead.  Cook with heart-healthy oils, such as olive, canola, soybean, or sunflower oil. Meal planning   Eat a balanced diet that includes: ? 5 or more servings of fruits and vegetables each day. At each meal, try to fill half of your plate with fruits and vegetables. ? Up to 6-8 servings of whole grains each day. ? Less than 6 oz of lean meat, poultry, or fish each day. A 3-oz serving of meat is about the same size as a deck of cards. One egg equals 1 oz. ? 2 servings of low-fat dairy each day. ? A serving of nuts, seeds, or beans 5 times each week. ? Heart-healthy fats. Healthy fats called Omega-3 fatty acids are found in foods such as flaxseeds and coldwater fish, like sardines, salmon, and mackerel.  Limit how much you eat of the  following: ? Canned or prepackaged foods. ? Food that is high in trans fat, such as fried foods. ? Food that is high in saturated fat, such as fatty meat. ? Sweets, desserts, sugary drinks, and other foods with added sugar. ? Full-fat dairy products.  Do not salt foods before eating.  Try to eat at least 2 vegetarian meals each week.  Eat more home-cooked food and less restaurant, buffet, and fast food.  When eating at a restaurant, ask that your food be prepared with less salt or no salt, if possible. What foods are  recommended? The items listed may not be a complete list. Talk with your dietitian about what dietary choices are best for you. Grains Whole-grain or whole-wheat bread. Whole-grain or whole-wheat pasta. Brown rice. [place]. Bulgur. Whole-grain and low-sodium cereals. Pita bread. Low-fat, low-sodium crackers. Whole-wheat flour tortillas. Vegetables Fresh or frozen vegetables (raw, steamed, roasted, or grilled). Low-sodium or reduced-sodium tomato and vegetable juice. Low-sodium or reduced-sodium tomato sauce and tomato paste. Low-sodium or reduced-sodium canned vegetables. Fruits All fresh, dried, or frozen fruit. Canned fruit in natural juice (without added sugar). Meat and other protein foods Skinless chicken or [place]. Ground chicken or [place]. Pork with fat trimmed off. Fish and seafood. Egg whites. Dried beans, peas, or lentils. Unsalted nuts, nut butters, and seeds. Unsalted canned beans. Lean cuts of beef with fat trimmed off. Low-sodium, lean deli meat. Dairy Low-fat (1%) or fat-free (skim) milk. Fat-free, low-fat, or reduced-fat cheeses. Nonfat, low-sodium ricotta or cottage cheese. Low-fat or nonfat yogurt. Low-fat, low-sodium cheese. Fats and oils Soft margarine without trans fats. Vegetable oil. Low-fat, reduced-fat, or light mayonnaise and salad dressings (reduced-sodium). Canola, safflower, olive, soybean, and sunflower oils. Avocado. Seasoning and other foods Herbs. Spices. Seasoning mixes without salt. Unsalted popcorn and pretzels. Fat-free sweets. What foods are not recommended? The items listed may not be a complete list. Talk with your dietitian about what dietary choices are best for you. Grains Baked goods made with fat, such as croissants, muffins, or some breads. Dry pasta or rice meal packs. Vegetables Creamed or fried vegetables. Vegetables in a cheese sauce. Regular canned vegetables (not low-sodium or reduced-sodium). Regular canned tomato sauce and paste (not  low-sodium or reduced-sodium). Regular tomato and vegetable juice (not low-sodium or reduced-sodium). Angie Fava. Olives. Fruits Canned fruit in a light or heavy syrup. Fried fruit. Fruit in cream or butter sauce. Meat and other protein foods Fatty cuts of meat. Ribs. Fried meat. Berniece Salines. Sausage. Bologna and other processed lunch meats. Salami. Fatback. Hotdogs. Bratwurst. Salted nuts and seeds. Canned beans with added salt. Canned or smoked fish. Whole eggs or egg yolks. Chicken or [place] with skin. Dairy Whole or 2% milk, cream, and half-and-half. Whole or full-fat cream cheese. Whole-fat or sweetened yogurt. Full-fat cheese. Nondairy creamers. Whipped toppings. Processed cheese and cheese spreads. Fats and oils Butter. Stick margarine. Lard. Shortening. Ghee. Bacon fat. Tropical oils, such as coconut, palm kernel, or palm oil. Seasoning and other foods Salted popcorn and pretzels. Onion salt, garlic salt, seasoned salt, table salt, and sea salt. Worcestershire sauce. Tartar sauce. Barbecue sauce. Teriyaki sauce. Soy sauce, including reduced-sodium. Steak sauce. Canned and packaged gravies. Fish sauce. Oyster sauce. Cocktail sauce. Horseradish that you find on the shelf. Ketchup. Mustard. Meat flavorings and tenderizers. Bouillon cubes. Hot sauce and Tabasco sauce. Premade or packaged marinades. Premade or packaged taco seasonings. Relishes. Regular salad dressings. Where to find more information:  National Heart, Lung, and [place]: https://wilson-eaton.com/  American Heart Association: www.heart.org  Summary  The DASH eating plan is a healthy eating plan that has been shown to reduce high blood pressure (hypertension). It may also reduce your risk for type 2 diabetes, heart disease, and stroke.  With the DASH eating plan, you should limit salt (sodium) intake to 2,300 mg a day. If you have hypertension, you may need to reduce your sodium intake to 1,500 mg a day.  When on the DASH eating plan,  aim to eat more fresh fruits and vegetables, whole grains, lean proteins, low-fat dairy, and heart-healthy fats.  Work with your health care provider or diet and nutrition specialist (dietitian) to adjust your eating plan to your individual calorie needs. This information is not intended to replace advice given to you by your health care provider. Make sure you discuss any questions you have with your health care provider. Document Released: 03/07/2011 Document Revised: 03/11/2016 Document Reviewed: 03/11/2016 Elsevier Interactive Patient Education  [employer].

## 2017-05-08 NOTE — Progress Notes (Signed)
Subjective:    Patient ID: Kenneth [name], male    DOB: [birth year], 62 y.o.   MRN: [id number]  HPI  Kenneth [name] is a 62 year old male who presents today for follow up of hypertension.  He is currently managed on lisinopril 40 mg, atenolol 50 mg, HCTZ 25 mg. During his last visit we increased lisinopril from 20 mg to 40 mg given continued elevated BP readings during visits.  Since his last visit he's compliant to his medication. He denies dizziness, chest pain, shortness of breath. He's checking his blood pressure at home which is running 120s-130s/70's-80's. He stopped smoking 3 weeks ago.   BP Readings from Last 3 Encounters:  05/08/17 124/78  05/05/17 134/72  04/24/17 (!) 148/84     Review of Systems  Constitutional: Negative for fatigue.  Respiratory: Negative for shortness of breath.   Cardiovascular: Negative for chest pain.  Neurological: Negative for dizziness, weakness and headaches.       Past Medical History:  Diagnosis Date  . Anxiety and depression   . Bipolar 1 disorder ([place])   . Hypercholesterolemia   . Hypertension   . Type 2 diabetes mellitus ([place])      Social History   Socioeconomic History  . Marital status: Divorced    Spouse name: Not on file  . Number of children: Not on file  . Years of education: Not on file  . Highest education level: Not on file  Social Needs  . Financial resource strain: Not on file  . Food insecurity - worry: Not on file  . Food insecurity - inability: Not on file  . Transportation needs - medical: Not on file  . Transportation needs - non-medical: Not on file  Occupational History  . Not on file  Tobacco Use  . Smoking status: Current Every Day Smoker    Packs/day: 0.50    Years: 41.00    Pack years: 20.50    Types: Cigarettes  . Smokeless tobacco: Never Used  Substance and Sexual Activity  . Alcohol use: No  . Drug use: No  . Sexual activity: Not on file  Other Topics Concern  . Not on file  Social History  Narrative   Single.   1 daughter, 1 grandchild.   Retired. Once worked in [employer].   Enjoys sports, racing, traveling.     Past Surgical History:  Procedure Laterality Date  . L hand surgery    . R knee arthoroscopy    . SPLENECTOMY      Family History  Problem Relation Age of Onset  . Hypertension Mother   . Mental illness Mother   . Diabetes Mother   . Hypertension Father   . Lung cancer Father   . Diabetes Paternal Grandmother     No Known Allergies  Current Outpatient Medications on File Prior to Visit  Medication Sig Dispense Refill  . acetaminophen (TYLENOL) 500 MG tablet Take 500 mg by mouth every 6 (six) hours as needed (headache).    . Ascorbic Acid (VITAMIN C) 1000 MG tablet Take 1,000 mg by mouth daily.    Marland Kitchen atenolol (TENORMIN) 50 MG tablet Take 1 tablet (50 mg total) daily by mouth. 90 tablet 1  . diclofenac (VOLTAREN) 75 MG EC tablet     . hydrochlorothiazide (HYDRODIURIL) 25 MG tablet Take 1 tablet (25 mg total) daily by mouth. 90 tablet 1  . HYDROcodone-acetaminophen (NORCO/VICODIN) 5-325 MG tablet     . lisinopril (PRINIVIL,ZESTRIL) 40 MG tablet Take  1 tablet (40 mg total) by mouth daily. 90 tablet 3  . methocarbamol (ROBAXIN) 500 MG tablet Take 500 mg by mouth daily as needed.     . Multiple Vitamins-Minerals (MULTIVITAMIN ADULT) TABS Take 1 tablet by mouth daily.    . Omega-3 Fatty Acids (OMEGA 3 PO) Take 1 capsule by mouth daily.    . sertraline (ZOLOFT) 50 MG tablet Take 1 tablet (50 mg total) daily by mouth. 90 tablet 0  . VITAMIN E PO Take 1 tablet by mouth daily.    Marland Kitchen atorvastatin (LIPITOR) 20 MG tablet Take 1 tablet (20 mg total) by mouth every evening. (Patient not taking: Reported on 05/08/2017) 90 tablet 3  . metFORMIN (GLUCOPHAGE) 500 MG tablet Take 1 tablet (500 mg total) by mouth 2 (two) times daily with a meal. (Patient not taking: Reported on 05/08/2017) 180 tablet 3   No current facility-administered medications on file prior to visit.     BP  124/78   Pulse 85   Temp 98 F (36.7 C) (Oral)   Ht 5\' 8"  (1.727 m)   Wt 244 lb 12.8 oz (111 kg)   SpO2 94%   BMI 37.22 kg/m    Objective:   Physical Exam  Constitutional: He is oriented to person, place, and time. He appears well-nourished.  Neck: Neck supple.  Cardiovascular: Normal rate and regular rhythm.  Pulmonary/Chest: Effort normal and breath sounds normal. He has no wheezes. He has no rales.  Neurological: He is alert and oriented to person, place, and time.  Skin: Skin is warm and dry.          Assessment & Plan:

## 2017-05-08 NOTE — Assessment & Plan Note (Signed)
Improved with the increase of lisinopril to 40 mg.  Continue lisinopril, hydrochlorothiazide, atenolol.  Repeat BMP pending today.

## 2017-05-09 ENCOUNTER — Telehealth: Payer: Self-pay | Admitting: Acute Care

## 2017-05-09 DIAGNOSIS — G4733 Obstructive sleep apnea (adult) (pediatric): Secondary | ICD-10-CM | POA: Diagnosis not present

## 2017-05-09 NOTE — Telephone Encounter (Signed)
Called pt and advised message from the provider. Pt understood and verbalized understanding. Nothing further is needed.    

## 2017-05-09 NOTE — Telephone Encounter (Signed)
If he is satisfied with his  Mask fit , he does not need to go. We can re-evaluate after looking at his next down Load. Please call him, give him the option, and cancel the mask fitting with Lynnae Sandhoff if the patient does not feel he needs it. Thanks so much.

## 2017-05-09 NOTE — Telephone Encounter (Signed)
DG do you still want him to see Lynnae Sandhoff?

## 2017-05-14 ENCOUNTER — Other Ambulatory Visit: Payer: Self-pay | Admitting: Primary Care

## 2017-05-14 DIAGNOSIS — F329 Major depressive disorder, single episode, unspecified: Secondary | ICD-10-CM

## 2017-05-14 DIAGNOSIS — F32A Depression, unspecified: Secondary | ICD-10-CM

## 2017-05-14 DIAGNOSIS — F419 Anxiety disorder, unspecified: Principal | ICD-10-CM

## 2017-05-19 ENCOUNTER — Other Ambulatory Visit (HOSPITAL_BASED_OUTPATIENT_CLINIC_OR_DEPARTMENT_OTHER): Payer: Medicare Other

## 2017-05-27 ENCOUNTER — Ambulatory Visit: Payer: Medicare Other

## 2017-06-06 DIAGNOSIS — G4733 Obstructive sleep apnea (adult) (pediatric): Secondary | ICD-10-CM | POA: Diagnosis not present

## 2017-06-08 ENCOUNTER — Ambulatory Visit (HOSPITAL_COMMUNITY)
Admission: EM | Admit: 2017-06-08 | Discharge: 2017-06-08 | Disposition: A | Discharge status: Home / Self Care | Payer: Medicare Other | Attending: Internal Medicine | Admitting: Internal Medicine

## 2017-06-08 ENCOUNTER — Encounter (HOSPITAL_COMMUNITY): Payer: Self-pay | Admitting: Emergency Medicine

## 2017-06-08 DIAGNOSIS — J22 Unspecified acute lower respiratory infection: Secondary | ICD-10-CM

## 2017-06-08 MED ORDER — AZITHROMYCIN 250 MG PO TABS: ORAL_TABLET | ORAL | 0 refills | Status: AC

## 2017-06-08 MED ORDER — ALBUTEROL SULFATE HFA 108 (90 BASE) MCG/ACT IN AERS: 1.0000 | INHALATION_SPRAY | Freq: Four times a day (QID) | RESPIRATORY_TRACT | 0 refills | Status: DC | PRN

## 2017-06-08 NOTE — ED Provider Notes (Signed)
[facility]    CSN: [id number] Arrival date & time: 06/08/17  1952     History   Chief Complaint Chief Complaint  Patient presents with  . URI    HPI Kenneth [name] is a 62 y.o. male.   [name] presents with worsening of cough congestion. Symptoms started last week with fever, cough and congestion. Improved with alkaselzer and nyquil. however, over the past two days symptoms have returned. Fatigue now. Low grade fevers have returned. Cough is productive. Wheezing at times. Chest feels tight with mild shortness of breath. Mild ear pressure with congestion. No known specific ill contacts No longer smokes. No history of COPD. History of htn, diabetes, osa, gerd, splenectomy.   ROS per HPI.       Past Medical History:  Diagnosis Date  . Anxiety and depression   . Bipolar 1 disorder ([place])   . Hypercholesterolemia   . Hypertension   . Type 2 diabetes mellitus [facility])     Patient Active Problem List   Diagnosis Date Noted  . Tobacco abuse 05/05/2017  . Preventative health care 04/24/2017  . Numbness and tingling 04/15/2017  . OSA (obstructive sleep apnea) 02/07/2017  . Snoring 10/22/2016  . Nausea 09/10/2016  . Epigastric abdominal tenderness without rebound tenderness 09/10/2016  . GERD without esophagitis 09/10/2016  . Nonspecific abnormal electrocardiogram (ECG) (EKG) 09/10/2016  . Lower extremity edema 08/12/2016  . Type 2 diabetes mellitus without complication, without long-term current use of insulin ([place]) 06/26/2016  . Anxiety and depression 06/26/2016  . Hypertension 06/26/2016  . Hyperlipidemia 06/26/2016  . Chronic neck pain 06/26/2016    Past Surgical History:  Procedure Laterality Date  . L hand surgery    . R knee arthoroscopy    . SPLENECTOMY         Home Medications    Prior to Admission medications   Medication Sig Start Date End Date Taking? Authorizing Provider  acetaminophen (TYLENOL) 500 MG tablet Take 500 mg by mouth every 6  (six) hours as needed (headache).    [provider]  albuterol (PROVENTIL HFA;VENTOLIN HFA) 108 (90 Base) MCG/ACT inhaler Inhale 1-2 puffs into the lungs every 6 (six) hours as needed for wheezing or shortness of breath. 06/08/17   Zigmund Gottron, NP  Ascorbic Acid (VITAMIN C) 1000 MG tablet Take 1,000 mg by mouth daily.    [provider]  atenolol (TENORMIN) 50 MG tablet Take 1 tablet (50 mg total) daily by mouth. 02/06/17   Pleas Koch, NP  atorvastatin (LIPITOR) 20 MG tablet Take 1 tablet (20 mg total) by mouth every evening. Patient not taking: Reported on 05/08/2017 04/24/17   Pleas Koch, NP  azithromycin (ZITHROMAX) 250 MG tablet Take 2 tablets (500 mg total) by mouth daily for 1 day, THEN 1 tablet (250 mg total) daily for 4 days. 06/08/17 06/13/17  Zigmund Gottron, NP  diclofenac (VOLTAREN) 75 MG EC tablet  04/11/17   [provider]  hydrochlorothiazide (HYDRODIURIL) 25 MG tablet Take 1 tablet (25 mg total) daily by mouth. 02/06/17   Pleas Koch, NP  HYDROcodone-acetaminophen (NORCO/VICODIN) 5-325 MG tablet  12/30/16   [provider]  lisinopril (PRINIVIL,ZESTRIL) 40 MG tablet Take 1 tablet (40 mg total) by mouth daily. 04/24/17   Pleas Koch, NP  metFORMIN (GLUCOPHAGE) 500 MG tablet Take 1 tablet (500 mg total) by mouth 2 (two) times daily with a meal. Patient not taking: Reported on 05/08/2017 04/24/17   Carlis Abbott,  Leticia Penna, NP  methocarbamol (ROBAXIN) 500 MG tablet Take 500 mg by mouth daily as needed.  04/11/17   [provider]  Multiple Vitamins-Minerals (MULTIVITAMIN ADULT) TABS Take 1 tablet by mouth daily.    [provider]  Omega-3 Fatty Acids (OMEGA 3 PO) Take 1 capsule by mouth daily.    [provider]  sertraline (ZOLOFT) 50 MG tablet Take 1 tablet (50 mg total) daily by mouth. 05/14/17   Pleas Koch, NP  VITAMIN E PO Take 1 tablet by mouth daily.    [provider]    Family  History Family History  Problem Relation Age of Onset  . Hypertension Mother   . Mental illness Mother   . Diabetes Mother   . Hypertension Father   . Lung cancer Father   . Diabetes Paternal Grandmother     Social History Social History   Tobacco Use  . Smoking status: Current Every Day Smoker    Packs/day: 0.50    Years: 41.00    Pack years: 20.50    Types: Cigarettes  . Smokeless tobacco: Never Used  Substance Use Topics  . Alcohol use: No  . Drug use: No     Allergies   Patient has no known allergies.   Review of Systems Review of Systems   Physical Exam Triage Vital Signs ED Triage Vitals [06/08/17 2039]  Enc Vitals Group     BP (!) 171/103     Pulse Rate 86     Resp 18     Temp 99 F (37.2 C)     Temp Source Oral     SpO2 96 %     Weight      Height      Head Circumference      Peak Flow      Pain Score      Pain Loc      Pain Edu?      Excl. in [place]?    No data found.  Updated Vital Signs BP (!) 171/103 (BP Location: Right Arm)   Pulse 86   Temp 99 F (37.2 C) (Oral)   Resp 18   SpO2 96%   Visual Acuity Right Eye Distance:   Left Eye Distance:   Bilateral Distance:    Right Eye Near:   Left Eye Near:    Bilateral Near:     Physical Exam  Constitutional: He is oriented to person, place, and time. He appears well-developed and well-nourished.  HENT:  Head: Normocephalic and atraumatic.  Right Ear: Tympanic membrane, external ear and ear canal normal.  Left Ear: Tympanic membrane, external ear and ear canal normal.  Nose: Rhinorrhea present. Right sinus exhibits no maxillary sinus tenderness and no frontal sinus tenderness. Left sinus exhibits no maxillary sinus tenderness and no frontal sinus tenderness.  Mouth/Throat: Uvula is midline, oropharynx is clear and moist and mucous membranes are normal.  Eyes: Conjunctivae are normal. Pupils are equal, round, and reactive to light.  Neck: Normal range of motion.  Cardiovascular: Normal  rate and regular rhythm.  Pulmonary/Chest: Effort normal. He has wheezes.  Faint expiratory wheezing noted throughout; occasional dry cough noted  Lymphadenopathy:    He has no cervical adenopathy.  Neurological: He is alert and oriented to person, place, and time.  Skin: Skin is warm and dry.  Vitals reviewed.    UC Treatments / Results  Labs (all labs ordered are listed, but only abnormal results are displayed) Labs Reviewed -  No data to display  EKG  EKG Interpretation None       Radiology No results found.  Procedures Procedures (including critical care time)  Medications Ordered in UC Medications - No data to display   Initial Impression / Assessment and Plan / UC Course  I have reviewed the triage vital signs and the nursing notes.  Pertinent labs & imaging results that were available during my care of the patient were reviewed by me and considered in my medical decision making (see chart for details).     Asplenia. New wheezing, mild shortness of breath. O2 at 96% and temp 99. Symptoms have recently worsening. Will cover with azithromycin at this time, imaging deferred. Albuterol PRN. Encouraged close follow up with PCP. Return precautions provided. Patient verbalized understanding and agreeable to plan.  Ambulatory out of clinic without difficulty.    Final Clinical Impressions(s) / UC Diagnoses   Final diagnoses:  Lower respiratory infection    ED Discharge Orders        Ordered    azithromycin (ZITHROMAX) 250 MG tablet     06/08/17 2202    albuterol (PROVENTIL HFA;VENTOLIN HFA) 108 (90 Base) MCG/ACT inhaler  Every 6 hours PRN     06/08/17 2202       Controlled Substance Prescriptions [place] Controlled Substance Registry consulted? n/a   Zigmund Gottron, NP 06/08/17 2223

## 2017-06-08 NOTE — Discharge Instructions (Signed)
Push fluids to ensure adequate hydration and keep secretions thin.  Tylenol and/or ibuprofen as needed for pain or fevers.  Continue with over the counter treatments as needed for symptoms Albuterol inhaler as needed for wheezing or shortness of breath. Please follow up with your primary care doctor for recheck in the next week. Return or go to er if worsening of symptoms, fevers, or shortness of breath.

## 2017-06-08 NOTE — ED Triage Notes (Signed)
Pt sts URI sx x 1 week 

## 2017-06-09 ENCOUNTER — Encounter: Payer: Self-pay | Admitting: Gastroenterology

## 2017-06-09 ENCOUNTER — Ambulatory Visit (AMBULATORY_SURGERY_CENTER): Payer: Self-pay

## 2017-06-09 ENCOUNTER — Other Ambulatory Visit: Payer: Self-pay

## 2017-06-09 VITALS — Ht 69.0 in | Wt 250.0 lb

## 2017-06-09 DIAGNOSIS — Z1211 Encounter for screening for malignant neoplasm of colon: Secondary | ICD-10-CM

## 2017-06-09 MED ORDER — NA SULFATE-K SULFATE-MG SULF 17.5-3.13-1.6 GM/177ML PO SOLN: 1.0000 | Freq: Once | ORAL | 0 refills | Status: AC

## 2017-06-09 NOTE — Progress Notes (Signed)
Denies allergies to eggs or soy products. Denies complication of anesthesia or sedation. Denies use of weight loss medication. Denies use of O2.   Emmi instructions declined.  

## 2017-06-16 ENCOUNTER — Telehealth: Payer: Self-pay | Admitting: Gastroenterology

## 2017-06-16 DIAGNOSIS — Z1211 Encounter for screening for malignant neoplasm of colon: Secondary | ICD-10-CM

## 2017-06-16 MED ORDER — PEG 3350-KCL-NA BICARB-NACL 420 G PO SOLR: 4000.0000 mL | Freq: Once | ORAL | 0 refills | Status: AC

## 2017-06-16 NOTE — Telephone Encounter (Signed)
Spoke with patient. Suprep $48. Pt unable to pay that. Golytely sent to his pharmacy and he will come pick new instructions before 5 pm 4 th floor.

## 2017-06-17 NOTE — Telephone Encounter (Signed)
Suprep sample rx and instructions reviewed with pt

## 2017-06-19 ENCOUNTER — Ambulatory Visit: Payer: Medicare Other

## 2017-06-19 ENCOUNTER — Ambulatory Visit (INDEPENDENT_AMBULATORY_CARE_PROVIDER_SITE_OTHER): Payer: Medicare Other

## 2017-06-19 DIAGNOSIS — E538 Deficiency of other specified B group vitamins: Secondary | ICD-10-CM | POA: Diagnosis not present

## 2017-06-19 MED ORDER — CYANOCOBALAMIN 1000 MCG/ML IJ SOLN
1000.0000 ug | Freq: Once | INTRAMUSCULAR | Status: AC
  Administered 2017-06-19: 1000 ug via INTRAMUSCULAR

## 2017-06-23 ENCOUNTER — Ambulatory Visit (AMBULATORY_SURGERY_CENTER): Payer: Medicare Other | Admitting: Gastroenterology

## 2017-06-23 ENCOUNTER — Encounter: Payer: Self-pay | Admitting: Gastroenterology

## 2017-06-23 VITALS — BP 139/74 | HR 60 | Temp 98.7°F | Resp 15 | Ht 68.0 in | Wt 244.0 lb

## 2017-06-23 DIAGNOSIS — K635 Polyp of colon: Secondary | ICD-10-CM

## 2017-06-23 DIAGNOSIS — Z1211 Encounter for screening for malignant neoplasm of colon: Secondary | ICD-10-CM

## 2017-06-23 DIAGNOSIS — D128 Benign neoplasm of rectum: Secondary | ICD-10-CM

## 2017-06-23 DIAGNOSIS — E119 Type 2 diabetes mellitus without complications: Secondary | ICD-10-CM | POA: Diagnosis not present

## 2017-06-23 DIAGNOSIS — K529 Noninfective gastroenteritis and colitis, unspecified: Secondary | ICD-10-CM

## 2017-06-23 DIAGNOSIS — K5190 Ulcerative colitis, unspecified, without complications: Secondary | ICD-10-CM | POA: Diagnosis not present

## 2017-06-23 DIAGNOSIS — D122 Benign neoplasm of ascending colon: Secondary | ICD-10-CM

## 2017-06-23 DIAGNOSIS — K621 Rectal polyp: Secondary | ICD-10-CM | POA: Diagnosis not present

## 2017-06-23 MED ORDER — SODIUM CHLORIDE 0.9 % IV SOLN: 500.0000 mL | Freq: Once | INTRAVENOUS | Status: DC

## 2017-06-23 NOTE — Patient Instructions (Signed)
YOU HAD AN ENDOSCOPIC PROCEDURE TODAY AT THE  ENDOSCOPY CENTER:   Refer to the procedure report that was given to you for any specific questions about what was found during the examination.  If the procedure report does not answer your questions, please call your gastroenterologist to clarify.  If you requested that your care partner not be given the details of your procedure findings, then the procedure report has been included in a sealed envelope for you to review at your convenience later.  YOU SHOULD EXPECT: Some feelings of bloating in the abdomen. Passage of more gas than usual.  Walking can help get rid of the air that was put into your GI tract during the procedure and reduce the bloating. If you had a lower endoscopy (such as a colonoscopy or flexible sigmoidoscopy) you may notice spotting of blood in your stool or on the toilet paper. If you underwent a bowel prep for your procedure, you may not have a normal bowel movement for a few days.  Please Note:  You might notice some irritation and congestion in your nose or some drainage.  This is from the oxygen used during your procedure.  There is no need for concern and it should clear up in a day or so.  SYMPTOMS TO REPORT IMMEDIATELY:   Following lower endoscopy (colonoscopy or flexible sigmoidoscopy):  Excessive amounts of blood in the stool  Significant tenderness or worsening of abdominal pains  Swelling of the abdomen that is new, acute  Fever of 100F or higher  For urgent or emergent issues, a gastroenterologist can be reached at any hour by calling [phone].   DIET:  We do recommend a small meal at first, but then you may proceed to your regular diet.  Drink plenty of fluids but you should avoid alcoholic beverages for 24 hours.  MEDICATIONS: Continue present medications.  Please see handouts given to you by your recovery nurse.  ACTIVITY:  You should plan to take it easy for the rest of today and you should NOT  DRIVE or use heavy machinery until tomorrow (because of the sedation medicines used during the test).    FOLLOW UP: Our staff will call the number listed on your records the next business day following your procedure to check on you and address any questions or concerns that you may have regarding the information given to you following your procedure. If we do not reach you, we will leave a message.  However, if you are feeling well and you are not experiencing any problems, there is no need to return our call.  We will assume that you have returned to your regular daily activities without incident.  If any biopsies were taken you will be contacted by phone or by letter within the next 1-3 weeks.  Please call us at [phone] if you have not heard about the biopsies in 3 weeks.   Thank you for allowing us to provide for your healthcare needs today.   SIGNATURES/CONFIDENTIALITY: You and/or your care partner have signed paperwork which will be entered into your electronic medical record.  These signatures attest to the fact that that the information above on your After Visit Summary has been reviewed and is understood.  Full responsibility of the confidentiality of this discharge information lies with you and/or your care-partner. 

## 2017-06-23 NOTE — Progress Notes (Signed)
Called to room to assist during endoscopic procedure.  Patient ID and intended procedure confirmed with present staff. Received instructions for my participation in the procedure from the performing physician.  

## 2017-06-23 NOTE — Progress Notes (Signed)
Pt's states no medical or surgical changes since previsit or office visit. 

## 2017-06-23 NOTE — Progress Notes (Signed)
Report to PACU, RN, vss, BBS= Clear.  

## 2017-06-23 NOTE — Op Note (Signed)
[name] Patient Name: Kenneth [name] Procedure Date: 06/23/2017 8:17 AM MRN: [id number] Endoscopist: Ladene Artist , MD Age: 62 Referring MD:  Date of Birth: 07-Feb-1956 Gender: Male Account #: [id number] Procedure:                Colonoscopy Indications:              Screening for colorectal malignant neoplasm Medicines:                Monitored Anesthesia Care Procedure:                Pre-Anesthesia Assessment:                           - Prior to the procedure, a History and Physical                            was performed, and patient medications and                            allergies were reviewed. The patient's tolerance of                            previous anesthesia was also reviewed. The risks                            and benefits of the procedure and the sedation                            options and risks were discussed with the patient.                            All questions were answered, and informed consent                            was obtained. Prior Anticoagulants: The patient has                            taken no previous anticoagulant or antiplatelet                            agents. ASA Grade Assessment: III - A patient with                            severe systemic disease. After reviewing the risks                            and benefits, the patient was deemed in                            satisfactory condition to undergo the procedure.                           After obtaining informed consent, the colonoscope  was passed under direct vision. Throughout the                            procedure, the patient's blood pressure, pulse, and                            oxygen saturations were monitored continuously. The                            Colonoscope was introduced through the anus and                            advanced to the the cecum, identified by                            appendiceal orifice and  ileocecal valve. The                            ileocecal valve, appendiceal orifice, and rectum                            were photographed. The quality of the bowel                            preparation was good. The colonoscopy was performed                            without difficulty. The patient tolerated the                            procedure well. Scope In: 8:23:12 AM Scope Out: 0:25:42 AM Scope Withdrawal Time: 0 hours 11 minutes 51 seconds  Total Procedure Duration: 0 hours 13 minutes 44 seconds  Findings:                 The perianal and digital rectal examinations were                            normal.                           Two sessile polyps were found in the rectum and                            ascending colon. The polyps were 4 to 5 mm in size.                            These polyps were removed with a cold biopsy                            forceps. Resection and retrieval were complete.                           A few patchy non-bleeding erosions were found in  the transverse colon, in the cecum and at the                            ileocecal valve. No stigmata of recent bleeding                            were seen. Biopsies were taken with a cold forceps                            for histology.                           Internal hemorrhoids were found during                            retroflexion. The hemorrhoids were small and Grade                            I (internal hemorrhoids that do not prolapse).                           The exam was otherwise without abnormality on                            direct and retroflexion views. Complications:            No immediate complications. Estimated blood loss:                            None. Estimated Blood Loss:     Estimated blood loss: none. Impression:               - Two 4 to 5 mm polyps in the rectum and in the                            ascending colon, removed with a cold  biopsy                            forceps. Resected and retrieved.                           - A few erosions in the transverse colon, in the                            cecum and at the ileocecal valve. Biopsied.                           - Internal hemorrhoids.                           - The examination was otherwise normal on direct                            and retroflexion views. Recommendation:           - Repeat colonoscopy in  5 years for surveillance if                            polyp(s) are precancerous otherwise 10 years.                           - Patient has a contact number available for                            emergencies. The signs and symptoms of potential                            delayed complications were discussed with the                            patient. Return to normal activities tomorrow.                            Written discharge instructions were provided to the                            patient.                           - Resume previous diet.                           - Continue present medications.                           - Await pathology results. Ladene Artist, MD 06/23/2017 8:41:23 AM This report has been signed electronically.

## 2017-06-24 ENCOUNTER — Ambulatory Visit (HOSPITAL_COMMUNITY)
Admission: EM | Admit: 2017-06-24 | Discharge: 2017-06-24 | Disposition: A | Discharge status: Home / Self Care | Payer: Medicare Other | Attending: Family Medicine | Admitting: Family Medicine

## 2017-06-24 ENCOUNTER — Telehealth: Payer: Self-pay | Admitting: *Deleted

## 2017-06-24 ENCOUNTER — Encounter (HOSPITAL_COMMUNITY): Payer: Self-pay | Admitting: Emergency Medicine

## 2017-06-24 DIAGNOSIS — B9789 Other viral agents as the cause of diseases classified elsewhere: Secondary | ICD-10-CM

## 2017-06-24 DIAGNOSIS — J069 Acute upper respiratory infection, unspecified: Secondary | ICD-10-CM | POA: Diagnosis not present

## 2017-06-24 MED ORDER — IPRATROPIUM BROMIDE 0.06 % NA SOLN: 2.0000 | Freq: Four times a day (QID) | NASAL | 0 refills | Status: DC

## 2017-06-24 MED ORDER — FLUTICASONE PROPIONATE 50 MCG/ACT NA SUSP: 2.0000 | Freq: Every day | NASAL | 0 refills | Status: DC

## 2017-06-24 MED ORDER — BENZONATATE 100 MG PO CAPS: 100.0000 mg | ORAL_CAPSULE | Freq: Three times a day (TID) | ORAL | 0 refills | Status: DC

## 2017-06-24 NOTE — ED Triage Notes (Signed)
Pt sts cough x 3 days

## 2017-06-24 NOTE — ED Provider Notes (Signed)
[place]    CSN: [id number] Arrival date & time: 06/24/17  1954     History   Chief Complaint Chief Complaint  Patient presents with  . Cough    HPI Kenneth [name] is a 62 y.o. male.   62 year old male comes in for 3-day history of URI symptoms.  Has had mild productive cough, rhinorrhea, nasal congestion, throat irritation.  Denies shortness of breath, chest pain, wheezing.  Denies fever, chills, night sweats.  He has been using OTC medication with some relief.  Former smoker, 20-pack-year history. States was given an inhaler last time he was sick, but has not needed to use it.      Past Medical History:  Diagnosis Date  . Anxiety and depression   . Bipolar 1 disorder ([place])   . Blood transfusion without reported diagnosis   . Chronic kidney disease   . Depression   . GERD (gastroesophageal reflux disease)   . Hypercholesterolemia   . Hypertension   . Sleep apnea   . Type 2 diabetes mellitus [facility])     Patient Active Problem List   Diagnosis Date Noted  . Tobacco abuse 05/05/2017  . Preventative health care 04/24/2017  . Numbness and tingling 04/15/2017  . OSA (obstructive sleep apnea) 02/07/2017  . Snoring 10/22/2016  . Nausea 09/10/2016  . Epigastric abdominal tenderness without rebound tenderness 09/10/2016  . GERD without esophagitis 09/10/2016  . Nonspecific abnormal electrocardiogram (ECG) (EKG) 09/10/2016  . Lower extremity edema 08/12/2016  . Type 2 diabetes mellitus without complication, without long-term current use of insulin ([place]) 06/26/2016  . Anxiety and depression 06/26/2016  . Hypertension 06/26/2016  . Hyperlipidemia 06/26/2016  . Chronic neck pain 06/26/2016    Past Surgical History:  Procedure Laterality Date  . L hand surgery    . R knee arthoroscopy    . SPLENECTOMY         Home Medications    Prior to Admission medications   Medication Sig Start Date End Date Taking? Authorizing Provider  acetaminophen (TYLENOL)  500 MG tablet Take 500 mg by mouth every 6 (six) hours as needed (headache).    [provider]  albuterol (PROVENTIL HFA;VENTOLIN HFA) 108 (90 Base) MCG/ACT inhaler Inhale 1-2 puffs into the lungs every 6 (six) hours as needed for wheezing or shortness of breath. Patient not taking: Reported on 06/09/2017 06/08/17   Augusto Gamble B, NP  Ascorbic Acid (VITAMIN C) 1000 MG tablet Take 1,000 mg by mouth daily.    [provider]  atenolol (TENORMIN) 50 MG tablet Take 1 tablet (50 mg total) daily by mouth. 02/06/17   Pleas Koch, NP  atorvastatin (LIPITOR) 20 MG tablet Take 1 tablet (20 mg total) by mouth every evening. 04/24/17   Pleas Koch, NP  benzonatate (TESSALON) 100 MG capsule Take 1 capsule (100 mg total) by mouth every 8 (eight) hours. 06/24/17   Ok Edwards, PA-C  diclofenac (VOLTAREN) 75 MG EC tablet  04/11/17   [provider]  fluticasone (FLONASE) 50 MCG/ACT nasal spray Place 2 sprays into both nostrils daily. 06/24/17   Tasia Catchings, Amy V, PA-C  GARLIC PO Take by mouth.    [provider]  Ginseng (GIN-ZING PO) Take by mouth.    [provider]  hydrochlorothiazide (HYDRODIURIL) 25 MG tablet Take 1 tablet (25 mg total) daily by mouth. 02/06/17   Pleas Koch, NP  ipratropium (ATROVENT) 0.06 % nasal spray Place 2 sprays into both nostrils 4 (  four) times daily. 06/24/17   Tasia Catchings, Amy V, PA-C  lisinopril (PRINIVIL,ZESTRIL) 40 MG tablet Take 1 tablet (40 mg total) by mouth daily. 04/24/17   Pleas Koch, NP  metFORMIN (GLUCOPHAGE) 500 MG tablet Take 1 tablet (500 mg total) by mouth 2 (two) times daily with a meal. Patient not taking: Reported on 05/08/2017 04/24/17   Pleas Koch, NP  methocarbamol (ROBAXIN) 500 MG tablet Take 500 mg by mouth daily as needed.  04/11/17   [provider]  Multiple Vitamins-Minerals (MULTIVITAMIN ADULT) TABS Take 1 tablet by mouth daily.    [provider]  Omega-3 Fatty Acids (OMEGA 3 PO)  Take 1 capsule by mouth daily.    [provider]  sertraline (ZOLOFT) 50 MG tablet Take 1 tablet (50 mg total) daily by mouth. 05/14/17   Pleas Koch, NP  VITAMIN E PO Take 1 tablet by mouth daily.    [provider]    Family History Family History  Problem Relation Age of Onset  . Hypertension Mother   . Mental illness Mother   . Diabetes Mother   . Hypertension Father   . Lung cancer Father   . Diabetes Paternal Grandmother   . Colon cancer Maternal Uncle   . Esophageal cancer Neg Hx   . Liver cancer Neg Hx   . Pancreatic cancer Neg Hx   . Rectal cancer Neg Hx   . Stomach cancer Neg Hx     Social History Social History   Tobacco Use  . Smoking status: Former Smoker    Packs/day: 0.50    Years: 41.00    Pack years: 20.50    Types: Cigarettes  . Smokeless tobacco: Never Used  . Tobacco comment: Quit in Feb.   Substance Use Topics  . Alcohol use: No  . Drug use: No     Allergies   Patient has no known allergies.   Review of Systems Review of Systems  Reason unable to perform ROS: See HPI as above.     Physical Exam Triage Vital Signs ED Triage Vitals [06/24/17 2031]  Enc Vitals Group     BP (!) 168/87     Pulse Rate 94     Resp 18     Temp 99.2 F (37.3 C)     Temp Source Oral     SpO2 95 %     Weight      Height      Head Circumference      Peak Flow      Pain Score      Pain Loc      Pain Edu?      Excl. in [place]?    No data found.  Updated Vital Signs BP (!) 168/87 (BP Location: Right Arm)   Pulse 94   Temp 99.2 F (37.3 C) (Oral)   Resp 18   SpO2 95%   Physical Exam  Constitutional: He is oriented to person, place, and time. He appears well-developed and well-nourished. No distress.  HENT:  Head: Normocephalic and atraumatic.  Right Ear: Tympanic membrane, external ear and ear canal normal. Tympanic membrane is not erythematous and not bulging.  Left Ear: Tympanic membrane, external ear and ear canal normal.  Tympanic membrane is not erythematous and not bulging.  Nose: Mucosal edema and rhinorrhea present. Right sinus exhibits no maxillary sinus tenderness and no frontal sinus tenderness. Left sinus exhibits no maxillary sinus tenderness and no frontal sinus tenderness.  Mouth/Throat: Uvula  is midline, oropharynx is clear and moist and mucous membranes are normal.  Eyes: Pupils are equal, round, and reactive to light. Conjunctivae are normal.  Neck: Normal range of motion. Neck supple.  Cardiovascular: Normal rate, regular rhythm and normal heart sounds. Exam reveals no gallop and no friction rub.  No murmur heard. Pulmonary/Chest: Effort normal and breath sounds normal. He has no decreased breath sounds. He has no wheezes. He has no rhonchi. He has no rales.  Lymphadenopathy:    He has no cervical adenopathy.  Neurological: He is alert and oriented to person, place, and time.  Skin: Skin is warm and dry.  Psychiatric: He has a normal mood and affect. His behavior is normal. Judgment normal.     UC Treatments / Results  Labs (all labs ordered are listed, but only abnormal results are displayed) Labs Reviewed - No data to display  EKG None Radiology No results found.  Procedures Procedures (including critical care time)  Medications Ordered in UC Medications - No data to display   Initial Impression / Assessment and Plan / UC Course  I have reviewed the triage vital signs and the nursing notes.  Pertinent labs & imaging results that were available during my care of the patient were reviewed by me and considered in my medical decision making (see chart for details).    Discussed with patient history and exam most consistent with viral URI. Symptomatic treatment as needed. Push fluids. Return precautions given.   Final Clinical Impressions(s) / UC Diagnoses   Final diagnoses:  Viral URI with cough    ED Discharge Orders        Ordered    fluticasone (FLONASE) 50 MCG/ACT  nasal spray  Daily     06/24/17 2048    ipratropium (ATROVENT) 0.06 % nasal spray  4 times daily     06/24/17 2048    benzonatate (TESSALON) 100 MG capsule  Every 8 hours     06/24/17 2048        Ok Edwards, PA-C 06/24/17 2102

## 2017-06-24 NOTE — Discharge Instructions (Signed)
Tessalon for cough. Start flonase, atrovent nasal spray for nasal congestion/drainage. You can use over the counter nasal saline rinse such as neti pot for nasal congestion. Keep hydrated, your urine should be clear to pale yellow in color. Tylenol/motrin for fever and pain. Monitor for any worsening of symptoms, chest pain, shortness of breath, wheezing, swelling of the throat, follow up for reevaluation.   For sore throat try using a honey-based tea. Use 3 teaspoons of honey with juice squeezed from half lemon. Place shaved pieces of ginger into 1/2-1 cup of water and warm over stove top. Then mix the ingredients and repeat every 4 hours as needed.

## 2017-06-24 NOTE — Telephone Encounter (Signed)
  Follow up Call-  Call back number 06/23/2017  Post procedure Call Back phone  # [phone]  Permission to leave phone message Yes  Some recent data might be hidden     Patient questions:  Do you have a fever, pain , or abdominal swelling? No. Pain Score  0 *  Have you tolerated food without any problems? Yes.    Have you been able to return to your normal activities? Yes.    Do you have any questions about your discharge instructions: Diet   No. Medications  No. Follow up visit  No.  Do you have questions or concerns about your Care? No.  Actions: * If pain score is 4 or above: No action needed, pain <4.

## 2017-07-02 ENCOUNTER — Encounter: Payer: Self-pay | Admitting: Gastroenterology

## 2017-07-07 DIAGNOSIS — G4733 Obstructive sleep apnea (adult) (pediatric): Secondary | ICD-10-CM | POA: Diagnosis not present

## 2017-07-19 ENCOUNTER — Other Ambulatory Visit: Payer: Self-pay | Admitting: Primary Care

## 2017-07-19 DIAGNOSIS — I10 Essential (primary) hypertension: Secondary | ICD-10-CM

## 2017-07-19 DIAGNOSIS — E782 Mixed hyperlipidemia: Secondary | ICD-10-CM

## 2017-07-19 DIAGNOSIS — E119 Type 2 diabetes mellitus without complications: Secondary | ICD-10-CM

## 2017-07-19 DIAGNOSIS — E538 Deficiency of other specified B group vitamins: Secondary | ICD-10-CM

## 2017-07-23 ENCOUNTER — Ambulatory Visit: Payer: Medicare Other

## 2017-07-24 ENCOUNTER — Other Ambulatory Visit (INDEPENDENT_AMBULATORY_CARE_PROVIDER_SITE_OTHER): Payer: Medicare Other

## 2017-07-24 ENCOUNTER — Ambulatory Visit (INDEPENDENT_AMBULATORY_CARE_PROVIDER_SITE_OTHER): Payer: Medicare Other

## 2017-07-24 DIAGNOSIS — E538 Deficiency of other specified B group vitamins: Secondary | ICD-10-CM | POA: Diagnosis not present

## 2017-07-24 DIAGNOSIS — E119 Type 2 diabetes mellitus without complications: Secondary | ICD-10-CM

## 2017-07-24 DIAGNOSIS — E782 Mixed hyperlipidemia: Secondary | ICD-10-CM | POA: Diagnosis not present

## 2017-07-24 LAB — LIPID PANEL
Cholesterol: 120 mg/dL (ref 0–200)
HDL: 29.1 mg/dL — ABNORMAL LOW (ref >39.00)
LDL Cholesterol: 56 mg/dL (ref 0–99)
NonHDL: 91.04
Total CHOL/HDL Ratio: 4
Triglycerides: 174 mg/dL — ABNORMAL HIGH (ref 0.0–149.0)
VLDL: 34.8 mg/dL (ref 0.0–40.0)

## 2017-07-24 LAB — VITAMIN B12: Vitamin B-12: 154 pg/mL — ABNORMAL LOW (ref 211–911)

## 2017-07-24 LAB — POCT GLYCOSYLATED HEMOGLOBIN (HGB A1C): Hemoglobin A1C: 8.1

## 2017-07-24 MED ORDER — CYANOCOBALAMIN 1000 MCG/ML IJ SOLN
1000.0000 ug | Freq: Once | INTRAMUSCULAR | Status: AC
  Administered 2017-07-24: 1000 ug via INTRAMUSCULAR

## 2017-07-25 ENCOUNTER — Other Ambulatory Visit: Payer: Medicare Other

## 2017-07-28 ENCOUNTER — Encounter: Payer: Self-pay | Admitting: Primary Care

## 2017-07-28 ENCOUNTER — Ambulatory Visit (INDEPENDENT_AMBULATORY_CARE_PROVIDER_SITE_OTHER): Payer: Medicare Other | Admitting: Primary Care

## 2017-07-28 DIAGNOSIS — I10 Essential (primary) hypertension: Secondary | ICD-10-CM | POA: Diagnosis not present

## 2017-07-28 DIAGNOSIS — E538 Deficiency of other specified B group vitamins: Secondary | ICD-10-CM | POA: Insufficient documentation

## 2017-07-28 DIAGNOSIS — E119 Type 2 diabetes mellitus without complications: Secondary | ICD-10-CM

## 2017-07-28 NOTE — Patient Instructions (Addendum)
Schedule a lab only appointment in 1 month to return for your diabetes check.  Start exercising. You should be getting 150 minutes of moderate intensity exercise weekly.  It is important that you improve your diet. Please limit carbohydrates in the form of white bread, rice, pasta, sweets, fast food, fried food, sugary drinks, etc. Increase your consumption of fresh fruits and vegetables, whole grains, lean protein.  Ensure you are consuming 64 ounces of water daily.  Please schedule a follow up appointment in 6 months.   It was a pleasure to see you today!   Diabetes Mellitus and Nutrition When you have diabetes (diabetes mellitus), it is very important to have healthy eating habits because your blood sugar (glucose) levels are greatly affected by what you eat and drink. Eating healthy foods in the appropriate amounts, at about the same times every day, can help you:  Control your blood glucose.  Lower your risk of heart disease.  Improve your blood pressure.  Reach or maintain a healthy weight.  Every person with diabetes is different, and each person has different needs for a meal plan. Your health care provider may recommend that you work with a diet and nutrition specialist (dietitian) to make a meal plan that is best for you. Your meal plan may vary depending on factors such as:  The calories you need.  The medicines you take.  Your weight.  Your blood glucose, blood pressure, and cholesterol levels.  Your activity level.  Other health conditions you have, such as heart or kidney disease.  How do carbohydrates affect me? Carbohydrates affect your blood glucose level more than any other type of food. Eating carbohydrates naturally increases the amount of glucose in your blood. Carbohydrate counting is a method for keeping track of how many carbohydrates you eat. Counting carbohydrates is important to keep your blood glucose at a healthy level, especially if you use insulin  or take certain oral diabetes medicines. It is important to know how many carbohydrates you can safely have in each meal. This is different for every person. Your dietitian can help you calculate how many carbohydrates you should have at each meal and for snack. Foods that contain carbohydrates include:  Bread, cereal, rice, pasta, and crackers.  Potatoes and corn.  Peas, beans, and lentils.  Milk and yogurt.  Fruit and juice.  Desserts, such as cakes, cookies, ice cream, and candy.  How does alcohol affect me? Alcohol can cause a sudden decrease in blood glucose (hypoglycemia), especially if you use insulin or take certain oral diabetes medicines. Hypoglycemia can be a life-threatening condition. Symptoms of hypoglycemia (sleepiness, dizziness, and confusion) are similar to symptoms of having too much alcohol. If your health care provider says that alcohol is safe for you, follow these guidelines:  Limit alcohol intake to no more than 1 drink per day for nonpregnant women and 2 drinks per day for men. One drink equals 12 oz of beer, 5 oz of wine, or 1 oz of hard liquor.  Do not drink on an empty stomach.  Keep yourself hydrated with water, diet soda, or unsweetened iced tea.  Keep in mind that regular soda, juice, and other mixers may contain a lot of sugar and must be counted as carbohydrates.  What are tips for following this plan? Reading food labels  Start by checking the serving size on the label. The amount of calories, carbohydrates, fats, and other nutrients listed on the label are based on one serving of the  food. Many foods contain more than one serving per package.  Check the total grams (g) of carbohydrates in one serving. You can calculate the number of servings of carbohydrates in one serving by dividing the total carbohydrates by 15. For example, if a food has 30 g of total carbohydrates, it would be equal to 2 servings of carbohydrates.  Check the number of grams  (g) of saturated and trans fats in one serving. Choose foods that have low or no amount of these fats.  Check the number of milligrams (mg) of sodium in one serving. Most people should limit total sodium intake to less than 2,300 mg per day.  Always check the nutrition information of foods labeled as "low-fat" or "nonfat". These foods may be higher in added sugar or refined carbohydrates and should be avoided.  Talk to your dietitian to identify your daily goals for nutrients listed on the label. Shopping  Avoid buying canned, premade, or processed foods. These foods tend to be high in fat, sodium, and added sugar.  Shop around the outside edge of the grocery store. This includes fresh fruits and vegetables, bulk grains, fresh meats, and fresh dairy. Cooking  Use low-heat cooking methods, such as baking, instead of high-heat cooking methods like deep frying.  Cook using healthy oils, such as olive, canola, or sunflower oil.  Avoid cooking with butter, cream, or high-fat meats. Meal planning  Eat meals and snacks regularly, preferably at the same times every day. Avoid going long periods of time without eating.  Eat foods high in fiber, such as fresh fruits, vegetables, beans, and whole grains. Talk to your dietitian about how many servings of carbohydrates you can eat at each meal.  Eat 4-6 ounces of lean protein each day, such as lean meat, chicken, fish, eggs, or tofu. 1 ounce is equal to 1 ounce of meat, chicken, or fish, 1 egg, or 1/4 cup of tofu.  Eat some foods each day that contain healthy fats, such as avocado, nuts, seeds, and fish. Lifestyle   Check your blood glucose regularly.  Exercise at least 30 minutes 5 or more days each week, or as told by your health care provider.  Take medicines as told by your health care provider.  Do not use any products that contain nicotine or tobacco, such as cigarettes and e-cigarettes. If you need help quitting, ask your health care  provider.  Work with a Social worker or diabetes educator to identify strategies to manage stress and any emotional and social challenges. What are some questions to ask my health care provider?  Do I need to meet with a diabetes educator?  Do I need to meet with a dietitian?  What number can I call if I have questions?  When are the best times to check my blood glucose? Where to find more information:  American Diabetes Association: diabetes.org/food-and-fitness/food  Academy of Nutrition and Dietetics: PokerClues.dk  [employer] of Diabetes and Digestive and Kidney Diseases (NIH): ContactWire.be Summary  A healthy meal plan will help you control your blood glucose and maintain a healthy lifestyle.  Working with a diet and nutrition specialist (dietitian) can help you make a meal plan that is best for you.  Keep in mind that carbohydrates and alcohol have immediate effects on your blood glucose levels. It is important to count carbohydrates and to use alcohol carefully. This information is not intended to replace advice given to you by your health care provider. Make sure you discuss any questions you  with your health care provider. Document Released: 12/13/2004 Document Revised: 04/22/2016 Document Reviewed: 04/22/2016 Elsevier Interactive Patient Education  2018 Elsevier Inc.   

## 2017-07-28 NOTE — Assessment & Plan Note (Signed)
Noted on recent labs after complaints of paraesthesias. Continue monthly B 12 injections, recent level still low.

## 2017-07-28 NOTE — Assessment & Plan Note (Signed)
Stable in the office today, continue lisinopril 40 mg and HCTZ 25 mg. BMP stable.

## 2017-07-28 NOTE — Progress Notes (Signed)
Subjective:    Patient ID: Kenneth [name], male    DOB: 07/14/1955, 62 y.o.   MRN: [id number]  HPI  Mr. Southers is a 62 year old male who presents today for follow up.  1) Vitamin B 12 Deficiency: Currently managed on vitamin B 12 injections. He missed his last two months of injections. Recent level of 154.  2) Type 2 Diabetes:   Current medications include: Metformin 500 mg BID. He lost his medication bottle of Metformin and resumed them two weeks ago.   Last A1C: 6.6 in January 2019 Last Eye Exam: Completed in December 2018 Last Foot Exam: Due today Pneumonia Vaccination: Endorses compliance.  ACE/ARB: Lisinopril  Statin: Atorvastatin   Diet currently consists of:  Breakfast: Cereal, oatmeal, grits, fast food Lunch: Peanut butter with crackers, chips Dinner: Restaurants, pasta, some vegetables, soup Snacks: Chips, crackers Desserts: Doughnuts, fruit turnovers; 4 times weekly  Beverages: Soda, water, sweet tea  Exercise: He is active, not exercising.        Review of Systems  Constitutional: Negative for fatigue.  Respiratory: Negative for shortness of breath.   Cardiovascular: Negative for chest pain.  Endocrine: Negative for polyuria.  Neurological: Negative for dizziness and headaches.       Past Medical History:  Diagnosis Date  . Anxiety and depression   . Bipolar 1 disorder ([place])   . Blood transfusion without reported diagnosis   . Chronic kidney disease   . Depression   . GERD (gastroesophageal reflux disease)   . Hypercholesterolemia   . Hypertension   . Sleep apnea   . Type 2 diabetes mellitus ([place])      Social History   Socioeconomic History  . Marital status: Divorced    Spouse name: Not on file  . Number of children: Not on file  . Years of education: Not on file  . Highest education level: Not on file  Occupational History  . Not on file  Social Needs  . Financial resource strain: Not on file  . Food insecurity:    Worry: Not on  file    Inability: Not on file  . Transportation needs:    Medical: Not on file    Non-medical: Not on file  Tobacco Use  . Smoking status: Former Smoker    Packs/day: 0.50    Years: 41.00    Pack years: 20.50    Types: Cigarettes  . Smokeless tobacco: Never Used  . Tobacco comment: Quit in Feb.   Substance and Sexual Activity  . Alcohol use: No  . Drug use: No  . Sexual activity: Not on file  Lifestyle  . Physical activity:    Days per week: Not on file    Minutes per session: Not on file  . Stress: Not on file  Relationships  . Social connections:    Talks on phone: Not on file    Gets together: Not on file    Attends religious service: Not on file    Active member of club or organization: Not on file    Attends meetings of clubs or organizations: Not on file    Relationship status: Not on file  . Intimate partner violence:    Fear of current or ex partner: Not on file    Emotionally abused: Not on file    Physically abused: Not on file    Forced sexual activity: Not on file  Other Topics Concern  . Not on file  Social History Narrative  Single.   1 daughter, 1 grandchild.   Retired. Once worked in [employer].   Enjoys sports, racing, traveling.     Past Surgical History:  Procedure Laterality Date  . L hand surgery    . R knee arthoroscopy    . SPLENECTOMY      Family History  Problem Relation Age of Onset  . Hypertension Mother   . Mental illness Mother   . Diabetes Mother   . Hypertension Father   . Lung cancer Father   . Diabetes Paternal Grandmother   . Colon cancer Maternal Uncle   . Esophageal cancer Neg Hx   . Liver cancer Neg Hx   . Pancreatic cancer Neg Hx   . Rectal cancer Neg Hx   . Stomach cancer Neg Hx     No Known Allergies  Current Outpatient Medications on File Prior to Visit  Medication Sig Dispense Refill  . acetaminophen (TYLENOL) 500 MG tablet Take 500 mg by mouth every 6 (six) hours as needed (headache).    . Ascorbic Acid  (VITAMIN C) 1000 MG tablet Take 1,000 mg by mouth daily.    Marland Kitchen atenolol (TENORMIN) 50 MG tablet Take 1 tablet (50 mg total) daily by mouth. 90 tablet 1  . atorvastatin (LIPITOR) 20 MG tablet Take 1 tablet (20 mg total) by mouth every evening. 90 tablet 3  . diclofenac (VOLTAREN) 75 MG EC tablet     . fluticasone (FLONASE) 50 MCG/ACT nasal spray Place 2 sprays into both nostrils daily. 1 g 0  . GARLIC PO Take by mouth.    . Ginseng (GIN-ZING PO) Take by mouth.    . hydrochlorothiazide (HYDRODIURIL) 25 MG tablet Take 1 tablet (25 mg total) daily by mouth. 90 tablet 1  . lisinopril (PRINIVIL,ZESTRIL) 40 MG tablet Take 1 tablet (40 mg total) by mouth daily. 90 tablet 3  . metFORMIN (GLUCOPHAGE) 500 MG tablet Take 1 tablet (500 mg total) by mouth 2 (two) times daily with a meal. 180 tablet 3  . methocarbamol (ROBAXIN) 500 MG tablet Take 500 mg by mouth daily as needed.     . Multiple Vitamins-Minerals (MULTIVITAMIN ADULT) TABS Take 1 tablet by mouth daily.    . Omega-3 Fatty Acids (OMEGA 3 PO) Take 1 capsule by mouth daily.    . sertraline (ZOLOFT) 50 MG tablet Take 1 tablet (50 mg total) daily by mouth. 90 tablet 1  . VITAMIN E PO Take 1 tablet by mouth daily.    Marland Kitchen albuterol (PROVENTIL HFA;VENTOLIN HFA) 108 (90 Base) MCG/ACT inhaler Inhale 1-2 puffs into the lungs every 6 (six) hours as needed for wheezing or shortness of breath. (Patient not taking: Reported on 06/09/2017) 1 Inhaler 0   Current Facility-Administered Medications on File Prior to Visit  Medication Dose Route Frequency Provider Last Rate Last Dose  . 0.9 %  sodium chloride infusion  500 mL Intravenous Once Ladene Artist, MD        BP 126/76   Pulse 71   Temp 97.7 F (36.5 C) (Oral)   Ht 5\' 8"  (1.727 m)   Wt 238 lb 8 oz (108.2 kg)   SpO2 95%   BMI 36.26 kg/m    Objective:   Physical Exam  Constitutional: He appears well-nourished.  Cardiovascular: Normal rate and regular rhythm.  Pulmonary/Chest: Effort normal and  breath sounds normal.  Skin: Skin is warm and dry.          Assessment & Plan:

## 2017-07-28 NOTE — Assessment & Plan Note (Signed)
Has not been taking Metformin, resumed two weeks ago. Will repeat A1C in 1 month.   Managed on ACE and statin. Foot exam today. Eye exam UTD. Pneumonia vaccination UTD per patient. Discussed the importance of a healthy diet and regular exercise in order for weight loss, and to reduce the risk of any potential medical problems.

## 2017-08-04 DIAGNOSIS — Z79899 Other long term (current) drug therapy: Secondary | ICD-10-CM | POA: Insufficient documentation

## 2017-08-04 DIAGNOSIS — E119 Type 2 diabetes mellitus without complications: Secondary | ICD-10-CM | POA: Insufficient documentation

## 2017-08-04 DIAGNOSIS — N289 Disorder of kidney and ureter, unspecified: Secondary | ICD-10-CM | POA: Insufficient documentation

## 2017-08-04 DIAGNOSIS — R600 Localized edema: Secondary | ICD-10-CM | POA: Diagnosis not present

## 2017-08-04 DIAGNOSIS — J811 Chronic pulmonary edema: Secondary | ICD-10-CM | POA: Diagnosis not present

## 2017-08-04 DIAGNOSIS — L539 Erythematous condition, unspecified: Secondary | ICD-10-CM | POA: Diagnosis present

## 2017-08-04 DIAGNOSIS — Z7984 Long term (current) use of oral hypoglycemic drugs: Secondary | ICD-10-CM | POA: Insufficient documentation

## 2017-08-04 DIAGNOSIS — I10 Essential (primary) hypertension: Secondary | ICD-10-CM | POA: Insufficient documentation

## 2017-08-04 DIAGNOSIS — Z87891 Personal history of nicotine dependence: Secondary | ICD-10-CM | POA: Insufficient documentation

## 2017-08-04 DIAGNOSIS — M7989 Other specified soft tissue disorders: Secondary | ICD-10-CM | POA: Diagnosis not present

## 2017-08-05 ENCOUNTER — Encounter (HOSPITAL_COMMUNITY): Payer: Self-pay

## 2017-08-05 ENCOUNTER — Emergency Department (HOSPITAL_COMMUNITY)
Admission: EM | Admit: 2017-08-05 | Discharge: 2017-08-05 | Disposition: A | Discharge status: Home / Self Care | Payer: Medicare Other | Attending: Emergency Medicine | Admitting: Emergency Medicine

## 2017-08-05 ENCOUNTER — Emergency Department (HOSPITAL_COMMUNITY): Payer: Medicare Other

## 2017-08-05 ENCOUNTER — Emergency Department (HOSPITAL_BASED_OUTPATIENT_CLINIC_OR_DEPARTMENT_OTHER)
Admit: 2017-08-05 | Discharge: 2017-08-05 | Disposition: A | Discharge status: Home / Self Care | Payer: Medicare Other | Attending: Emergency Medicine | Admitting: Emergency Medicine

## 2017-08-05 DIAGNOSIS — J811 Chronic pulmonary edema: Secondary | ICD-10-CM | POA: Diagnosis not present

## 2017-08-05 DIAGNOSIS — M79609 Pain in unspecified limb: Secondary | ICD-10-CM | POA: Diagnosis not present

## 2017-08-05 DIAGNOSIS — M7989 Other specified soft tissue disorders: Secondary | ICD-10-CM

## 2017-08-05 DIAGNOSIS — R609 Edema, unspecified: Secondary | ICD-10-CM | POA: Diagnosis not present

## 2017-08-05 DIAGNOSIS — R600 Localized edema: Secondary | ICD-10-CM

## 2017-08-05 LAB — COMPREHENSIVE METABOLIC PANEL
ALT: 27 U/L (ref 17–63)
AST: 27 U/L (ref 15–41)
Albumin: 3.7 g/dL (ref 3.5–5.0)
Alkaline Phosphatase: 72 U/L (ref 38–126)
Anion gap: 11 (ref 5–15)
BUN: 15 mg/dL (ref 6–20)
CO2: 24 mmol/L (ref 22–32)
Calcium: 8.9 mg/dL (ref 8.9–10.3)
Chloride: 107 mmol/L (ref 101–111)
Creatinine, Ser: 0.94 mg/dL (ref 0.61–1.24)
GFR calc Af Amer: >60 mL/min (ref >60)
GFR calc non Af Amer: >60 mL/min (ref >60)
Glucose, Bld: 141 mg/dL — ABNORMAL HIGH (ref 65–99)
Potassium: 3.4 mmol/L — ABNORMAL LOW (ref 3.5–5.1)
Sodium: 142 mmol/L (ref 135–145)
Total Bilirubin: 0.3 mg/dL (ref 0.3–1.2)
Total Protein: 6.7 g/dL (ref 6.5–8.1)

## 2017-08-05 LAB — CBC WITH DIFFERENTIAL/PLATELET
Basophils Absolute: 0.1 10*3/uL (ref 0.0–0.1)
Basophils Relative: 1 %
Eosinophils Absolute: 0.8 10*3/uL — ABNORMAL HIGH (ref 0.0–0.7)
Eosinophils Relative: 6 %
HCT: 39.6 % (ref 39.0–52.0)
Hemoglobin: 13.3 g/dL (ref 13.0–17.0)
Lymphocytes Relative: 43 %
Lymphs Abs: 5.4 10*3/uL — ABNORMAL HIGH (ref 0.7–4.0)
MCH: 33.1 pg (ref 26.0–34.0)
MCHC: 33.6 g/dL (ref 30.0–36.0)
MCV: 98.5 fL (ref 78.0–100.0)
Monocytes Absolute: 1.7 10*3/uL — ABNORMAL HIGH (ref 0.1–1.0)
Monocytes Relative: 14 %
Neutro Abs: 4.4 10*3/uL (ref 1.7–7.7)
Neutrophils Relative %: 36 %
Platelets: 444 10*3/uL — ABNORMAL HIGH (ref 150–400)
RBC: 4.02 MIL/uL — ABNORMAL LOW (ref 4.22–5.81)
RDW: 13.3 % (ref 11.5–15.5)
WBC: 12.3 10*3/uL — ABNORMAL HIGH (ref 4.0–10.5)

## 2017-08-05 MED ORDER — CEPHALEXIN 500 MG PO CAPS: 500.0000 mg | ORAL_CAPSULE | Freq: Four times a day (QID) | ORAL | 0 refills | Status: DC

## 2017-08-05 NOTE — ED Notes (Signed)
Pt complains of bilateral lower leg redness, some tightness and soreness, denies warmth

## 2017-08-05 NOTE — ED Provider Notes (Signed)
[facility] DEPT Provider Note   CSN: [id number] Arrival date & time: 08/04/17  2354     History   Chief Complaint Chief Complaint  Patient presents with  . Leg Swelling    HPI Kenneth [name] is a 62 y.o. male.  HPI  Kenneth [name] is a 62 y.o. male, with a history of HTN, bipolar, DM, and GERD., presenting to the ED with bilateral lower extremity edema and pain, worse in the last 2 weeks.   Has had bilateral lower extremity edema and erythema for the last 20 years, however, for the past 2 weeks has had increased swelling and pain, beginning and worse on the left.  Pain is described as a soreness, worse with ambulation, 9/10, radiating proximally.  States he has been evaluated by his PCP for this issue, but states, "she did not do anything." Denies shortness of breath, cough, fever/chills, neuro deficits, chest pain, orthopnea, or any other complaints.    Past Medical History:  Diagnosis Date  . Anxiety and depression   . Bipolar 1 disorder ([place])   . Blood transfusion without reported diagnosis   . Chronic kidney disease   . Depression   . GERD (gastroesophageal reflux disease)   . Hypercholesterolemia   . Hypertension   . Sleep apnea   . Type 2 diabetes mellitus [facility])     Patient Active Problem List   Diagnosis Date Noted  . Vitamin B 12 deficiency 07/28/2017  . Tobacco abuse 05/05/2017  . Preventative health care 04/24/2017  . Numbness and tingling 04/15/2017  . OSA (obstructive sleep apnea) 02/07/2017  . Snoring 10/22/2016  . Nausea 09/10/2016  . Epigastric abdominal tenderness without rebound tenderness 09/10/2016  . GERD without esophagitis 09/10/2016  . Nonspecific abnormal electrocardiogram (ECG) (EKG) 09/10/2016  . Lower extremity edema 08/12/2016  . Type 2 diabetes mellitus without complication, without long-term current use of insulin ([place]) 06/26/2016  . Anxiety and depression 06/26/2016  . Hypertension 06/26/2016  .  Hyperlipidemia 06/26/2016  . Chronic neck pain 06/26/2016    Past Surgical History:  Procedure Laterality Date  . L hand surgery    . R knee arthoroscopy    . SPLENECTOMY          Home Medications    Prior to Admission medications   Medication Sig Start Date End Date Taking? Authorizing Provider  Ascorbic Acid (VITAMIN C) 1000 MG tablet Take 1,000 mg by mouth daily.   Yes [provider]  atenolol (TENORMIN) 50 MG tablet Take 1 tablet (50 mg total) daily by mouth. 02/06/17  Yes Pleas Koch, NP  atorvastatin (LIPITOR) 20 MG tablet Take 1 tablet (20 mg total) by mouth every evening. 04/24/17  Yes Pleas Koch, NP  diclofenac (VOLTAREN) 75 MG EC tablet Take 75 mg by mouth daily.  04/11/17  Yes [provider]  fluticasone (FLONASE) 50 MCG/ACT nasal spray Place 2 sprays into both nostrils daily. 06/24/17  Yes Yu, Amy V, PA-C  GARLIC PO Take 1 tablet by mouth daily.    Yes [provider]  Ginseng (GIN-ZING PO) Take 1 capsule by mouth daily.    Yes [provider]  hydrochlorothiazide (HYDRODIURIL) 25 MG tablet Take 1 tablet (25 mg total) daily by mouth. 02/06/17  Yes Pleas Koch, NP  lisinopril (PRINIVIL,ZESTRIL) 40 MG tablet Take 1 tablet (40 mg total) by mouth daily. 04/24/17  Yes Pleas Koch, NP  metFORMIN (GLUCOPHAGE) 500 MG tablet Take 1 tablet (500  mg total) by mouth 2 (two) times daily with a meal. 04/24/17  Yes Pleas Koch, NP  methocarbamol (ROBAXIN) 500 MG tablet Take 500 mg by mouth daily as needed for muscle spasms.  04/11/17  Yes [provider]  Multiple Vitamins-Minerals (MULTIVITAMIN ADULT) TABS Take 1 tablet by mouth daily.   Yes [provider]  Omega-3 Fatty Acids (OMEGA 3 PO) Take 1 capsule by mouth daily.   Yes [provider]  sertraline (ZOLOFT) 50 MG tablet Take 1 tablet (50 mg total) daily by mouth. 05/14/17  Yes Pleas Koch, NP  albuterol (PROVENTIL HFA;VENTOLIN HFA)  108 (90 Base) MCG/ACT inhaler Inhale 1-2 puffs into the lungs every 6 (six) hours as needed for wheezing or shortness of breath. Patient not taking: Reported on 08/05/2017 06/08/17   Zigmund Gottron, NP    Family History Family History  Problem Relation Age of Onset  . Hypertension Mother   . Mental illness Mother   . Diabetes Mother   . Hypertension Father   . Lung cancer Father   . Diabetes Paternal Grandmother   . Colon cancer Maternal Uncle   . Esophageal cancer Neg Hx   . Liver cancer Neg Hx   . Pancreatic cancer Neg Hx   . Rectal cancer Neg Hx   . Stomach cancer Neg Hx     Social History Social History   Tobacco Use  . Smoking status: Former Smoker    Packs/day: 0.50    Years: 41.00    Pack years: 20.50    Types: Cigarettes  . Smokeless tobacco: Never Used  . Tobacco comment: Quit in Feb.   Substance Use Topics  . Alcohol use: No  . Drug use: No     Allergies   Patient has no known allergies.   Review of Systems Review of Systems  Constitutional: Negative for chills and fever.  Respiratory: Negative for shortness of breath.   Cardiovascular: Negative for chest pain.  Gastrointestinal: Negative for abdominal pain, diarrhea, nausea and vomiting.  Musculoskeletal:       Lower extremity swelling and pain  Skin: Negative for color change.  Neurological: Negative for weakness and numbness.  All other systems reviewed and are negative.    Physical Exam Updated Vital Signs BP 135/79 (BP Location: Left Arm)   Pulse 62   Temp 97.9 F (36.6 C) (Oral)   Resp 15   Ht 5\' 9"  (1.753 m)   Wt 107.5 kg (236 lb 14.4 oz)   SpO2 97%   BMI 34.98 kg/m   Physical Exam  Constitutional: He appears well-developed and well-nourished. No distress.  HENT:  Head: Normocephalic and atraumatic.  Eyes: Conjunctivae are normal.  Neck: Neck supple.  Cardiovascular: Normal rate, regular rhythm, normal heart sounds and intact distal pulses.  Pulmonary/Chest: Effort normal and  breath sounds normal. No respiratory distress.  Abdominal: Soft. There is no tenderness. There is no guarding.  Musculoskeletal: He exhibits edema and tenderness.  Erythema to the bilateral lower extremities that the patient states is not new. Pitting edema noted to the anterior bilateral lower legs.  Swelling to the bilateral calves, worse on the left. Tenderness to the bilateral calves, worse on the left. No noted increased warmth to the lower extremities. Full range of motion in the bilateral knees and ankles.  Lymphadenopathy:    He has no cervical adenopathy.  Neurological: He is alert.  Sensation intact in the bilateral lower extremities. 5/5 strength in the bilateral lower extremities.  Skin: Skin is warm and dry. Capillary refill takes less than 2 seconds. He is not diaphoretic.  Psychiatric: He has a normal mood and affect. His behavior is normal.  Nursing note and vitals reviewed.    ED Treatments / Results  Labs (all labs ordered are listed, but only abnormal results are displayed) Labs Reviewed  COMPREHENSIVE METABOLIC PANEL - Abnormal; Notable for the following components:      Result Value   Potassium 3.4 (*)    Glucose, Bld 141 (*)    All other components within normal limits  CBC WITH DIFFERENTIAL/PLATELET - Abnormal; Notable for the following components:   WBC 12.3 (*)    RBC 4.02 (*)    Platelets 444 (*)    Lymphs Abs 5.4 (*)    Monocytes Absolute 1.7 (*)    Eosinophils Absolute 0.8 (*)    All other components within normal limits    EKG None  Radiology Dg Chest 2 View  Result Date: 08/05/2017 CLINICAL DATA:  62 y/o  M; edema of the legs for over 1 week. EXAM: CHEST - 2 VIEW COMPARISON:  06/15/2016 chest radiograph FINDINGS: Stable heart size and mediastinal contours are within normal limits. Surgical clips project over the left hemidiaphragm. Both lungs are clear. The visualized skeletal structures are unremarkable. IMPRESSION: No acute pulmonary process  identified. Electronically Signed   By: Kristine Garbe M.D.   On: 08/05/2017 06:01    Procedures Procedures (including critical care time)  Medications Ordered in ED Medications - No data to display   Initial Impression / Assessment and Plan / ED Course  I have reviewed the triage vital signs and the nursing notes.  Pertinent labs & imaging results that were available during my care of the patient were reviewed by me and considered in my medical decision making (see chart for details).     Patient presents with bilateral lower extremity edema and discomfort.  PAD is a possibility, but patient does have good pulses in the lower extremities.  DVT will need to be ruled out.  Patient was given the option to wait for ultrasound for be discharged and then returned later.  Patient opted to wait.  Low suspicion for heart failure or cellulitis.  End of shift patient care handoff report given to Chrissie Noa Dorothea Ogle) [place], PA-C. Plan: Duplex ultrasound.  If negative for DVT, patient to be discharged for follow-up with PCP and vascular specialist.   Findings and plan of care discussed with Malachy Moan, MD.   Final Clinical Impressions(s) / ED Diagnoses   Final diagnoses:  Bilateral lower extremity edema    ED Discharge Orders    None       Layla Maw 08/05/17 6314    Orpah Greek, MD 08/06/17 [phone]

## 2017-08-05 NOTE — ED Provider Notes (Signed)
Care assumed from previous provider PA Joy. Please see their note for further details to include full history and physical. To summarize in short pt is a 62 year old male history of hypertension, bipolar, diabetes, GERD presents to the ED for bilateral lower extremity edema and pain worse in the past 2 weeks.. Case discussed, plan agreed upon.   At time of care handoff was awaiting DVT ultrasound.  Ultrasound was negative for any acute DVTs.  Does note bilateral calf edema.  He also noted an enlarged left groin lymph node.  On my assessment patient's lower extremity's are warm to touch and erythematous.  Patient has no systemic symptoms including fever.  Does not meet Sirs or sepsis criteria.  I suspect the patient's symptoms are secondary to venous stasis.  However given the enlarged lymph node with the elevated white count will start patient on Keflex.  Discussed compression stockings for home use.  Encouraged follow-up with primary care for further work-up and possibly Lasix if indicated.  Pt is hemodynamically stable, in NAD, & able to ambulate in the ED. Evaluation does not show pathology that would require ongoing emergent intervention or inpatient treatment. I explained the diagnosis to the patient. Pain has been managed & has no complaints prior to dc. Pt is comfortable with above plan and is stable for discharge at this time. All questions were answered prior to disposition. Strict return precautions for f/u to the ED were discussed. Encouraged follow up with PCP.        Doristine Devoid, PA-C 08/05/17 1007    Orpah Greek, MD 08/06/17 [phone]

## 2017-08-05 NOTE — Progress Notes (Signed)
Preliminary notes--Bilateral lower extremities venous duplex study completed. Negative for DVT.  Bilateral calf edema noted. Left groin area prominent lymph node noted.   Hongying Cole (RDMS RVT) 08/05/17 9:00 AM

## 2017-08-05 NOTE — Discharge Instructions (Addendum)
Your ultrasound did not show any signs of a blood clot.  Does show fluid in both legs which may be consistent with venous stasis.  Have also discussed the enlarged lymph node in your left groin that needs to be followed up with the primary care doctor.  Would benefit from compression stockings.  Have given you a short course of antibiotics if your redness gets worse if you develop any fevers to take this for cellulitis.  Follow-up with primary care doctor return the ED with any worsening symptoms.

## 2017-08-06 DIAGNOSIS — G4733 Obstructive sleep apnea (adult) (pediatric): Secondary | ICD-10-CM | POA: Diagnosis not present

## 2017-08-19 LAB — HM DIABETES EYE EXAM

## 2017-08-20 ENCOUNTER — Encounter (HOSPITAL_COMMUNITY): Payer: Self-pay | Admitting: Emergency Medicine

## 2017-08-20 ENCOUNTER — Ambulatory Visit (HOSPITAL_COMMUNITY)
Admission: EM | Admit: 2017-08-20 | Discharge: 2017-08-20 | Disposition: A | Discharge status: Home / Self Care | Payer: Medicare Other | Attending: Family Medicine | Admitting: Family Medicine

## 2017-08-20 ENCOUNTER — Ambulatory Visit (INDEPENDENT_AMBULATORY_CARE_PROVIDER_SITE_OTHER): Payer: Medicare Other

## 2017-08-20 DIAGNOSIS — W2209XA Striking against other stationary object, initial encounter: Secondary | ICD-10-CM

## 2017-08-20 DIAGNOSIS — S60222A Contusion of left hand, initial encounter: Secondary | ICD-10-CM

## 2017-08-20 DIAGNOSIS — M79642 Pain in left hand: Secondary | ICD-10-CM | POA: Diagnosis not present

## 2017-08-20 NOTE — ED Triage Notes (Signed)
Pt here for left hand pain after hitting on chair

## 2017-08-20 NOTE — Discharge Instructions (Signed)
You may use over the counter ibuprofen or acetaminophen as needed.  ° °

## 2017-08-27 ENCOUNTER — Ambulatory Visit (INDEPENDENT_AMBULATORY_CARE_PROVIDER_SITE_OTHER): Payer: Medicare Other

## 2017-08-27 ENCOUNTER — Other Ambulatory Visit (INDEPENDENT_AMBULATORY_CARE_PROVIDER_SITE_OTHER): Payer: Medicare Other

## 2017-08-27 DIAGNOSIS — E538 Deficiency of other specified B group vitamins: Secondary | ICD-10-CM | POA: Diagnosis not present

## 2017-08-27 DIAGNOSIS — E119 Type 2 diabetes mellitus without complications: Secondary | ICD-10-CM

## 2017-08-27 LAB — POCT GLYCOSYLATED HEMOGLOBIN (HGB A1C): Hemoglobin A1C: 7.1 % — AB (ref 4.0–5.6)

## 2017-08-27 MED ORDER — CYANOCOBALAMIN 1000 MCG/ML IJ SOLN
1000.0000 ug | Freq: Once | INTRAMUSCULAR | Status: AC
  Administered 2017-08-27: 1000 ug via INTRAMUSCULAR

## 2017-08-27 NOTE — ED Provider Notes (Signed)
Kenneth [name]   [id number] 08/20/17 Arrival Time: 1800  ASSESSMENT & PLAN:  1. Contusion of left hand, initial encounter     Imaging: Dg Hand Complete Left  Result Date: 08/20/2017 CLINICAL DATA:  Left hand pain post hand being hung up in a plastic chair. EXAM: LEFT HAND - COMPLETE 3+ VIEW COMPARISON:  None. FINDINGS: There is no evidence of acute fracture or dislocation. Post prior amputation of the second digit at the level of the proximal phalanx. Prior fixation of the third proximal interphalangeal joint. Arthritic changes of the distal interphalangeal joints and radiocarpal joint. IMPRESSION: No acute fracture or dislocation identified about the left hand. Atretic changes of the distal interphalangeal joints and the radiocarpal joint. Electronically Signed   By: Fidela Salisbury M.D.   On: 08/20/2017 19:26   OTC analgesics. Ice to hand. May f/u as needed.  Reviewed expectations re: course of current medical issues. Questions answered. Outlined signs and symptoms indicating need for more acute intervention. Patient verbalized understanding. After Visit Summary given.  SUBJECTIVE: History from: patient. Kenneth [name] is a 62 y.o. male who reports persistent mild pain of his left hand that is stable; described as aching without radiation. Onset: abrupt, today. Injury/trama: yes, reports hitting hand on a chair. Relieved by: rest. Worsened by: certain movements. Associated symptoms: none reported. Extremity sensation changes or weakness: none. Self treatment: has not tried OTCs for relief of pain. History of similar: no  ROS: As per HPI.   OBJECTIVE:  Vitals:   08/20/17 1832  BP: (!) 138/99  Pulse: 82  Resp: 18  Temp: 98.7 F (37.1 C)  TempSrc: Oral  SpO2: 97%    General appearance: alert; no distress Extremities: warm and well perfused; symmetrical with no gross deformities; diffuse mild tenderness over his left dorsal hand with no swelling and no  bruising; ROM: normal CV: normal extremity capillary refill Skin: warm and dry Neurologic: normal gait; normal symmetric reflexes in all extremities; normal sensation in all extremities Psychological: alert and cooperative; normal mood and affect  No Known Allergies  Past Medical History:  Diagnosis Date  . Anxiety and depression   . Bipolar 1 disorder ([place])   . Blood transfusion without reported diagnosis   . Chronic kidney disease   . Depression   . GERD (gastroesophageal reflux disease)   . Hypercholesterolemia   . Hypertension   . Sleep apnea   . Type 2 diabetes mellitus ([place])    Social History   Socioeconomic History  . Marital status: Divorced    Spouse name: Not on file  . Number of children: Not on file  . Years of education: Not on file  . Highest education level: Not on file  Occupational History  . Not on file  Social Needs  . Financial resource strain: Not on file  . Food insecurity:    Worry: Not on file    Inability: Not on file  . Transportation needs:    Medical: Not on file    Non-medical: Not on file  Tobacco Use  . Smoking status: Former Smoker    Packs/day: 0.50    Years: 41.00    Pack years: 20.50    Types: Cigarettes  . Smokeless tobacco: Never Used  . Tobacco comment: Quit in Feb.   Substance and Sexual Activity  . Alcohol use: No  . Drug use: No  . Sexual activity: Not on file  Lifestyle  . Physical activity:    Days per week: Not  on file    Minutes per session: Not on file  . Stress: Not on file  Relationships  . Social connections:    Talks on phone: Not on file    Gets together: Not on file    Attends religious service: Not on file    Active member of club or organization: Not on file    Attends meetings of clubs or organizations: Not on file    Relationship status: Not on file  . Intimate partner violence:    Fear of current or ex partner: Not on file    Emotionally abused: Not on file    Physically abused: Not on file     Forced sexual activity: Not on file  Other Topics Concern  . Not on file  Social History Narrative   Single.   1 daughter, 1 grandchild.   Retired. Once worked in [employer].   Enjoys sports, racing, traveling.    Family History  Problem Relation Age of Onset  . Hypertension Mother   . Mental illness Mother   . Diabetes Mother   . Hypertension Father   . Lung cancer Father   . Diabetes Paternal Grandmother   . Colon cancer Maternal Uncle   . Esophageal cancer Neg Hx   . Liver cancer Neg Hx   . Pancreatic cancer Neg Hx   . Rectal cancer Neg Hx   . Stomach cancer Neg Hx    Past Surgical History:  Procedure Laterality Date  . L hand surgery    . R knee arthoroscopy    . Frederich Chick, MD 09/11/17 [phone]

## 2017-08-27 NOTE — Progress Notes (Signed)
l °

## 2017-08-27 NOTE — Progress Notes (Signed)
Per orders of  Allie Bossier NP, injection of vit b 12 given by Ozzie Hoyle. Patient tolerated injection well.

## 2017-08-29 ENCOUNTER — Other Ambulatory Visit: Payer: Self-pay

## 2017-08-29 NOTE — Patient Outreach (Signed)
[facility]) Care Management  08/29/2017  Kenneth [name] December 15, 1955 [id number]   Medication Adherence call to Carmelia Roller left a message for patient to call back patient is past due on Lisinopril 40 mg and Atorvastatin 20 mg under [facility].   [facility] Management Direct Dial [phone]  Fax [phone] [email or web address]

## 2017-09-04 ENCOUNTER — Encounter: Payer: Self-pay | Admitting: Primary Care

## 2017-09-04 ENCOUNTER — Other Ambulatory Visit: Payer: Self-pay

## 2017-09-04 NOTE — Patient Outreach (Signed)
[facility]) Care Management  09/04/2017  [name] [birth year] [id number]   Medication Adherence call to Mr. Kenneth [name] spoke with patient he has gone over it with his doctor he wants to stop taking all his medication patient start using natural sources (vitamins, ginger, cinnamon) he said he is filling much better with out all the medication he was taking.patient told [facility] to send his medication just when he calls then in.Mr. Mcdaid its showing under [facility] Ins.past due on Lisinopril 40 mg and Atorvastatin 20 mg   [facility] Management Direct Dial [phone]  Fax [phone] [email or web address]

## 2017-09-18 DIAGNOSIS — S60031A Contusion of right middle finger without damage to nail, initial encounter: Secondary | ICD-10-CM | POA: Diagnosis not present

## 2017-09-30 ENCOUNTER — Ambulatory Visit (INDEPENDENT_AMBULATORY_CARE_PROVIDER_SITE_OTHER): Payer: Medicare Other

## 2017-09-30 ENCOUNTER — Other Ambulatory Visit: Payer: Self-pay

## 2017-09-30 DIAGNOSIS — E538 Deficiency of other specified B group vitamins: Secondary | ICD-10-CM

## 2017-09-30 MED ORDER — CYANOCOBALAMIN 1000 MCG/ML IJ SOLN
1000.0000 ug | Freq: Once | INTRAMUSCULAR | Status: AC
  Administered 2017-09-30: 1000 ug via INTRAMUSCULAR

## 2017-09-30 NOTE — Progress Notes (Signed)
Per orders of Alma Friendly, NP, injection of Vitamin B12 given by Lurlean Nanny. Patient tolerated injection well.

## 2017-09-30 NOTE — Patient Outreach (Signed)
[facility]) Care Management  09/30/2017  Kenneth [name] 1955/09/03 [id number]   Medication Adherence call to Mr. Kenneth [name] patient did not answer spoke with patient last month he said he has stop all his medication because he wants to try Natural remedies. Mr.Cookson is showing past due under [place].  [facility] Management Direct Dial [phone]  Fax [phone] [email or web address]

## 2017-10-11 ENCOUNTER — Other Ambulatory Visit: Payer: Self-pay | Admitting: Primary Care

## 2017-10-11 DIAGNOSIS — I10 Essential (primary) hypertension: Secondary | ICD-10-CM

## 2017-11-10 ENCOUNTER — Other Ambulatory Visit: Payer: Self-pay | Admitting: Primary Care

## 2017-12-09 ENCOUNTER — Encounter: Payer: Self-pay | Admitting: Family

## 2017-12-09 ENCOUNTER — Ambulatory Visit: Payer: Self-pay | Admitting: Primary Care

## 2017-12-09 ENCOUNTER — Ambulatory Visit (INDEPENDENT_AMBULATORY_CARE_PROVIDER_SITE_OTHER): Payer: Medicare Other | Admitting: Family

## 2017-12-09 VITALS — BP 132/74 | HR 60 | Temp 98.1°F | Ht 69.0 in | Wt 231.1 lb

## 2017-12-09 DIAGNOSIS — M7989 Other specified soft tissue disorders: Secondary | ICD-10-CM | POA: Diagnosis not present

## 2017-12-09 MED ORDER — CEPHALEXIN 500 MG PO CAPS: 500.0000 mg | ORAL_CAPSULE | Freq: Three times a day (TID) | ORAL | 0 refills | Status: DC

## 2017-12-09 NOTE — Progress Notes (Signed)
Kenneth [name] is a 62 y.o. male with the following history as recorded in EpicCare:  Patient Active Problem List   Diagnosis Date Noted  . Vitamin B 12 deficiency 07/28/2017  . Tobacco abuse 05/05/2017  . Preventative health care 04/24/2017  . Numbness and tingling 04/15/2017  . OSA (obstructive sleep apnea) 02/07/2017  . Snoring 10/22/2016  . Nausea 09/10/2016  . Epigastric abdominal tenderness without rebound tenderness 09/10/2016  . GERD without esophagitis 09/10/2016  . Nonspecific abnormal electrocardiogram (ECG) (EKG) 09/10/2016  . Lower extremity edema 08/12/2016  . Type 2 diabetes mellitus without complication, without long-term current use of insulin ([place]) 06/26/2016  . Anxiety and depression 06/26/2016  . Hypertension 06/26/2016  . Hyperlipidemia 06/26/2016  . Chronic neck pain 06/26/2016    Current Outpatient Medications  Medication Sig Dispense Refill  . Ascorbic Acid (VITAMIN C) 1000 MG tablet Take 1,000 mg by mouth daily.    Marland Kitchen atenolol (TENORMIN) 50 MG tablet Take 1 tablet (50 mg total) daily by mouth. 90 tablet 1  . atorvastatin (LIPITOR) 20 MG tablet Take 1 tablet (20 mg total) by mouth every evening. 90 tablet 3  . diclofenac (VOLTAREN) 75 MG EC tablet Take 75 mg by mouth daily.     . fluticasone (FLONASE) 50 MCG/ACT nasal spray Place 2 sprays into both nostrils daily. 1 g 0  . GARLIC PO Take 1 tablet by mouth daily.     . Ginseng (GIN-ZING PO) Take 1 capsule by mouth daily.     . hydrochlorothiazide (HYDRODIURIL) 25 MG tablet Take 1 tablet (25 mg total) daily by mouth. 90 tablet 1  . lisinopril (PRINIVIL,ZESTRIL) 40 MG tablet Take 1 tablet (40 mg total) by mouth daily. 90 tablet 3  . metFORMIN (GLUCOPHAGE) 500 MG tablet Take 1 tablet (500 mg total) by mouth 2 (two) times daily with a meal. 180 tablet 3  . methocarbamol (ROBAXIN) 500 MG tablet Take 500 mg by mouth daily as needed for muscle spasms.     . Multiple Vitamins-Minerals (MULTIVITAMIN ADULT) TABS Take 1  tablet by mouth daily.    . Omega-3 Fatty Acids (OMEGA 3 PO) Take 1 capsule by mouth daily.    . sertraline (ZOLOFT) 50 MG tablet Take 1 tablet (50 mg total) daily by mouth. 90 tablet 1  . cephALEXin (KEFLEX) 500 MG capsule Take 1 capsule (500 mg total) by mouth 3 (three) times daily. 21 capsule 0   Current Facility-Administered Medications  Medication Dose Route Frequency Provider Last Rate Last Dose  . 0.9 %  sodium chloride infusion  500 mL Intravenous Once Ladene Artist, MD        Allergies: Patient has no known allergies.  Past Medical History:  Diagnosis Date  . Anxiety and depression   . Bipolar 1 disorder ([place])   . Blood transfusion without reported diagnosis   . Chronic kidney disease   . Depression   . GERD (gastroesophageal reflux disease)   . Hypercholesterolemia   . Hypertension   . Sleep apnea   . Type 2 diabetes mellitus ([place])     Past Surgical History:  Procedure Laterality Date  . L hand surgery    . R knee arthoroscopy    . SPLENECTOMY      Family History  Problem Relation Age of Onset  . Hypertension Mother   . Mental illness Mother   . Diabetes Mother   . Hypertension Father   . Lung cancer Father   . Diabetes Paternal Grandmother   .  Colon cancer Maternal Uncle   . Esophageal cancer Neg Hx   . Liver cancer Neg Hx   . Pancreatic cancer Neg Hx   . Rectal cancer Neg Hx   . Stomach cancer Neg Hx     Social History   Tobacco Use  . Smoking status: Former Smoker    Packs/day: 0.50    Years: 41.00    Pack years: 20.50    Types: Cigarettes  . Smokeless tobacco: Never Used  . Tobacco comment: Quit in Feb.   Substance Use Topics  . Alcohol use: No    Subjective:  1 week history of left ankle pain/ swelling; "feels like a blood pressure cuff on my left ankle." Has had recurrent problems with swelling in his left lower extremity- was last seen in May with similar symptoms; was told to see vascular specialist but referral was not done; Denies any  chest pain or shortness of breath; no previous history of DVT; Patient is overdue to see his PCP for management of chronic care needs;   Objective:  Vitals:   12/09/17 1552  BP: 132/74  Pulse: 60  Temp: 98.1 F (36.7 C)  TempSrc: Oral  SpO2: 95%  Weight: 231 lb 1.3 oz (104.8 kg)  Height: 5\' 9"  (1.753 m)    General: Well developed, well nourished, in no acute distress  Skin : Warm and dry.  Head: Normocephalic and atraumatic  Lungs: Respirations unlabored; clear to auscultation bilaterally without wheeze, rales, rhonchi  CVS exam: normal rate and regular rhythm.  Abdomen: Soft; nontender; nondistended; normoactive bowel sounds; no masses or hepatosplenomegaly  Extremities: Left lower extremity swelling/ erythema; negative Homan's sign; Vessels: Symmetric bilaterally  Neurologic: Alert and oriented; speech intact; face symmetrical; moves all extremities well; CNII-XII intact without focal deficit  Assessment:  1. Swelling of left lower extremity     Plan:  Chronic issue for this patient; will update doppler to rule out DVT; Rx for Keflex- take as directed; refer to vascular surgery as has been discussed in the past; Keep planned follow-up with his PCP for next month.    No follow-ups on file.  Orders Placed This Encounter  Procedures  . Ambulatory referral to Vascular Surgery    Referral Priority:   Routine    Referral Type:   Surgical    Referral Reason:   Specialty Services Required    Requested Specialty:   Vascular Surgery    Number of Visits Requested:   1    Requested Prescriptions   Signed Prescriptions Disp Refills  . cephALEXin (KEFLEX) 500 MG capsule 21 capsule 0    Sig: Take 1 capsule (500 mg total) by mouth 3 (three) times daily.

## 2017-12-09 NOTE — Telephone Encounter (Signed)
Patient called and states that his left leg is painful, red, painful, swollen, He states is have a blister on it. Patient is requesting a call back. Please call CB# [phone]   Call to patient- patient has swelling in ankle with several cuts and blisters. He has had previous surgery to the ankle and has periodic swelling- but this time it seems to be worse. It is red and the swelling is not getting better. He is at higher risk for infection due to splenectomy.  Reason for Disposition . [1] Red area or streak [2] large (> 2 in. or 5 cm)  Answer Assessment - Initial Assessment Questions 1. ONSET: "When did the swelling start?" (e.g., minutes, hours, days)     Started 1 week ago- comes and goes- support stocking helps- but cuts circulation 2. LOCATION: "What part of the leg is swollen?"  "Are both legs swollen or just one leg?"     Left ankle and above the ankle bone mid- calf 3. SEVERITY: "How bad is the swelling?" (e.g., localized; mild, moderate, severe)  - Localized - small area of swelling localized to one leg  - MILD pedal edema - swelling limited to foot and ankle, pitting edema < 1/4 inch (6 mm) deep, rest and elevation eliminate most or all swelling  - MODERATE edema - swelling of lower leg to knee, pitting edema > 1/4 inch (6 mm) deep, rest and elevation only partially reduce swelling  - SEVERE edema - swelling extends above knee, facial or hand swelling present      moderate 4. REDNESS: "Does the swelling look red or infected?"     It is red- several cuts and a couple blister spots 5. PAIN: "Is the swelling painful to touch?" If so, ask: "How painful is it?"   (Scale 1-10; mild, moderate or severe)     No pain- tightness only 6. FEVER: "Do you have a fever?" If so, ask: "What is it, how was it measured, and when did it start?"      no 7. CAUSE: "What do you think is causing the leg swelling?"     Scar tissue- lack of elevation 8. MEDICAL HISTORY: "Do you have a history of heart  failure, kidney disease, liver failure, or cancer?"     no 9. RECURRENT SYMPTOM: "Have you had leg swelling before?" If so, ask: "When was the last time?" "What happened that time?"     Yes- support stockings, fluid pills 10. OTHER SYMPTOMS: "Do you have any other symptoms?" (e.g., chest pain, difficulty breathing)       no 11. PREGNANCY: "Is there any chance you are pregnant?" "When was your last menstrual period?"       n/a  Protocols used: LEG SWELLING AND EDEMA-A-AH

## 2017-12-10 ENCOUNTER — Ambulatory Visit (HOSPITAL_COMMUNITY)
Admission: RE | Admit: 2017-12-10 | Discharge: 2017-12-10 | Disposition: A | Discharge status: Home / Self Care | Payer: Medicare Other | Source: Ambulatory Visit | Attending: Cardiology | Admitting: Cardiology

## 2017-12-10 DIAGNOSIS — M7989 Other specified soft tissue disorders: Secondary | ICD-10-CM

## 2017-12-14 IMAGING — CT CT RENAL STONE PROTOCOL
2 of 3 series · 16 of 46 positions shown, 18 images · non-contrast
Comparison: Abdominal ultrasound 03/12/2016.

CLINICAL DATA: Urine discoloration with dysuria, low abdominal and
back pain for 1 day following yard work.

EXAM:
CT ABDOMEN AND PELVIS WITHOUT CONTRAST
TECHNIQUE: Multidetector CT imaging of the abdomen and pelvis was performed
following the standard protocol without IV contrast.

[Series 4: lung · axial · 0.87mm/px · z∈[-224,-126]mm · 13 of 57 slices shown, 15 images]
[im 4/57  soft-tissue]
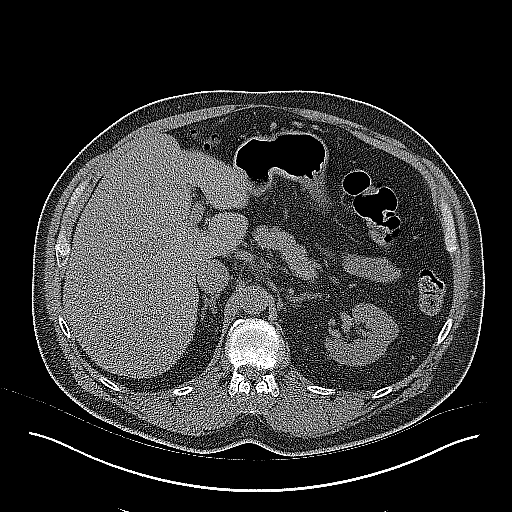
[im 4/57  bone]
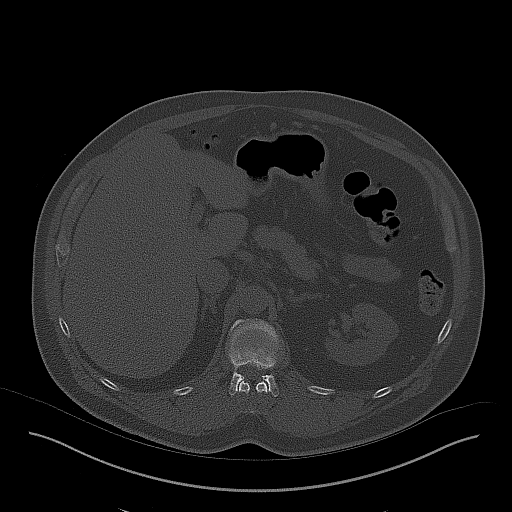
[im 8/57  soft-tissue]
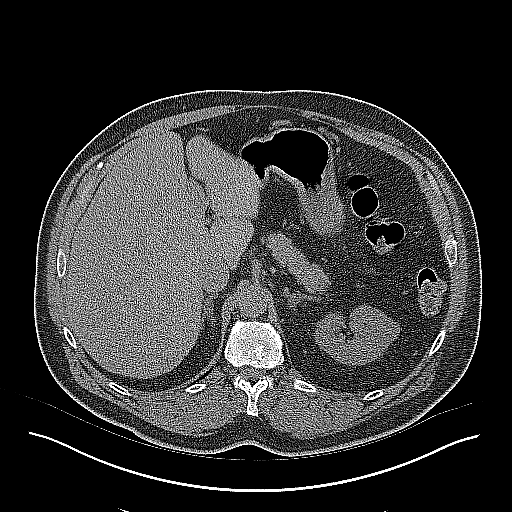
[im 11/57  soft-tissue]
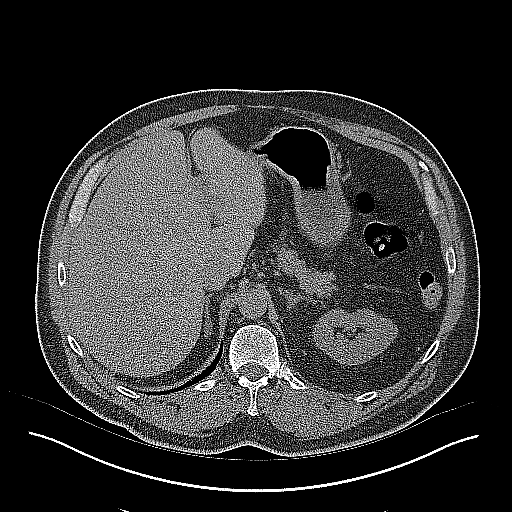
[im 17/57  soft-tissue]
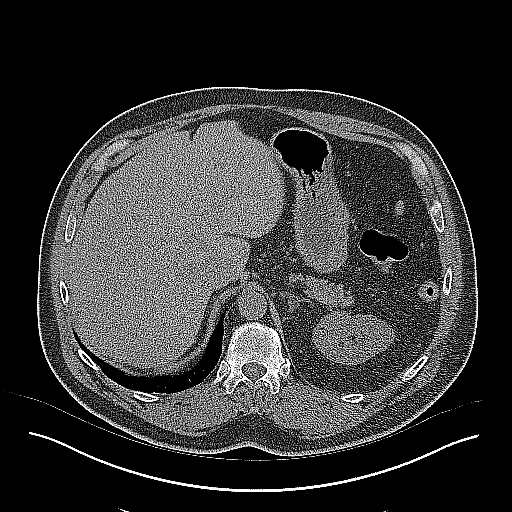
[im 20/57  soft-tissue]
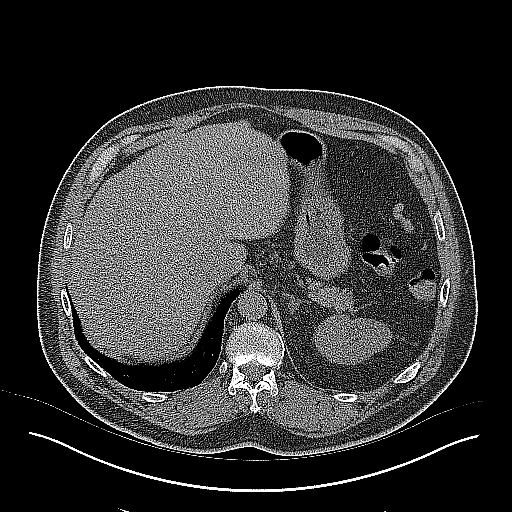
[im 24/57  soft-tissue]
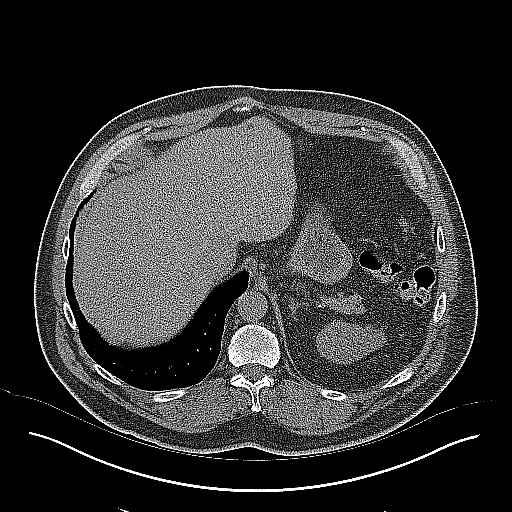
[im 29/57  soft-tissue]
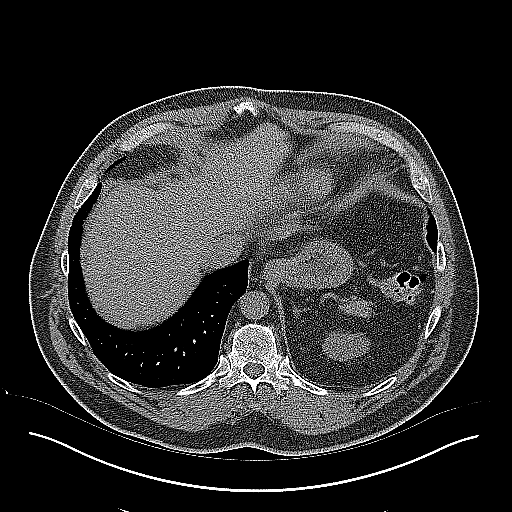
[im 33/57  soft-tissue]
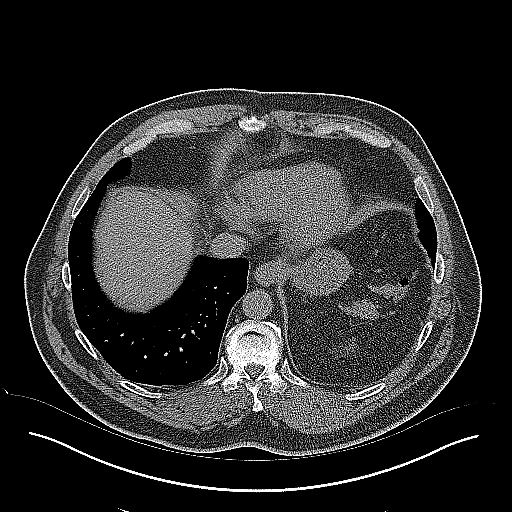
[im 37/57  soft-tissue]
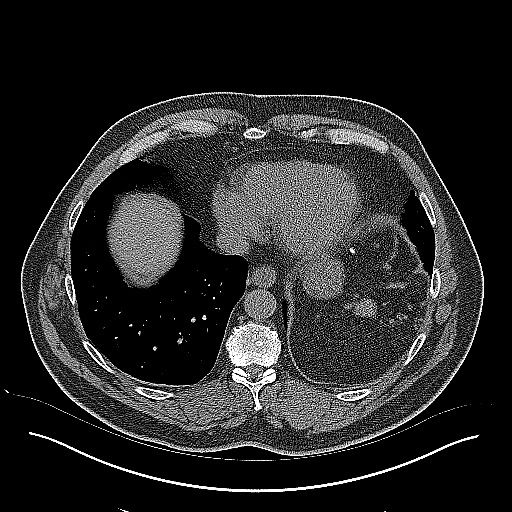
[im 37/57  bone]
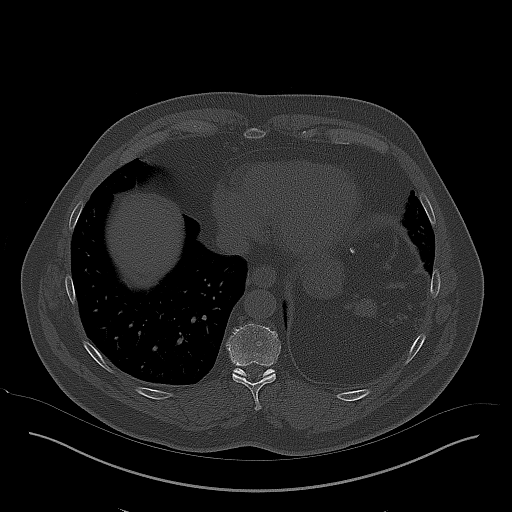
[im 40/57  soft-tissue]
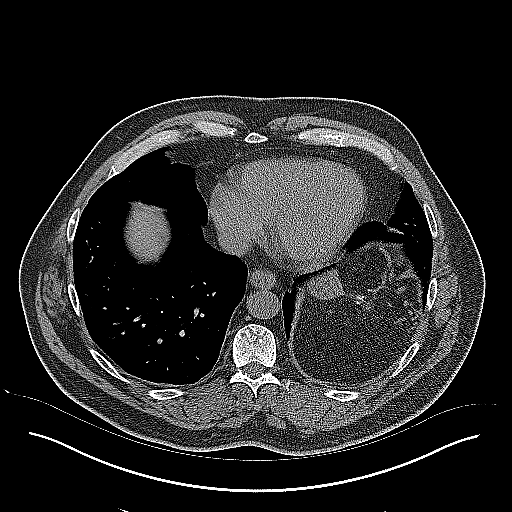
[im 46/57  soft-tissue]
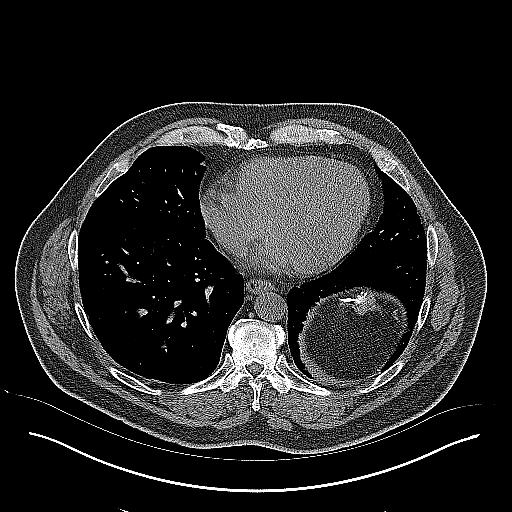
[im 49/57  soft-tissue]
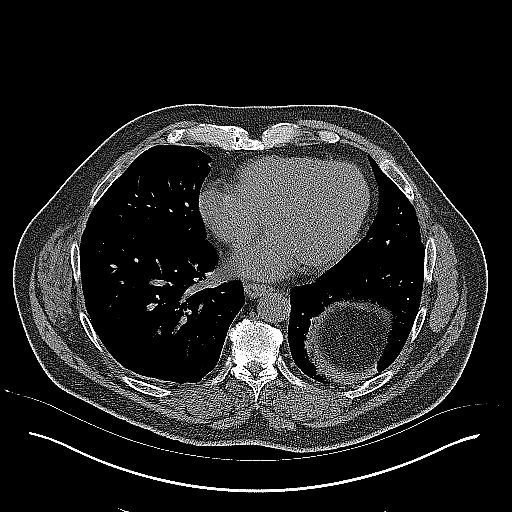
[im 53/57  soft-tissue]
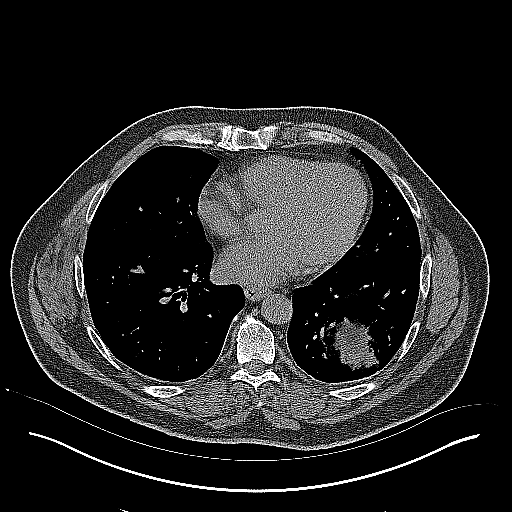

[Series 5: coronal · coronal · 0.82mm/px · 3 of 161 slices shown]
[im 54/161  soft-tissue]
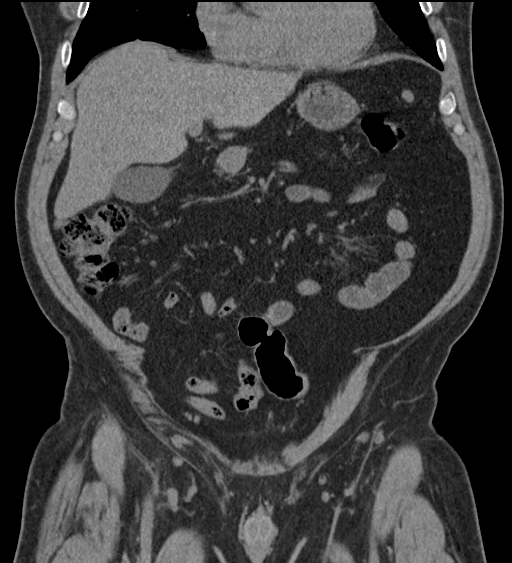
[im 72/161  soft-tissue]
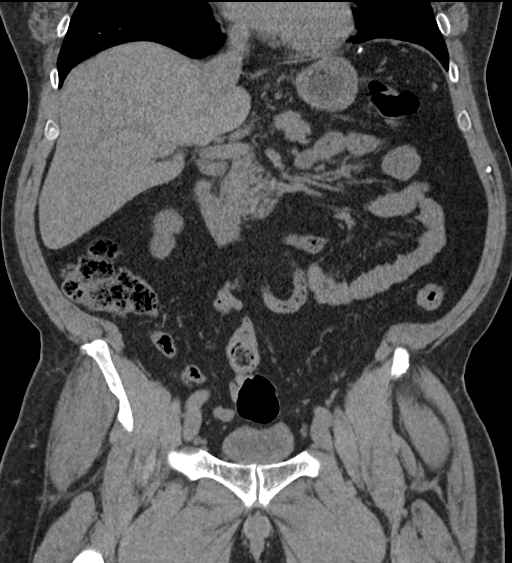
[im 89/161  soft-tissue]
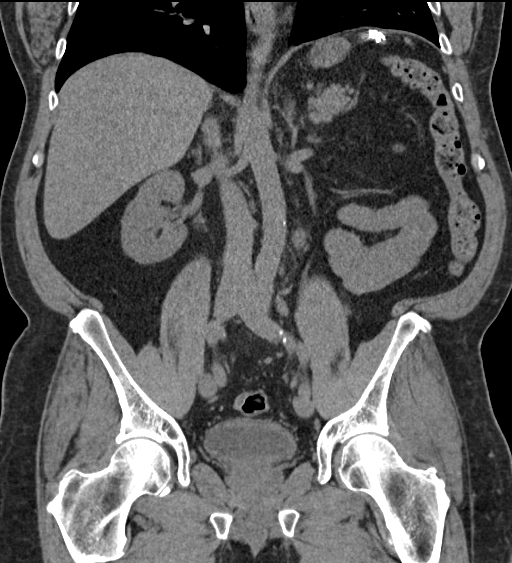

[16 of 46 positions shown; findings below may reference images not displayed]

FINDINGS: Lower chest: Mild atelectasis or scarring posteriorly at the left
lung base. No significant pleural or pericardial effusion.

Hepatobiliary: The liver appears unremarkable as imaged in the
noncontrast state. No evidence of gallstones, gallbladder wall
thickening or biliary dilatation.

Pancreas: Unremarkable. No pancreatic ductal dilatation or
surrounding inflammatory changes.

Spleen: There are subphrenic surgical clips consistent with previous
splenectomy. A small amount of regenerated splenic tissue is likely,
best seen on the reformatted images. This includes small components
anterior to the splenic flexure of the colon.

Adrenals/Urinary Tract: Both adrenal glands appear normal. There are
calculi in both renal pelves, measuring 10 x 7 mm on the left (image
26) and 10 x 8 mm on the right (image 36). No other urinary tract
calculi are seen. There is no hydronephrosis or secondary signs of
ureteral obstruction. Tiny exophytic lesion in the interpolar region
of the right kidney, too small to characterize. No significant
bladder findings.

Stomach/Bowel: No evidence of bowel wall thickening, distention or
surrounding inflammatory change. The appendix appears normal.

Vascular/Lymphatic: There are no enlarged abdominal or pelvic lymph
nodes. There is a 10 mm portacaval node on image 28 and a right
common iliac node measuring 9 mm on image 55. There is mild aortic
and branch vessel atherosclerosis.

Reproductive: The prostate gland and seminal vesicles demonstrate no
significant findings.

Other: No evidence of abdominal wall mass or hernia. No ascites.

Musculoskeletal: No acute osseous findings. There is lower lumbar
spondylosis with potentially symptomatic spinal stenosis, greatest
at L2-3 and L4-5. There is also some foraminal narrowing inferiorly.
IMPRESSION: 1. Sizable calculi within the renal pelves bilaterally. These have
the potential to intermittently obstruct the ureteropelvic junctions
with the patient erect. No current evidence of urinary tract
obstruction.
2. Postsurgical changes consistent with previous splenectomy. Small
amount of regenerated splenic tissue.
3. Small lymph nodes, probably reactive.
4. Mild atherosclerosis.
5. Lumbar spondylosis, potentially symptomatic.

## 2017-12-16 ENCOUNTER — Other Ambulatory Visit: Payer: Self-pay

## 2017-12-16 DIAGNOSIS — R609 Edema, unspecified: Secondary | ICD-10-CM

## 2018-01-08 ENCOUNTER — Other Ambulatory Visit: Payer: Self-pay | Admitting: Primary Care

## 2018-01-08 DIAGNOSIS — F419 Anxiety disorder, unspecified: Principal | ICD-10-CM

## 2018-01-08 DIAGNOSIS — F329 Major depressive disorder, single episode, unspecified: Secondary | ICD-10-CM

## 2018-01-08 DIAGNOSIS — F32A Depression, unspecified: Secondary | ICD-10-CM

## 2018-01-19 ENCOUNTER — Emergency Department (HOSPITAL_COMMUNITY)
Admission: EM | Admit: 2018-01-19 | Discharge: 2018-01-19 | Disposition: A | Discharge status: Home / Self Care | Payer: Medicare Other | Attending: Emergency Medicine | Admitting: Emergency Medicine

## 2018-01-19 ENCOUNTER — Emergency Department (HOSPITAL_COMMUNITY): Payer: Medicare Other

## 2018-01-19 ENCOUNTER — Encounter (HOSPITAL_COMMUNITY): Payer: Self-pay | Admitting: Oncology

## 2018-01-19 ENCOUNTER — Other Ambulatory Visit: Payer: Self-pay

## 2018-01-19 DIAGNOSIS — E1122 Type 2 diabetes mellitus with diabetic chronic kidney disease: Secondary | ICD-10-CM | POA: Insufficient documentation

## 2018-01-19 DIAGNOSIS — Z7984 Long term (current) use of oral hypoglycemic drugs: Secondary | ICD-10-CM | POA: Insufficient documentation

## 2018-01-19 DIAGNOSIS — M79672 Pain in left foot: Secondary | ICD-10-CM | POA: Insufficient documentation

## 2018-01-19 DIAGNOSIS — Z87891 Personal history of nicotine dependence: Secondary | ICD-10-CM | POA: Diagnosis not present

## 2018-01-19 DIAGNOSIS — N189 Chronic kidney disease, unspecified: Secondary | ICD-10-CM | POA: Diagnosis not present

## 2018-01-19 DIAGNOSIS — I129 Hypertensive chronic kidney disease with stage 1 through stage 4 chronic kidney disease, or unspecified chronic kidney disease: Secondary | ICD-10-CM | POA: Diagnosis not present

## 2018-01-19 MED ORDER — ACETAMINOPHEN 500 MG PO TABS
500.0000 mg | ORAL_TABLET | Freq: Once | ORAL | Status: AC
  Administered 2018-01-19: 500 mg via ORAL
  Filled 2018-01-19: qty 1

## 2018-01-19 NOTE — ED Provider Notes (Signed)
[name]PART[facility] Provider Note   CSN: [id number] Arrival date & time: 01/19/18  0221     History   Chief Complaint Chief Complaint  Patient presents with  . Foot Pain    HPI Kenneth [name] is a 62 y.o. male.   62 year old male presents to the emergency department for evaluation of left foot pain which began 1 week ago.  He states that he was walking when he felt onset of pain to the top of his left foot, as though he had stepped improperly.  Denies twisting his foot or ankle at this time.  Has taken Tylenol with some mild improvement.  States that his pain is often resolved by morning, but will worsen throughout the day with ambulation.  Denies any numbness or weakness.  No associated fevers.  Has been compliant with all of his daily medications.  The history is provided by the patient. No language interpreter was used.  Foot Pain     Past Medical History:  Diagnosis Date  . Anxiety and depression   . Bipolar 1 disorder ([place])   . Blood transfusion without reported diagnosis   . Chronic kidney disease   . Depression   . GERD (gastroesophageal reflux disease)   . Hypercholesterolemia   . Hypertension   . Sleep apnea   . Type 2 diabetes mellitus [facility])     Patient Active Problem List   Diagnosis Date Noted  . Vitamin B 12 deficiency 07/28/2017  . Tobacco abuse 05/05/2017  . Preventative health care 04/24/2017  . Numbness and tingling 04/15/2017  . OSA (obstructive sleep apnea) 02/07/2017  . Snoring 10/22/2016  . Nausea 09/10/2016  . Epigastric abdominal tenderness without rebound tenderness 09/10/2016  . GERD without esophagitis 09/10/2016  . Nonspecific abnormal electrocardiogram (ECG) (EKG) 09/10/2016  . Lower extremity edema 08/12/2016  . Type 2 diabetes mellitus without complication, without long-term current use of insulin ([place]) 06/26/2016  . Anxiety and depression 06/26/2016  . Hypertension 06/26/2016  . Hyperlipidemia  06/26/2016  . Chronic neck pain 06/26/2016    Past Surgical History:  Procedure Laterality Date  . L hand surgery    . R knee arthoroscopy    . SPLENECTOMY          Home Medications    Prior to Admission medications   Medication Sig Start Date End Date Taking? Authorizing Provider  Ascorbic Acid (VITAMIN C) 1000 MG tablet Take 1,000 mg by mouth daily.    [provider]  atenolol (TENORMIN) 50 MG tablet Take 1 tablet (50 mg total) daily by mouth. 10/13/17   Pleas Koch, NP  atorvastatin (LIPITOR) 20 MG tablet Take 1 tablet (20 mg total) by mouth every evening. 04/24/17   Pleas Koch, NP  cephALEXin (KEFLEX) 500 MG capsule Take 1 capsule (500 mg total) by mouth 3 (three) times daily. 12/09/17   Marrian Salvage, FNP  diclofenac (VOLTAREN) 75 MG EC tablet Take 75 mg by mouth daily.  04/11/17   [provider]  fluticasone (FLONASE) 50 MCG/ACT nasal spray Place 2 sprays into both nostrils daily. 06/24/17   Tasia Catchings, Amy V, PA-C  GARLIC PO Take 1 tablet by mouth daily.     [provider]  Ginseng (GIN-ZING PO) Take 1 capsule by mouth daily.     [provider]  hydrochlorothiazide (HYDRODIURIL) 25 MG tablet Take 1 tablet (25 mg total) daily by mouth. 11/11/17   Pleas Koch, NP  lisinopril (PRINIVIL,ZESTRIL) 40  MG tablet Take 1 tablet (40 mg total) by mouth daily. 04/24/17   Pleas Koch, NP  metFORMIN (GLUCOPHAGE) 500 MG tablet Take 1 tablet (500 mg total) by mouth 2 (two) times daily with a meal. 04/24/17   Pleas Koch, NP  methocarbamol (ROBAXIN) 500 MG tablet Take 500 mg by mouth daily as needed for muscle spasms.  04/11/17   [provider]  Multiple Vitamins-Minerals (MULTIVITAMIN ADULT) TABS Take 1 tablet by mouth daily.    [provider]  Omega-3 Fatty Acids (OMEGA 3 PO) Take 1 capsule by mouth daily.    [provider]  sertraline (ZOLOFT) 50 MG tablet Take 1 tablet (50 mg total) daily by  mouth. 01/08/18   Pleas Koch, NP    Family History Family History  Problem Relation Age of Onset  . Hypertension Mother   . Mental illness Mother   . Diabetes Mother   . Hypertension Father   . Lung cancer Father   . Diabetes Paternal Grandmother   . Colon cancer Maternal Uncle   . Esophageal cancer Neg Hx   . Liver cancer Neg Hx   . Pancreatic cancer Neg Hx   . Rectal cancer Neg Hx   . Stomach cancer Neg Hx     Social History Social History   Tobacco Use  . Smoking status: Former Smoker    Packs/day: 0.50    Years: 41.00    Pack years: 20.50    Types: Cigarettes  . Smokeless tobacco: Never Used  . Tobacco comment: Quit in Feb.   Substance Use Topics  . Alcohol use: No  . Drug use: No     Allergies   Patient has no known allergies.   Review of Systems Review of Systems Ten systems reviewed and are negative for acute change, except as noted in the HPI.    Physical Exam Updated Vital Signs BP (!) 151/80   Pulse 67   Temp 97.7 F (36.5 C) (Oral)   Resp 20   Ht 5\' 8"  (1.727 m)   Wt 106.6 kg   SpO2 96%   BMI 35.73 kg/m   Physical Exam  Constitutional: He is oriented to person, place, and time. He appears well-developed and well-nourished. No distress.  Nontoxic appearing and in NAD  HENT:  Head: Normocephalic and atraumatic.  Eyes: Conjunctivae and EOM are normal. No scleral icterus.  Neck: Normal range of motion.  Cardiovascular: Normal rate, regular rhythm and intact distal pulses.  DP pulse 2+ in the LLE. Brisk capillary refill.  Pulmonary/Chest: Effort normal. No stridor. No respiratory distress.  Respirations even and unlabored  Musculoskeletal: Normal range of motion.  TTP to the dorsum of the left midfoot without crepitus or deformity. No significant erythema or heat to touch. 1+ pitting edema in the RLE; 2+ edema in the LLE. Patient states this edema is baseline.  Neurological: He is alert and oriented to person, place, and time.    Skin: Skin is warm and dry. No rash noted. He is not diaphoretic. No erythema. No pallor.  Psychiatric: He has a normal mood and affect. His behavior is normal.  Nursing note and vitals reviewed.    ED Treatments / Results  Labs (all labs ordered are listed, but only abnormal results are displayed) Labs Reviewed - No data to display  EKG None  Radiology Dg Foot Complete Left  Result Date: 01/19/2018 CLINICAL DATA:  Left foot pain with injury approximately 1 week ago. EXAM: LEFT FOOT -  COMPLETE 3+ VIEW COMPARISON:  None. FINDINGS: There is no evidence of fracture or dislocation. There is no evidence of arthropathy or other focal bone abnormality. Soft tissues are unremarkable. IMPRESSION: Negative. Electronically Signed   By: Ulyses Jarred M.D.   On: 01/19/2018 03:29    Procedures Procedures (including critical care time)  Medications Ordered in ED Medications  acetaminophen (TYLENOL) tablet 500 mg (has no administration in time range)     Initial Impression / Assessment and Plan / ED Course  I have reviewed the triage vital signs and the nursing notes.  Pertinent labs & imaging results that were available during my care of the patient were reviewed by me and considered in my medical decision making (see chart for details).     Patient presents to the emergency department for evaluation of L foot pain. Patient neurovascularly intact on exam. Imaging negative for fracture, dislocation, bony deformity. No erythema, heat to touch to the affected area; no concern for septic joint. Compartments in the affected extremity are soft. Plan for supportive management including RICE and NSAIDs; primary care follow up as needed. Return precautions discussed and provided. Patient discharged in stable condition with no unaddressed concerns.   Final Clinical Impressions(s) / ED Diagnoses   Final diagnoses:  Foot pain, left    ED Discharge Orders    None       Antonietta Breach,  PA-C 01/19/18 9030    Ward, Delice Bison, DO 01/19/18 0600

## 2018-01-19 NOTE — ED Triage Notes (Signed)
Pt reports left foot pain w/o injury approximately one week ago.

## 2018-01-19 NOTE — ED Notes (Signed)
Patient transported to X-ray 

## 2018-01-19 NOTE — Discharge Instructions (Signed)
Continue taking your daily medications.  We recommend that you keep your leg elevated as much as possible.  You may benefit from wrapping your foot with an Ace wrap for stability and comfort.  Continue Tylenol as needed for pain control.  Follow-up with your primary care doctor to ensure resolution of symptoms.

## 2018-01-28 ENCOUNTER — Ambulatory Visit: Payer: Medicare Other | Admitting: Primary Care

## 2018-02-03 ENCOUNTER — Ambulatory Visit (INDEPENDENT_AMBULATORY_CARE_PROVIDER_SITE_OTHER): Payer: Medicare Other | Admitting: Primary Care

## 2018-02-03 ENCOUNTER — Encounter: Payer: Self-pay | Admitting: Primary Care

## 2018-02-03 ENCOUNTER — Other Ambulatory Visit: Payer: Self-pay | Admitting: Primary Care

## 2018-02-03 VITALS — BP 134/76 | HR 82 | Temp 98.3°F | Ht 69.0 in | Wt 238.5 lb

## 2018-02-03 DIAGNOSIS — E119 Type 2 diabetes mellitus without complications: Secondary | ICD-10-CM | POA: Diagnosis not present

## 2018-02-03 DIAGNOSIS — E538 Deficiency of other specified B group vitamins: Secondary | ICD-10-CM | POA: Diagnosis not present

## 2018-02-03 LAB — VITAMIN B12: Vitamin B-12: 203 pg/mL — ABNORMAL LOW (ref 211–911)

## 2018-02-03 LAB — HEMOGLOBIN A1C: Hgb A1c MFr Bld: 7.2 % — ABNORMAL HIGH (ref 4.6–6.5)

## 2018-02-03 MED ORDER — METFORMIN HCL 1000 MG PO TABS
1000.0000 mg | ORAL_TABLET | Freq: Two times a day (BID) | ORAL | 1 refills | Status: DC
Start: 2018-02-03 — End: 2018-05-08

## 2018-02-03 NOTE — Patient Instructions (Addendum)
Stop by the lab prior to leaving today. I will notify you of your results once received.   Continue taking Metformin 500 mg tablets twice daily for now. We will call you if there are any changes.  Stop drinking soda, sweet tea. Stop eating fast food and candy.  Reschedule your Wellness Visit and physical for after February 5th 2020.  It was a pleasure to see you today!   Diabetes Mellitus and Nutrition When you have diabetes (diabetes mellitus), it is very important to have healthy eating habits because your blood sugar (glucose) levels are greatly affected by what you eat and drink. Eating healthy foods in the appropriate amounts, at about the same times every day, can help you:  Control your blood glucose.  Lower your risk of heart disease.  Improve your blood pressure.  Reach or maintain a healthy weight.  Every person with diabetes is different, and each person has different needs for a meal plan. Your health care provider may recommend that you work with a diet and nutrition specialist (dietitian) to make a meal plan that is best for you. Your meal plan may vary depending on factors such as:  The calories you need.  The medicines you take.  Your weight.  Your blood glucose, blood pressure, and cholesterol levels.  Your activity level.  Other health conditions you have, such as heart or kidney disease.  How do carbohydrates affect me? Carbohydrates affect your blood glucose level more than any other type of food. Eating carbohydrates naturally increases the amount of glucose in your blood. Carbohydrate counting is a method for keeping track of how many carbohydrates you eat. Counting carbohydrates is important to keep your blood glucose at a healthy level, especially if you use insulin or take certain oral diabetes medicines. It is important to know how many carbohydrates you can safely have in each meal. This is different for every person. Your dietitian can help you  calculate how many carbohydrates you should have at each meal and for snack. Foods that contain carbohydrates include:  Bread, cereal, rice, pasta, and crackers.  Potatoes and corn.  Peas, beans, and lentils.  Milk and yogurt.  Fruit and juice.  Desserts, such as cakes, cookies, ice cream, and candy.  How does alcohol affect me? Alcohol can cause a sudden decrease in blood glucose (hypoglycemia), especially if you use insulin or take certain oral diabetes medicines. Hypoglycemia can be a life-threatening condition. Symptoms of hypoglycemia (sleepiness, dizziness, and confusion) are similar to symptoms of having too much alcohol. If your health care provider says that alcohol is safe for you, follow these guidelines:  Limit alcohol intake to no more than 1 drink per day for nonpregnant women and 2 drinks per day for men. One drink equals 12 oz of beer, 5 oz of wine, or 1 oz of hard liquor.  Do not drink on an empty stomach.  Keep yourself hydrated with water, diet soda, or unsweetened iced tea.  Keep in mind that regular soda, juice, and other mixers may contain a lot of sugar and must be counted as carbohydrates.  What are tips for following this plan? Reading food labels  Start by checking the serving size on the label. The amount of calories, carbohydrates, fats, and other nutrients listed on the label are based on one serving of the food. Many foods contain more than one serving per package.  Check the total grams (g) of carbohydrates in one serving. You can calculate the number of  servings of carbohydrates in one serving by dividing the total carbohydrates by 15. For example, if a food has 30 g of total carbohydrates, it would be equal to 2 servings of carbohydrates.  Check the number of grams (g) of saturated and trans fats in one serving. Choose foods that have low or no amount of these fats.  Check the number of milligrams (mg) of sodium in one serving. Most people should  limit total sodium intake to less than 2,300 mg per day.  Always check the nutrition information of foods labeled as "low-fat" or "nonfat". These foods may be higher in added sugar or refined carbohydrates and should be avoided.  Talk to your dietitian to identify your daily goals for nutrients listed on the label. Shopping  Avoid buying canned, premade, or processed foods. These foods tend to be high in fat, sodium, and added sugar.  Shop around the outside edge of the grocery store. This includes fresh fruits and vegetables, bulk grains, fresh meats, and fresh dairy. Cooking  Use low-heat cooking methods, such as baking, instead of high-heat cooking methods like deep frying.  Cook using healthy oils, such as olive, canola, or sunflower oil.  Avoid cooking with butter, cream, or high-fat meats. Meal planning  Eat meals and snacks regularly, preferably at the same times every day. Avoid going long periods of time without eating.  Eat foods high in fiber, such as fresh fruits, vegetables, beans, and whole grains. Talk to your dietitian about how many servings of carbohydrates you can eat at each meal.  Eat 4-6 ounces of lean protein each day, such as lean meat, chicken, fish, eggs, or tofu. 1 ounce is equal to 1 ounce of meat, chicken, or fish, 1 egg, or 1/4 cup of tofu.  Eat some foods each day that contain healthy fats, such as avocado, nuts, seeds, and fish. Lifestyle   Check your blood glucose regularly.  Exercise at least 30 minutes 5 or more days each week, or as told by your health care provider.  Take medicines as told by your health care provider.  Do not use any products that contain nicotine or tobacco, such as cigarettes and e-cigarettes. If you need help quitting, ask your health care provider.  Work with a Social worker or diabetes educator to identify strategies to manage stress and any emotional and social challenges. What are some questions to ask my health care  provider?  Do I need to meet with a diabetes educator?  Do I need to meet with a dietitian?  What number can I call if I have questions?  When are the best times to check my blood glucose? Where to find more information:  American Diabetes Association: diabetes.org/food-and-fitness/food  Academy of Nutrition and Dietetics: PokerClues.dk  [employer] of Diabetes and Digestive and Kidney Diseases (NIH): ContactWire.be Summary  A healthy meal plan will help you control your blood glucose and maintain a healthy lifestyle.  Working with a diet and nutrition specialist (dietitian) can help you make a meal plan that is best for you.  Keep in mind that carbohydrates and alcohol have immediate effects on your blood glucose levels. It is important to count carbohydrates and to use alcohol carefully. This information is not intended to replace advice given to you by your health care provider. Make sure you discuss any questions you have with your health care provider. Document Released: 12/13/2004 Document Revised: 04/22/2016 Document Reviewed: 04/22/2016 Elsevier Interactive Patient Education  [employer].

## 2018-02-03 NOTE — Progress Notes (Signed)
Subjective:    Patient ID: Kenneth [name], male    DOB: 1956/03/13, 62 y.o.   MRN: [id number]  HPI  Kenneth [name] is a 62 year old male who presents today for follow up of type 2 diabetes and vitamin B 12 injection.  Current medications include: Metformin 500 mg BID.   He is not checking his blood glucose.  Last A1C: 7.1 Last Eye Exam: Completed in May 2019 Last Foot Exam: due in April 2020 Pneumonia Vaccination: ACE/ARB: Lisinopril  Statin: atorvastatin   Diet currently consists of:  Breakfast: Oatmeal, sausage biscuit, cereal  Lunch: Fast food Dinner: Fast food, canned vegetables, sandwiches Snacks: Candy Desserts: 4 days weekly  Beverages: Coffee, soda, sweet tea, water, Gatorade  Exercise: He is active, no regular exercise   Last vitamin B 12 level was 154 in April 2019. He's had B 12 injections in March, April, May, July of 2019. He is due for repeat labs today.   Review of Systems  Respiratory: Negative for shortness of breath.   Cardiovascular: Negative for chest pain.  Neurological: Negative for numbness.       Occasional headaches       Past Medical History:  Diagnosis Date  . Anxiety and depression   . Bipolar 1 disorder ([place])   . Blood transfusion without reported diagnosis   . Chronic kidney disease   . Depression   . GERD (gastroesophageal reflux disease)   . Hypercholesterolemia   . Hypertension   . Sleep apnea   . Type 2 diabetes mellitus ([place])      Social History   Socioeconomic History  . Marital status: Divorced    Spouse name: Not on file  . Number of children: Not on file  . Years of education: Not on file  . Highest education level: Not on file  Occupational History  . Not on file  Social Needs  . Financial resource strain: Not on file  . Food insecurity:    Worry: Not on file    Inability: Not on file  . Transportation needs:    Medical: Not on file    Non-medical: Not on file  Tobacco Use  . Smoking status: Former Smoker     Packs/day: 0.50    Years: 41.00    Pack years: 20.50    Types: Cigarettes  . Smokeless tobacco: Never Used  . Tobacco comment: Quit in Feb.   Substance and Sexual Activity  . Alcohol use: No  . Drug use: No  . Sexual activity: Not on file  Lifestyle  . Physical activity:    Days per week: Not on file    Minutes per session: Not on file  . Stress: Not on file  Relationships  . Social connections:    Talks on phone: Not on file    Gets together: Not on file    Attends religious service: Not on file    Active member of club or organization: Not on file    Attends meetings of clubs or organizations: Not on file    Relationship status: Not on file  . Intimate partner violence:    Fear of current or ex partner: Not on file    Emotionally abused: Not on file    Physically abused: Not on file    Forced sexual activity: Not on file  Other Topics Concern  . Not on file  Social History Narrative   Single.   1 daughter, 1 grandchild.   Retired. Once worked  in [employer].   Enjoys sports, racing, traveling.     Past Surgical History:  Procedure Laterality Date  . L hand surgery    . R knee arthoroscopy    . SPLENECTOMY      Family History  Problem Relation Age of Onset  . Hypertension Mother   . Mental illness Mother   . Diabetes Mother   . Hypertension Father   . Lung cancer Father   . Diabetes Paternal Grandmother   . Colon cancer Maternal Uncle   . Esophageal cancer Neg Hx   . Liver cancer Neg Hx   . Pancreatic cancer Neg Hx   . Rectal cancer Neg Hx   . Stomach cancer Neg Hx     No Known Allergies  Current Outpatient Medications on File Prior to Visit  Medication Sig Dispense Refill  . Ascorbic Acid (VITAMIN C) 1000 MG tablet Take 1,000 mg by mouth daily.    Marland Kitchen atenolol (TENORMIN) 50 MG tablet Take 1 tablet (50 mg total) daily by mouth. 90 tablet 1  . atorvastatin (LIPITOR) 20 MG tablet Take 1 tablet (20 mg total) by mouth every evening. 90 tablet 3  . diclofenac  (VOLTAREN) 75 MG EC tablet Take 75 mg by mouth daily.     . fluticasone (FLONASE) 50 MCG/ACT nasal spray Place 2 sprays into both nostrils daily. 1 g 0  . GARLIC PO Take 1 tablet by mouth daily.     . Ginseng (GIN-ZING PO) Take 1 capsule by mouth daily.     . hydrochlorothiazide (HYDRODIURIL) 25 MG tablet Take 1 tablet (25 mg total) daily by mouth. 90 tablet 1  . lisinopril (PRINIVIL,ZESTRIL) 40 MG tablet Take 1 tablet (40 mg total) by mouth daily. 90 tablet 3  . metFORMIN (GLUCOPHAGE) 500 MG tablet Take 1 tablet (500 mg total) by mouth 2 (two) times daily with a meal. 180 tablet 3  . methocarbamol (ROBAXIN) 500 MG tablet Take 500 mg by mouth daily as needed for muscle spasms.     . Multiple Vitamins-Minerals (MULTIVITAMIN ADULT) TABS Take 1 tablet by mouth daily.    . Omega-3 Fatty Acids (OMEGA 3 PO) Take 1 capsule by mouth daily.    . sertraline (ZOLOFT) 50 MG tablet Take 1 tablet (50 mg total) daily by mouth. 90 tablet 0   Current Facility-Administered Medications on File Prior to Visit  Medication Dose Route Frequency Provider Last Rate Last Dose  . 0.9 %  sodium chloride infusion  500 mL Intravenous Once Ladene Artist, MD        BP 134/76   Pulse 82   Temp 98.3 F (36.8 C) (Oral)   Ht 5\' 9"  (1.753 m)   Wt 238 lb 8 oz (108.2 kg)   SpO2 93%   BMI 35.22 kg/m    Objective:   Physical Exam  Constitutional: He appears well-nourished.  Neck: Neck supple.  Cardiovascular: Normal rate and regular rhythm.  Respiratory: Effort normal and breath sounds normal.  Skin: Skin is warm and dry.           Assessment & Plan:

## 2018-02-03 NOTE — Assessment & Plan Note (Signed)
No injection since July 2019. B 12 labs pending.  Hold off on injection until labs received.

## 2018-02-03 NOTE — Assessment & Plan Note (Signed)
Repeat A1C pending. Continue metformin as prescribed for now.  Strongly advised he work on his diet by stopping fast food, candy, sugary drinks. Also recommended regular exercise.  Foot and eye exam UTD. Pneumonia vaccination UTD. Managed on statin and ACE.  Follow up in 6 3-6 months.

## 2018-02-05 ENCOUNTER — Telehealth: Payer: Self-pay | Admitting: *Deleted

## 2018-02-05 NOTE — Telephone Encounter (Signed)
-----   Message from Pleas Koch, NP sent at 02/03/2018  9:27 PM EST ----- Please notify patient:  A1C is increasing. I'd like to increase his metformin to 1000 mg twice daily. He may take two of the 500 mg tablets twice daily for now, but I'll go ahead and send the 1000 mg tablets to his pharmacy. Take one of the 1000 mg tablets twice daily. Needs to continue with monthly B12 injections for the next 6 months, please set up. We also need to move his appointment scheduled with me and labs to after February 5th. He may keep his appointment with Katha Cabal.

## 2018-02-05 NOTE — Telephone Encounter (Signed)
Message left for patient to return my call.  

## 2018-02-09 ENCOUNTER — Ambulatory Visit (INDEPENDENT_AMBULATORY_CARE_PROVIDER_SITE_OTHER): Payer: Medicare Other | Admitting: Surgery

## 2018-02-09 ENCOUNTER — Other Ambulatory Visit: Payer: Self-pay

## 2018-02-09 ENCOUNTER — Ambulatory Visit (HOSPITAL_COMMUNITY)
Admission: RE | Admit: 2018-02-09 | Discharge: 2018-02-09 | Disposition: A | Discharge status: Home / Self Care | Payer: Medicare Other | Source: Ambulatory Visit | Attending: Primary Care | Admitting: Primary Care

## 2018-02-09 ENCOUNTER — Encounter: Payer: Self-pay | Admitting: Surgery

## 2018-02-09 VITALS — BP 125/69 | HR 59 | Temp 97.1°F | Resp 16 | Ht 68.0 in | Wt 239.0 lb

## 2018-02-09 DIAGNOSIS — R609 Edema, unspecified: Secondary | ICD-10-CM | POA: Diagnosis not present

## 2018-02-09 NOTE — Progress Notes (Signed)
[facility]  Patient name: Kenneth [name] MRN: [id number] DOB: 1956-01-11 Sex: male   REQUESTING PROVIDER:    Dr. Valere Dross   REASON FOR CONSULT:    Leg swelling  HISTORY OF PRESENT ILLNESS:   Kenneth [name] is a 62 y.o. male, who is referred today for evaluation of leg swelling.  He states that this is been going on for several years.  He does complain of pain at the site where he had an Achilles tendon repair in his left ankle many years ago.  He will occasionally wear compression stockings but does not like them because of comfort issues.  He does not have any open wounds.  Patient suffers from chronic renal insufficiency.  He has type 2 diabetes.  He is medically managed for hypertension.  He is a former smoker.  PAST MEDICAL HISTORY    Past Medical History:  Diagnosis Date  . Anxiety and depression   . Bipolar 1 disorder ([place])   . Blood transfusion without reported diagnosis   . Chronic kidney disease   . Depression   . GERD (gastroesophageal reflux disease)   . Hypercholesterolemia   . Hypertension   . Sleep apnea   . Type 2 diabetes mellitus ([place])      FAMILY HISTORY   Family History  Problem Relation Age of Onset  . Hypertension Mother   . Mental illness Mother   . Diabetes Mother   . Hypertension Father   . Lung cancer Father   . Diabetes Paternal Grandmother   . Colon cancer Maternal Uncle   . Esophageal cancer Neg Hx   . Liver cancer Neg Hx   . Pancreatic cancer Neg Hx   . Rectal cancer Neg Hx   . Stomach cancer Neg Hx     SOCIAL HISTORY:   Social History   Socioeconomic History  . Marital status: Divorced    Spouse name: Not on file  . Number of children: Not on file  . Years of education: Not on file  . Highest education level: Not on file  Occupational History  . Not on file  Social Needs  . Financial resource strain: Not on file  . Food insecurity:    Worry: Not on file     Inability: Not on file  . Transportation needs:    Medical: Not on file    Non-medical: Not on file  Tobacco Use  . Smoking status: Former Smoker    Packs/day: 0.50    Years: 41.00    Pack years: 20.50    Types: Cigarettes  . Smokeless tobacco: Never Used  . Tobacco comment: Quit in Feb.   Substance and Sexual Activity  . Alcohol use: No  . Drug use: No  . Sexual activity: Not on file  Lifestyle  . Physical activity:    Days per week: Not on file    Minutes per session: Not on file  . Stress: Not on file  Relationships  . Social connections:    Talks on phone: Not on file    Gets together: Not on file    Attends religious service: Not on file    Active member of club or organization: Not on file    Attends meetings of clubs or organizations: Not on file    Relationship status: Not on file  . Intimate partner violence:    Fear of current or ex partner: Not on file    Emotionally abused: Not on  file    Physically abused: Not on file    Forced sexual activity: Not on file  Other Topics Concern  . Not on file  Social History Narrative   Single.   1 daughter, 1 grandchild.   Retired. Once worked in [employer].   Enjoys sports, racing, traveling.     ALLERGIES:    No Known Allergies  CURRENT MEDICATIONS:    Current Outpatient Medications  Medication Sig Dispense Refill  . atenolol (TENORMIN) 50 MG tablet Take 1 tablet (50 mg total) daily by mouth. 90 tablet 1  . atorvastatin (LIPITOR) 20 MG tablet Take 1 tablet (20 mg total) by mouth every evening. 90 tablet 3  . diclofenac (VOLTAREN) 75 MG EC tablet Take 75 mg by mouth daily.     Marland Kitchen GARLIC PO Take 1 tablet by mouth daily.     . Ginseng (GIN-ZING PO) Take 1 capsule by mouth daily.     . hydrochlorothiazide (HYDRODIURIL) 25 MG tablet Take 1 tablet (25 mg total) daily by mouth. 90 tablet 1  . lisinopril (PRINIVIL,ZESTRIL) 40 MG tablet Take 1 tablet (40 mg total) by mouth daily. 90 tablet 3  . metFORMIN (GLUCOPHAGE)  1000 MG tablet Take 1 tablet (1,000 mg total) by mouth 2 (two) times daily with a meal. For diabetes. 180 tablet 1  . Multiple Vitamins-Minerals (MULTIVITAMIN ADULT) TABS Take 1 tablet by mouth daily.    . Omega-3 Fatty Acids (OMEGA 3 PO) Take 1 capsule by mouth daily.    . sertraline (ZOLOFT) 50 MG tablet Take 1 tablet (50 mg total) daily by mouth. 90 tablet 0  . Ascorbic Acid (VITAMIN C) 1000 MG tablet Take 1,000 mg by mouth daily.    . fluticasone (FLONASE) 50 MCG/ACT nasal spray Place 2 sprays into both nostrils daily. (Patient not taking: Reported on 02/09/2018) 1 g 0  . methocarbamol (ROBAXIN) 500 MG tablet Take 500 mg by mouth daily as needed for muscle spasms.      Current Facility-Administered Medications  Medication Dose Route Frequency Provider Last Rate Last Dose  . 0.9 %  sodium chloride infusion  500 mL Intravenous Once Ladene Artist, MD        REVIEW OF SYSTEMS:   [X]  denotes positive finding, [ ]  denotes negative finding Cardiac  Comments:  Chest pain or chest pressure:    Shortness of breath upon exertion:    Short of breath when lying flat:    Irregular heart rhythm:        Vascular    Pain in calf, thigh, or hip brought on by ambulation:    Pain in feet at night that wakes you up from your sleep:     Blood clot in your veins:    Leg swelling:  x       Pulmonary    Oxygen at home:    Productive cough:     Wheezing:         Neurologic    Sudden weakness in arms or legs:     Sudden numbness in arms or legs:     Sudden onset of difficulty speaking or slurred speech:    Temporary loss of vision in one eye:     Problems with dizziness:         Gastrointestinal    Blood in stool:      Vomited blood:         Genitourinary    Burning when urinating:     Blood in urine:  Psychiatric    Major depression:         Hematologic    Bleeding problems:    Problems with blood clotting too easily:        Skin    Rashes or ulcers:          Constitutional    Fever or chills:     PHYSICAL EXAM:   Vitals:   02/09/18 1524  BP: 125/69  Pulse: (!) 59  Resp: 16  Temp: (!) 97.1 F (36.2 C)  TempSrc: Oral  SpO2: 95%  Weight: 239 lb (108.4 kg)  Height: 5\' 8"  (1.727 m)    GENERAL: The patient is a well-nourished male, in no acute distress. The vital signs are documented above. CARDIAC: There is a regular rate and rhythm.  VASCULAR: Palpable dorsalis pedis pulse bilaterally.  2+ pitting edema bilaterally. PULMONARY: Nonlabored respirations MUSCULOSKELETAL: There are no major deformities or cyanosis. NEUROLOGIC: No focal weakness or paresthesias are detected. SKIN: There are no ulcers or rashes noted. PSYCHIATRIC: The patient has a normal affect.  STUDIES:   I have reviewed his ultrasound with the following findings: Venous Reflux Times Normal value < 0.5 sec +-----------+----------+---------+       Right (ms)Left (ms) +-----------+----------+---------+ CFV         1093.00  +-----------+----------+---------+ Popliteal      2428.00  +-----------+----------+---------+ GSV at knee     557.00   +-----------+----------+---------+  Vein Diameters: +------------------------------+----------+---------+                Right (cm)Left (cm) +------------------------------+----------+---------+ GSV at Saphenofemoral junction     0.546   +------------------------------+----------+---------+ GSV at prox thigh            0.447   +------------------------------+----------+---------+ GSV at mid thigh            0.361   +------------------------------+----------+---------+ GSV at distal thigh           0.365   +------------------------------+----------+---------+ GSV at knee               0.329   +------------------------------+----------+---------+ GSV prox calf               0.383   +------------------------------+----------+---------+ SSV origin               0.41    +------------------------------+----------+---------+ SSV prox                0.511   +------------------------------+----------+---------+ SSV mid                 0.465   +------------------------------+----------+---------+     Summary: Left: Abnormal reflux times were noted in the common femoral vein, popliteal vein, and great saphenous vein at the knee. There is no evidence of deep vein thrombosis in the lower extremity.There is no evidence of superficial venous thrombosis.  ASSESSMENT and PLAN   Leg swelling: The patient has a combination of superficial and deep venous reflux.  The superficial reflux would not benefit from treatment.  I think the best course of action is to place him and compression.  I have recommended 20-30 thigh-high compression stockings.  He has previously been wearing the knee-high but has found him to be uncomfortable.  I think this is important for him to prevent ulcerations in the future.  He understands that he should wear them as many days as possible and that he does not need to wear them at night.  He will also try to keep his legs elevated when  possible.   Annamarie Major, MD [facility] [phone] Pager [phone]

## 2018-02-12 ENCOUNTER — Telehealth: Payer: Self-pay

## 2018-02-12 ENCOUNTER — Ambulatory Visit (INDEPENDENT_AMBULATORY_CARE_PROVIDER_SITE_OTHER): Payer: Medicare Other

## 2018-02-12 DIAGNOSIS — E538 Deficiency of other specified B group vitamins: Secondary | ICD-10-CM

## 2018-02-12 MED ORDER — CYANOCOBALAMIN 1000 MCG/ML IJ SOLN
1000.0000 ug | Freq: Once | INTRAMUSCULAR | Status: AC
  Administered 2018-02-12: 1000 ug via INTRAMUSCULAR

## 2018-02-12 NOTE — Telephone Encounter (Signed)
Patient reported on 02/11/2018 he had an episode of severe diarrhea due to an increased dosage of Metformin.

## 2018-02-12 NOTE — Progress Notes (Signed)
Patient in office today for B12 injection. Tolerated injection well in left deltoid.

## 2018-02-13 NOTE — Telephone Encounter (Signed)
Spoken and notified patient of Kate Clark's comments. Patient verbalized understanding.  

## 2018-02-13 NOTE — Telephone Encounter (Signed)
Message left for patient to return my call.  

## 2018-02-13 NOTE — Telephone Encounter (Signed)
Please thank him for the update. Have him try 1000 mg in the morning and 500 mg in the evening for a few weeks then increase up to 1000 mg BID. Will you also have him update [place] next week?

## 2018-02-13 NOTE — Telephone Encounter (Signed)
Pt returning your call

## 2018-03-16 ENCOUNTER — Other Ambulatory Visit: Payer: Self-pay

## 2018-03-16 NOTE — Patient Outreach (Signed)
[facility]) Care Management  03/16/2018  YOUSIF [name] [birth year] [id number]   Medication Adherence  call to Mr. Kenneth [name] spoke with patient he still has medication and does not need any at this time patient said he was taking off and was put back patient is due on Lisinopril 40 mg and Atorvastatin 20 mg he is trying  natural remedies. Kenneth [name] is showing past due under [place].   [facility] Direct Dial [phone]  Fax [phone] [email or web address]

## 2018-04-07 ENCOUNTER — Ambulatory Visit (INDEPENDENT_AMBULATORY_CARE_PROVIDER_SITE_OTHER): Payer: Medicare HMO | Admitting: Family Medicine

## 2018-04-07 ENCOUNTER — Encounter: Payer: Self-pay | Admitting: Family Medicine

## 2018-04-07 DIAGNOSIS — J3489 Other specified disorders of nose and nasal sinuses: Secondary | ICD-10-CM | POA: Diagnosis not present

## 2018-04-07 MED ORDER — FLUTICASONE PROPIONATE 50 MCG/ACT NA SUSP: 2.0000 | Freq: Every day | NASAL | 3 refills | Status: DC

## 2018-04-07 NOTE — Patient Instructions (Signed)
Try using flonase for now and update [place] as needed.   Take care.  Glad to see you.

## 2018-04-07 NOTE — Progress Notes (Signed)
duration of symptoms: started about 1 month ago, sx wax and wane rhinorrhea:yes congestion:yes ear pain:no sore throat:no cough:no Myalgias:no No fevers.  Last A1c 7.2   He is going to work on quitting smoking again.   He wasn't able to get flonase prev.    He has some episodic numbness in the left foot is noted more when he is working on hard floors.  He does not notice symptoms when he is on soft turf.  No motor deficit.  Per HPI unless specifically indicated in ROS section   Meds, vitals, and allergies reviewed.   GEN: nad, alert and oriented HEENT: mucous membranes moist, TM w/o erythema, nasal epithelium injected, OP with cobblestoning NECK: supple w/o LA CV: rrr.  Faint systolic ejection murmur noted. PULM: ctab, no inc wob ABD: soft, +bs EXT: L>R BLE edema at baseline.   Sinuses not tender to palpation.

## 2018-04-08 DIAGNOSIS — J3489 Other specified disorders of nose and nasal sinuses: Secondary | ICD-10-CM | POA: Insufficient documentation

## 2018-04-08 NOTE — Assessment & Plan Note (Signed)
His sinuses are not tender and he was unable to get Flonase previously.  It may be cheaper as a prescription.  Prescription sent.  Discussed use.  Follow-up as needed.  Likely would not benefit from antibiotics at this point.  It may be that he is having mechanical symptoms/peripheral nerve compression on a hard floor that contributes to the numbness in his foot.  I will notify PCP about this.  Is not a new issue that is emergent.  I did notice a faint systolic murmur.  He had an echo done back in 2018.  If the murmur persists he may need follow-up echo.  I will defer to PCP.  I appreciate the help of all involved.

## 2018-04-16 ENCOUNTER — Ambulatory Visit: Payer: Medicare Other

## 2018-04-16 ENCOUNTER — Ambulatory Visit: Payer: PPO

## 2018-04-17 ENCOUNTER — Encounter: Payer: Self-pay | Admitting: Primary Care

## 2018-04-17 ENCOUNTER — Ambulatory Visit (INDEPENDENT_AMBULATORY_CARE_PROVIDER_SITE_OTHER): Payer: Medicare HMO | Admitting: Primary Care

## 2018-04-17 VITALS — BP 126/78 | HR 64 | Temp 97.9°F | Ht 68.0 in | Wt 235.2 lb

## 2018-04-17 DIAGNOSIS — E538 Deficiency of other specified B group vitamins: Secondary | ICD-10-CM | POA: Diagnosis not present

## 2018-04-17 DIAGNOSIS — R200 Anesthesia of skin: Secondary | ICD-10-CM | POA: Diagnosis not present

## 2018-04-17 LAB — CBC
HCT: 43.5 % (ref 39.0–52.0)
Hemoglobin: 15 g/dL (ref 13.0–17.0)
MCHC: 34.5 g/dL (ref 30.0–36.0)
MCV: 98.2 fl (ref 78.0–100.0)
Platelets: 409 10*3/uL — ABNORMAL HIGH (ref 150.0–400.0)
RBC: 4.43 Mil/uL (ref 4.22–5.81)
RDW: 13 % (ref 11.5–15.5)
WBC: 15.4 10*3/uL — ABNORMAL HIGH (ref 4.0–10.5)

## 2018-04-17 LAB — BASIC METABOLIC PANEL
BUN: 21 mg/dL (ref 6–23)
CO2: 27 mEq/L (ref 19–32)
Calcium: 9.4 mg/dL (ref 8.4–10.5)
Chloride: 103 mEq/L (ref 96–112)
Creatinine, Ser: 0.91 mg/dL (ref 0.40–1.50)
GFR: 84.33 mL/min (ref >60.00)
Glucose, Bld: 151 mg/dL — ABNORMAL HIGH (ref 70–99)
Potassium: 3.7 mEq/L (ref 3.5–5.1)
Sodium: 138 mEq/L (ref 135–145)

## 2018-04-17 LAB — VITAMIN B12: Vitamin B-12: 189 pg/mL — ABNORMAL LOW (ref 211–911)

## 2018-04-17 LAB — TSH: TSH: 0.89 u[IU]/mL (ref 0.35–4.50)

## 2018-04-17 NOTE — Assessment & Plan Note (Signed)
Non compliant to monthly vaccinations as recommended. Repeat B12 pending. Will wait labs and re-assess.

## 2018-04-17 NOTE — Patient Instructions (Signed)
Stop by the lab prior to leaving today. I will notify you of your results once received.   Follow up with Katha Cabal and myself as scheduled.  It was a pleasure to see you today!

## 2018-04-17 NOTE — Progress Notes (Signed)
Subjective:    Patient ID: Kenneth [name], male    DOB: 1955-06-10, 63 y.o.   MRN: [id number]  HPI  Kenneth [name] is a 63 year old male with a history of diabetes, hyperlipidemia, chronic neck pain, vitamin B12 deficiency, chronic lower extremity edema who presents today with a chief complaint of numbness.  BP Readings from Last 3 Encounters:  04/17/18 126/78  04/07/18 130/80  02/09/18 125/69   Hi last B12 level was 154, last B12 injection was in November 2019. He was evaluated by vascular surgery in November 2019 with evidence of superficial and deep venous reflux. It was recommended he use 20-30 thigh high compression stockings and elevate his legs at night. Last A1C of 7.2 in November 2019.  He is wearing his thigh high stockings five days weekly on average. He endorses intermittent bilateral lower extremity numbness with weakness which occurs when walking or standing for several minutes on hard surfaces. He doesn't have this occur when he's walking on grass or around in his yard, only when walking on concrete or hard floors. His symptoms begin to the mid calf up to the mid thigh. He denies pain. This began about one month ago.  He wears soft boots, tennis shoes  Review of Systems  Respiratory: Negative for shortness of breath.   Cardiovascular: Negative for chest pain.       Intermittent lower extremity edema  Neurological: Positive for numbness. Negative for dizziness.       Past Medical History:  Diagnosis Date  . Anxiety and depression   . Bipolar 1 disorder ([place])   . Blood transfusion without reported diagnosis   . Chronic kidney disease   . Depression   . GERD (gastroesophageal reflux disease)   . Hypercholesterolemia   . Hypertension   . Sleep apnea   . Type 2 diabetes mellitus ([place])      Social History   Socioeconomic History  . Marital status: Divorced    Spouse name: Not on file  . Number of children: Not on file  . Years of education: Not on file  .  Highest education level: Not on file  Occupational History  . Not on file  Social Needs  . Financial resource strain: Not on file  . Food insecurity:    Worry: Not on file    Inability: Not on file  . Transportation needs:    Medical: Not on file    Non-medical: Not on file  Tobacco Use  . Smoking status: Former Smoker    Packs/day: 0.50    Years: 41.00    Pack years: 20.50    Types: Cigarettes  . Smokeless tobacco: Never Used  . Tobacco comment: Quit in Feb.   Substance and Sexual Activity  . Alcohol use: No  . Drug use: No  . Sexual activity: Not on file  Lifestyle  . Physical activity:    Days per week: Not on file    Minutes per session: Not on file  . Stress: Not on file  Relationships  . Social connections:    Talks on phone: Not on file    Gets together: Not on file    Attends religious service: Not on file    Active member of club or organization: Not on file    Attends meetings of clubs or organizations: Not on file    Relationship status: Not on file  . Intimate partner violence:    Fear of current or ex partner: Not on  file    Emotionally abused: Not on file    Physically abused: Not on file    Forced sexual activity: Not on file  Other Topics Concern  . Not on file  Social History Narrative   Single.   1 daughter, 1 grandchild.   Retired. Once worked in [employer].   Enjoys sports, racing, traveling.     Past Surgical History:  Procedure Laterality Date  . L hand surgery    . R knee arthoroscopy    . SPLENECTOMY      Family History  Problem Relation Age of Onset  . Hypertension Mother   . Mental illness Mother   . Diabetes Mother   . Hypertension Father   . Lung cancer Father   . Diabetes Paternal Grandmother   . Colon cancer Maternal Uncle   . Esophageal cancer Neg Hx   . Liver cancer Neg Hx   . Pancreatic cancer Neg Hx   . Rectal cancer Neg Hx   . Stomach cancer Neg Hx     No Known Allergies  Current Outpatient Medications on File  Prior to Visit  Medication Sig Dispense Refill  . Ascorbic Acid (VITAMIN C) 1000 MG tablet Take 1,000 mg by mouth daily.    Marland Kitchen atenolol (TENORMIN) 50 MG tablet Take 1 tablet (50 mg total) daily by mouth. 90 tablet 1  . atorvastatin (LIPITOR) 20 MG tablet Take 1 tablet (20 mg total) by mouth every evening. 90 tablet 3  . diclofenac (VOLTAREN) 75 MG EC tablet Take 75 mg by mouth daily.     . fluticasone (FLONASE) 50 MCG/ACT nasal spray Place 2 sprays into both nostrils daily. 1 g 3  . GARLIC PO Take 1 tablet by mouth daily.     . Ginseng (GIN-ZING PO) Take 1 capsule by mouth daily.     . hydrochlorothiazide (HYDRODIURIL) 25 MG tablet Take 1 tablet (25 mg total) daily by mouth. 90 tablet 1  . lisinopril (PRINIVIL,ZESTRIL) 40 MG tablet Take 1 tablet (40 mg total) by mouth daily. 90 tablet 3  . metFORMIN (GLUCOPHAGE) 1000 MG tablet Take 1 tablet (1,000 mg total) by mouth 2 (two) times daily with a meal. For diabetes. 180 tablet 1  . methocarbamol (ROBAXIN) 500 MG tablet Take 500 mg by mouth daily as needed for muscle spasms.     . Multiple Vitamins-Minerals (MULTIVITAMIN ADULT) TABS Take 1 tablet by mouth daily.    . Omega-3 Fatty Acids (OMEGA 3 PO) Take 1 capsule by mouth daily.    . sertraline (ZOLOFT) 50 MG tablet Take 1 tablet (50 mg total) daily by mouth. 90 tablet 0   Current Facility-Administered Medications on File Prior to Visit  Medication Dose Route Frequency Provider Last Rate Last Dose  . 0.9 %  sodium chloride infusion  500 mL Intravenous Once Ladene Artist, MD        BP 126/78   Pulse 64   Temp 97.9 F (36.6 C) (Oral)   Ht 5\' 8"  (1.727 m)   Wt 235 lb 4 oz (106.7 kg)   SpO2 95%   BMI 35.77 kg/m    Objective:   Physical Exam  Constitutional: He appears well-nourished.  Neck: Neck supple.  Cardiovascular: Normal rate and regular rhythm.  Murmur heard. Pulses:      Dorsalis pedis pulses are 2+ on the right side and 2+ on the left side.       Posterior tibial pulses  are 2+ on the right side and  2+ on the left side.  Respiratory: Effort normal and breath sounds normal.  Neurological:  Reflex Scores:      Patellar reflexes are 2+ on the right side and 2+ on the left side. Skin: Skin is warm and dry.  Psychiatric: He has a normal mood and affect.           Assessment & Plan:

## 2018-04-17 NOTE — Assessment & Plan Note (Signed)
Intermittent when walking on hard surfaces for the last one month. No problem with softer surfaces. Compliant to compression stockings. Exam today overall unremarkable. Evaluated by vascular who has confirmed a vascular problem, also noted B12 deficiency. Check labs today including CBC, B12, BMP, TSH. Discussed to wear more comfortable shoes, also with insoles to shoes.

## 2018-04-20 ENCOUNTER — Other Ambulatory Visit: Payer: Self-pay | Admitting: Primary Care

## 2018-04-20 DIAGNOSIS — R7989 Other specified abnormal findings of blood chemistry: Secondary | ICD-10-CM

## 2018-04-22 ENCOUNTER — Telehealth: Payer: Self-pay | Admitting: Primary Care

## 2018-04-22 NOTE — Telephone Encounter (Signed)
Pt returned call to [facility] to get labs results.

## 2018-04-22 NOTE — Telephone Encounter (Signed)
Spoken and notified patient of Kate Clark's comments. Patient verbalized understanding.  

## 2018-04-26 ENCOUNTER — Other Ambulatory Visit: Payer: Self-pay | Admitting: Primary Care

## 2018-04-26 DIAGNOSIS — E119 Type 2 diabetes mellitus without complications: Secondary | ICD-10-CM

## 2018-04-26 DIAGNOSIS — Z125 Encounter for screening for malignant neoplasm of prostate: Secondary | ICD-10-CM

## 2018-04-26 DIAGNOSIS — E785 Hyperlipidemia, unspecified: Secondary | ICD-10-CM

## 2018-04-27 ENCOUNTER — Encounter: Payer: PPO | Admitting: Primary Care

## 2018-04-27 ENCOUNTER — Encounter: Payer: Medicare Other | Admitting: Primary Care

## 2018-04-29 ENCOUNTER — Ambulatory Visit: Payer: Medicare HMO

## 2018-04-30 ENCOUNTER — Ambulatory Visit (INDEPENDENT_AMBULATORY_CARE_PROVIDER_SITE_OTHER): Payer: Medicare HMO

## 2018-04-30 VITALS — BP 118/64 | HR 55 | Temp 98.2°F | Ht 67.5 in | Wt 236.5 lb

## 2018-04-30 DIAGNOSIS — E538 Deficiency of other specified B group vitamins: Secondary | ICD-10-CM

## 2018-04-30 DIAGNOSIS — Z125 Encounter for screening for malignant neoplasm of prostate: Secondary | ICD-10-CM | POA: Diagnosis not present

## 2018-04-30 DIAGNOSIS — Z0000 Encounter for general adult medical examination without abnormal findings: Secondary | ICD-10-CM | POA: Diagnosis not present

## 2018-04-30 DIAGNOSIS — E119 Type 2 diabetes mellitus without complications: Secondary | ICD-10-CM | POA: Diagnosis not present

## 2018-04-30 DIAGNOSIS — E785 Hyperlipidemia, unspecified: Secondary | ICD-10-CM | POA: Diagnosis not present

## 2018-04-30 DIAGNOSIS — R7989 Other specified abnormal findings of blood chemistry: Secondary | ICD-10-CM | POA: Diagnosis not present

## 2018-04-30 LAB — HEPATIC FUNCTION PANEL
ALT: 25 U/L (ref 0–53)
AST: 15 U/L (ref 0–37)
Albumin: 4 g/dL (ref 3.5–5.2)
Alkaline Phosphatase: 81 U/L (ref 39–117)
Bilirubin, Direct: 0.1 mg/dL (ref 0.0–0.3)
Total Bilirubin: 0.4 mg/dL (ref 0.2–1.2)
Total Protein: 6.6 g/dL (ref 6.0–8.3)

## 2018-04-30 LAB — CBC WITH DIFFERENTIAL/PLATELET
Basophils Absolute: 0.2 10*3/uL — ABNORMAL HIGH (ref 0.0–0.1)
Basophils Relative: 1.1 % (ref 0.0–3.0)
Eosinophils Absolute: 0.6 10*3/uL (ref 0.0–0.7)
Eosinophils Relative: 3.9 % (ref 0.0–5.0)
HCT: 44.8 % (ref 39.0–52.0)
Hemoglobin: 15.2 g/dL (ref 13.0–17.0)
Lymphocytes Relative: 33 % (ref 12.0–46.0)
Lymphs Abs: 4.8 10*3/uL — ABNORMAL HIGH (ref 0.7–4.0)
MCHC: 33.8 g/dL (ref 30.0–36.0)
MCV: 98.6 fl (ref 78.0–100.0)
Monocytes Absolute: 1.4 10*3/uL — ABNORMAL HIGH (ref 0.1–1.0)
Monocytes Relative: 9.9 % (ref 3.0–12.0)
Neutro Abs: 7.5 10*3/uL (ref 1.4–7.7)
Neutrophils Relative %: 52.1 % (ref 43.0–77.0)
Platelets: 478 10*3/uL — ABNORMAL HIGH (ref 150.0–400.0)
RBC: 4.54 Mil/uL (ref 4.22–5.81)
RDW: 13.1 % (ref 11.5–15.5)
WBC: 14.4 10*3/uL — ABNORMAL HIGH (ref 4.0–10.5)

## 2018-04-30 LAB — PSA: PSA: 1.31 ng/mL (ref 0.10–4.00)

## 2018-04-30 LAB — HEMOGLOBIN A1C: Hgb A1c MFr Bld: 6.4 % (ref 4.6–6.5)

## 2018-04-30 MED ORDER — CYANOCOBALAMIN 1000 MCG/ML IJ SOLN
1000.0000 ug | Freq: Once | INTRAMUSCULAR | Status: AC
  Administered 2018-04-30: 1000 ug via INTRAMUSCULAR

## 2018-04-30 NOTE — Progress Notes (Signed)
Subjective:   Kenneth [name] is a 63 y.o. male who presents for Medicare Annual/Subsequent preventive examination.  Review of Systems:  N/A Cardiac Risk Factors include: advanced age (>66men, >49 women);male gender;dyslipidemia;hypertension;obesity (BMI >30kg/m2);smoking/ tobacco exposure     Objective:    Vitals: BP 118/64 (BP Location: Right Arm, Patient Position: Sitting, Cuff Size: Normal)   Pulse (!) 55   Temp 98.2 F (36.8 C) (Oral)   Ht 5' 7.5" (1.715 m) Comment: shoes  Wt 236 lb 8 oz (107.3 kg)   SpO2 92%   BMI 36.49 kg/m   Body mass index is 36.49 kg/m.  Advanced Directives 01/19/2018 04/15/2017 08/02/2016 06/15/2016 03/11/2016 04/26/2014  Does Patient Have a Medical Advance Directive? No No No No No No  Would patient like information on creating a medical advance directive? No - Patient declined Yes (MAU/Ambulatory/Procedural Areas - Information given) No - Patient declined - Yes (Inpatient - patient requests chaplain consult to create a medical advance directive);No - Patient declined No - patient declined information    Tobacco Social History   Tobacco Use  Smoking Status Former Smoker  . Packs/day: 0.50  . Years: 41.00  . Pack years: 20.50  . Types: Cigarettes  Smokeless Tobacco Never Used  Tobacco Comment   Quit in Feb.      Counseling given: No Comment: Quit in Feb.    Clinical Intake:  Pre-visit preparation completed: Yes  Pain : No/denies pain Pain Score: 0-No pain     Nutritional Status: BMI > 30  Obese Nutritional Risks: None Diabetes: No  How often do you need to have someone help you when you read instructions, pamphlets, or other written materials from your doctor or pharmacy?: 1 - Never What is the last grade level you completed in school?: 12th grade  Interpreter Needed?: No  Comments: pt lives with significant others Information entered by :: LPinson, LPN  Past Medical History:  Diagnosis Date  . Anxiety and depression   .  Bipolar 1 disorder ([place])   . Blood transfusion without reported diagnosis   . Chronic kidney disease   . Depression   . GERD (gastroesophageal reflux disease)   . Hypercholesterolemia   . Hypertension   . Sleep apnea   . Type 2 diabetes mellitus ([place])    Past Surgical History:  Procedure Laterality Date  . L hand surgery    . R knee arthoroscopy    . SPLENECTOMY     Family History  Problem Relation Age of Onset  . Hypertension Mother   . Mental illness Mother   . Diabetes Mother   . Hypertension Father   . Lung cancer Father   . Diabetes Paternal Grandmother   . Colon cancer Maternal Uncle   . Esophageal cancer Neg Hx   . Liver cancer Neg Hx   . Pancreatic cancer Neg Hx   . Rectal cancer Neg Hx   . Stomach cancer Neg Hx    Social History   Socioeconomic History  . Marital status: Divorced    Spouse name: Not on file  . Number of children: Not on file  . Years of education: Not on file  . Highest education level: Not on file  Occupational History  . Not on file  Social Needs  . Financial resource strain: Not on file  . Food insecurity:    Worry: Not on file    Inability: Not on file  . Transportation needs:    Medical: Not on file  Non-medical: Not on file  Tobacco Use  . Smoking status: Former Smoker    Packs/day: 0.50    Years: 41.00    Pack years: 20.50    Types: Cigarettes  . Smokeless tobacco: Never Used  . Tobacco comment: Quit in Feb.   Substance and Sexual Activity  . Alcohol use: No  . Drug use: No  . Sexual activity: Not on file  Lifestyle  . Physical activity:    Days per week: Not on file    Minutes per session: Not on file  . Stress: Not on file  Relationships  . Social connections:    Talks on phone: Not on file    Gets together: Not on file    Attends religious service: Not on file    Active member of club or organization: Not on file    Attends meetings of clubs or organizations: Not on file    Relationship status: Not on file    Other Topics Concern  . Not on file  Social History Narrative   Single.   1 daughter, 1 grandchild.   Retired. Once worked in [employer].   Enjoys sports, racing, traveling.     Outpatient Encounter Medications as of 04/30/2018  Medication Sig  . Ascorbic Acid (VITAMIN C) 1000 MG tablet Take 1,000 mg by mouth daily.  Marland Kitchen atenolol (TENORMIN) 50 MG tablet Take 1 tablet (50 mg total) daily by mouth.  Marland Kitchen atorvastatin (LIPITOR) 20 MG tablet Take 1 tablet (20 mg total) by mouth every evening.  . Cyanocobalamin (VITAMIN B-12 PO) Take 1 tablet by mouth daily.  . diclofenac (VOLTAREN) 75 MG EC tablet Take 75 mg by mouth daily.   . fluticasone (FLONASE) 50 MCG/ACT nasal spray Place 2 sprays into both nostrils daily.  Marland Kitchen GARLIC PO Take 1 tablet by mouth daily.   . Ginseng (GIN-ZING PO) Take 1 capsule by mouth daily.   . hydrochlorothiazide (HYDRODIURIL) 25 MG tablet Take 1 tablet (25 mg total) daily by mouth.  Marland Kitchen lisinopril (PRINIVIL,ZESTRIL) 40 MG tablet Take 1 tablet (40 mg total) by mouth daily.  . metFORMIN (GLUCOPHAGE) 1000 MG tablet Take 1 tablet (1,000 mg total) by mouth 2 (two) times daily with a meal. For diabetes.  . methocarbamol (ROBAXIN) 500 MG tablet Take 500 mg by mouth daily as needed for muscle spasms.   . Multiple Vitamins-Minerals (MULTIVITAMIN ADULT) TABS Take 1 tablet by mouth daily.  . Omega-3 Fatty Acids (OMEGA 3 PO) Take 1 capsule by mouth daily.  . sertraline (ZOLOFT) 50 MG tablet Take 1 tablet (50 mg total) daily by mouth.   Facility-Administered Encounter Medications as of 04/30/2018  Medication  . 0.9 %  sodium chloride infusion  . [COMPLETED] cyanocobalamin ((VITAMIN B-12)) injection 1,000 mcg    Activities of Daily Living In your present state of health, do you have any difficulty performing the following activities: 04/30/2018  Hearing? N  Vision? N  Difficulty concentrating or making decisions? N  Walking or climbing stairs? N  Dressing or bathing? N  Doing errands,  shopping? N  Preparing Food and eating ? N  Using the Toilet? N  In the past six months, have you accidently leaked urine? N  Do you have problems with loss of bowel control? N  Managing your Medications? N  Managing your Finances? N  Housekeeping or managing your Housekeeping? N  Some recent data might be hidden    Patient Care Team: Pleas Koch, NP as PCP - General (Internal Medicine)  Unk Pinto, MD as Attending Physician (Internal Medicine)   Assessment:   This is a routine wellness examination for [place].    Hearing Screening   125Hz  250Hz  500Hz  1000Hz  2000Hz  3000Hz  4000Hz  6000Hz  8000Hz   Right ear:   40 40 40  40    Left ear:   40 40 40  40    Vision Screening Comments: Vision exam in May 2019 with Dr. Orvan Seen   Exercise Activities and Dietary recommendations Current Exercise Habits: The patient does not participate in regular exercise at present, Exercise limited by: None identified  Goals    . Follow up with Primary Care Provider     Starting 04/30/2018, I will continue to take medications as prescribed and to keep appointments with PCP as scheduled.        Fall Risk Fall Risk  04/30/2018 04/15/2017  Falls in the past year? 0 No    Depression Screen PHQ 2/9 Scores 04/30/2018 04/15/2017  PHQ - 2 Score 0 0  PHQ- 9 Score 0 3    Cognitive Function MMSE - Mini Mental State Exam 04/30/2018 04/15/2017  Orientation to time 5 5  Orientation to Place 5 5  Registration 3 3  Attention/ Calculation 0 0  Recall 3 3  Language- name 2 objects 0 0  Language- repeat 1 1  Language- follow 3 step command 3 3  Language- read & follow direction 0 0  Write a sentence 0 0  Copy design 0 0  Total score 20 20   PLEASE NOTE: A Mini-Cog screen was completed. Maximum score is 20. A value of 0 denotes this part of [place] MMSE was not completed or the patient failed this part of the Mini-Cog screening.   Mini-Cog Screening Orientation to Time - Max 5 pts Orientation to  Place - Max 5 pts Registration - Max 3 pts Recall - Max 3 pts Language Repeat - Max 1 pts Language Follow 3 Step Command - Max 3 pts     Immunization History  Administered Date(s) Administered  . Influenza,inj,Quad PF,6+ Mos 04/24/2017, 02/04/2018  . Tdap 10/08/2013    Screening Tests Health Maintenance  Topic Date Due  . PNEUMOCOCCAL POLYSACCHARIDE VACCINE AGE 63-64 HIGH RISK  03/31/2019 (Originally 12/16/1957)  . FOOT EXAM  07/29/2018  . OPHTHALMOLOGY EXAM  08/20/2018  . HEMOGLOBIN A1C  10/29/2018  . TETANUS/TDAP  10/09/2023  . COLONOSCOPY  06/24/2027  . INFLUENZA VACCINE  Completed  . Hepatitis C Screening  Completed  . HIV Screening  Completed       Plan:     I have personally reviewed, addressed, and noted the following in the patient's chart:  A. Medical and social history B. Use of alcohol, tobacco or illicit drugs  C. Current medications and supplements D. Functional ability and status E.  Nutritional status F.  Physical activity G. Advance directives H. List of other physicians I.  Hospitalizations, surgeries, and ER visits in previous 12 months J.  [place] to include hearing, vision, cognitive, depression L. Referrals and appointments - none  In addition, I have reviewed and discussed with patient certain preventive protocols, quality metrics, and best practice recommendations. A written personalized care plan for preventive services as well as general preventive health recommendations were provided to patient.  See attached scanned questionnaire for additional information.   Signed,   Lindell Noe, MHA, BS, LPN Health Coach

## 2018-04-30 NOTE — Patient Instructions (Signed)
Kenneth [name] , Thank you for taking time to come for your Medicare Wellness Visit. I appreciate your ongoing commitment to your health goals. Please review the following plan we discussed and let me know if I can assist you in the future.   These are the goals we discussed: Goals    . Follow up with Primary Care Provider     Starting 04/30/2018, I will continue to take medications as prescribed and to keep appointments with PCP as scheduled.        This is a list of the screening recommended for you and due dates:  Health Maintenance  Topic Date Due  . Pneumococcal vaccine  03/31/2019*  . Complete foot exam   07/29/2018  . Eye exam for diabetics  08/20/2018  . Hemoglobin A1C  10/29/2018  . Tetanus Vaccine  10/09/2023  . Colon Cancer Screening  06/24/2027  . Flu Shot  Completed  .  Hepatitis C: One time screening is recommended by Center for Disease Control  (CDC) for  adults born from [birth year] through 1965.   Completed  . HIV Screening  Completed  *Topic was postponed. The date shown is not the original due date.   Preventive Care for Adults  A healthy lifestyle and preventive care can promote health and wellness. Preventive health guidelines for adults include the following key practices.  . A routine yearly physical is a good way to check with your health care provider about your health and preventive screening. It is a chance to share any concerns and updates on your health and to receive a thorough exam.  . Visit your dentist for a routine exam and preventive care every 6 months. Brush your teeth twice a day and floss once a day. Good oral hygiene prevents tooth decay and gum disease.  . The frequency of eye exams is based on your age, health, family medical history, use  of contact lenses, and other factors. Follow your health care provider's recommendations for frequency of eye exams.  . Eat a healthy diet. Foods like vegetables, fruits, whole grains, low-fat dairy products, and  lean protein foods contain the nutrients you need without too many calories. Decrease your intake of foods high in solid fats, added sugars, and salt. Eat the right amount of calories for you. Get information about a proper diet from your health care provider, if necessary.  . Regular physical exercise is one of the most important things you can do for your health. Most adults should get at least 150 minutes of moderate-intensity exercise (any activity that increases your heart rate and causes you to sweat) each week. In addition, most adults need muscle-strengthening exercises on 2 or more days a week.  Silver Sneakers may be a benefit available to you. To determine eligibility, you may visit the website: www.silversneakers.com or contact program at 870-760-1161 Mon-Fri between 8AM-8PM.   . Maintain a healthy weight. The body mass index (BMI) is a screening tool to identify possible weight problems. It provides an estimate of body fat based on height and weight. Your health care provider can find your BMI and can help you achieve or maintain a healthy weight.   For adults 20 years and older: ? A BMI below 18.5 is considered underweight. ? A BMI of 18.5 to 24.9 is normal. ? A BMI of 25 to 29.9 is considered overweight. ? A BMI of 30 and above is considered obese.   . Maintain normal blood lipids and cholesterol levels by exercising  and minimizing your intake of saturated fat. Eat a balanced diet with plenty of fruit and vegetables. Blood tests for lipids and cholesterol should begin at age 74 and be repeated every 5 years. If your lipid or cholesterol levels are high, you are over 50, or you are at high risk for heart disease, you may need your cholesterol levels checked more frequently. Ongoing high lipid and cholesterol levels should be treated with medicines if diet and exercise are not working.  . If you smoke, find out from your health care provider how to quit. If you do not use tobacco,  please do not start.  . If you choose to drink alcohol, please do not consume more than 2 drinks per day. One drink is considered to be 12 ounces (355 mL) of beer, 5 ounces (148 mL) of wine, or 1.5 ounces (44 mL) of liquor.  . If you are 69-64 years old, ask your health care provider if you should take aspirin to prevent strokes.  . Use sunscreen. Apply sunscreen liberally and repeatedly throughout the day. You should seek shade when your shadow is shorter than you. Protect yourself by wearing long sleeves, pants, a wide-brimmed hat, and sunglasses year round, whenever you are outdoors.  . Once a month, do a whole body skin exam, using a mirror to look at the skin on your back. Tell your health care provider of new moles, moles that have irregular borders, moles that are larger than a pencil eraser, or moles that have changed in shape or color.

## 2018-04-30 NOTE — Progress Notes (Signed)
Per orders of NP Alma Friendly, injection of B12 given by Lindell Noe. Patient tolerated injection well.

## 2018-04-30 NOTE — Progress Notes (Signed)
PCP notes:   Health maintenance:  PNA vaccine - postponed/pt is ill today A1C - completed  Abnormal screenings:   None  Patient concerns:   None  Nurse concerns:  None  Next PCP appt:   05/11/18 @ 1500

## 2018-05-03 NOTE — Progress Notes (Signed)
I reviewed health advisor's note, was available for consultation, and agree with documentation and plan.  

## 2018-05-07 ENCOUNTER — Telehealth: Payer: Self-pay | Admitting: Primary Care

## 2018-05-07 DIAGNOSIS — F419 Anxiety disorder, unspecified: Principal | ICD-10-CM

## 2018-05-07 DIAGNOSIS — I10 Essential (primary) hypertension: Secondary | ICD-10-CM

## 2018-05-07 DIAGNOSIS — E119 Type 2 diabetes mellitus without complications: Secondary | ICD-10-CM

## 2018-05-07 DIAGNOSIS — F329 Major depressive disorder, single episode, unspecified: Secondary | ICD-10-CM

## 2018-05-07 DIAGNOSIS — F32A Depression, unspecified: Secondary | ICD-10-CM

## 2018-05-07 DIAGNOSIS — E785 Hyperlipidemia, unspecified: Secondary | ICD-10-CM

## 2018-05-07 NOTE — Telephone Encounter (Signed)
Pt has changed his pharmacy to [facility] and need his medications switch and new prescription. Please advise.   Sertraline  50 mg  Atenolol   50mg    Lisinopril   40 mg  Hydrochlorothiazide  Atorvastatin      Metformin 1000mg 

## 2018-05-08 MED ORDER — HYDROCHLOROTHIAZIDE 25 MG PO TABS: 25.0000 mg | ORAL_TABLET | Freq: Every day | ORAL | 1 refills | Status: DC

## 2018-05-08 MED ORDER — METFORMIN HCL 1000 MG PO TABS: 1000.0000 mg | ORAL_TABLET | Freq: Two times a day (BID) | ORAL | 1 refills | Status: DC

## 2018-05-08 MED ORDER — ATENOLOL 50 MG PO TABS: 50.0000 mg | ORAL_TABLET | Freq: Every day | ORAL | 1 refills | Status: DC

## 2018-05-08 MED ORDER — ATORVASTATIN CALCIUM 20 MG PO TABS: 20.0000 mg | ORAL_TABLET | Freq: Every evening | ORAL | 3 refills | Status: DC

## 2018-05-08 MED ORDER — LISINOPRIL 40 MG PO TABS: 40.0000 mg | ORAL_TABLET | Freq: Every day | ORAL | 3 refills | Status: DC

## 2018-05-08 MED ORDER — SERTRALINE HCL 50 MG PO TABS: 50.0000 mg | ORAL_TABLET | Freq: Every day | ORAL | 0 refills | Status: DC

## 2018-05-08 NOTE — Telephone Encounter (Signed)
Message left for patient to return my call.  

## 2018-05-11 ENCOUNTER — Encounter: Payer: Medicare Other | Admitting: Primary Care

## 2018-05-11 DIAGNOSIS — Z0289 Encounter for other administrative examinations: Secondary | ICD-10-CM

## 2018-05-11 NOTE — Telephone Encounter (Signed)
Patient has appt on 05/11/2018. Will notified.

## 2018-05-12 ENCOUNTER — Encounter: Payer: Medicare HMO | Admitting: Primary Care

## 2018-05-20 ENCOUNTER — Encounter: Payer: Self-pay | Admitting: Primary Care

## 2018-05-20 ENCOUNTER — Ambulatory Visit (INDEPENDENT_AMBULATORY_CARE_PROVIDER_SITE_OTHER): Payer: Medicare HMO | Admitting: Primary Care

## 2018-05-20 VITALS — BP 120/70 | HR 60 | Temp 98.2°F | Ht 67.5 in | Wt 230.5 lb

## 2018-05-20 DIAGNOSIS — R69 Illness, unspecified: Secondary | ICD-10-CM | POA: Diagnosis not present

## 2018-05-20 DIAGNOSIS — Z0000 Encounter for general adult medical examination without abnormal findings: Secondary | ICD-10-CM | POA: Diagnosis not present

## 2018-05-20 DIAGNOSIS — R600 Localized edema: Secondary | ICD-10-CM

## 2018-05-20 DIAGNOSIS — F329 Major depressive disorder, single episode, unspecified: Secondary | ICD-10-CM

## 2018-05-20 DIAGNOSIS — E119 Type 2 diabetes mellitus without complications: Secondary | ICD-10-CM | POA: Diagnosis not present

## 2018-05-20 DIAGNOSIS — F419 Anxiety disorder, unspecified: Secondary | ICD-10-CM

## 2018-05-20 DIAGNOSIS — G4733 Obstructive sleep apnea (adult) (pediatric): Secondary | ICD-10-CM | POA: Diagnosis not present

## 2018-05-20 DIAGNOSIS — E785 Hyperlipidemia, unspecified: Secondary | ICD-10-CM

## 2018-05-20 DIAGNOSIS — F32A Depression, unspecified: Secondary | ICD-10-CM

## 2018-05-20 DIAGNOSIS — E538 Deficiency of other specified B group vitamins: Secondary | ICD-10-CM

## 2018-05-20 DIAGNOSIS — K219 Gastro-esophageal reflux disease without esophagitis: Secondary | ICD-10-CM

## 2018-05-20 DIAGNOSIS — M542 Cervicalgia: Secondary | ICD-10-CM | POA: Diagnosis not present

## 2018-05-20 DIAGNOSIS — I10 Essential (primary) hypertension: Secondary | ICD-10-CM

## 2018-05-20 DIAGNOSIS — Z23 Encounter for immunization: Secondary | ICD-10-CM | POA: Diagnosis not present

## 2018-05-20 DIAGNOSIS — G8929 Other chronic pain: Secondary | ICD-10-CM

## 2018-05-20 MED ORDER — ZOSTER VAC RECOMB ADJUVANTED 50 MCG/0.5ML IM SUSR: 0.5000 mL | Freq: Once | INTRAMUSCULAR | 1 refills | Status: AC

## 2018-05-20 MED ORDER — PNEUMOCOCCAL VAC POLYVALENT 25 MCG/0.5ML IJ INJ: 0.5000 mL | INJECTION | INTRAMUSCULAR | 0 refills | Status: AC

## 2018-05-20 NOTE — Assessment & Plan Note (Signed)
Recent A1C of 6.4 which is stable. Continue Metformin 1000 mg BID. Managed on statin and ACE. Pneumonia vaccination prescription provided today. Discussed to schedule an eye exam.  Follow up in 6 months.

## 2018-05-20 NOTE — Progress Notes (Signed)
Subjective:    Patient ID: Kenneth [name], male    DOB: [birth year], 63 y.o.   MRN: [id number]  HPI  Kenneth [name] is a 63 year old male who presents today for complete physical.  He saw our health advisor last month.  Immunizations: -Tetanus: Completed in 2015 -Influenza: Completed this season  -Pneumonia: Unsure  -Shingles: Never completed    Diet: He endorses a fair diet Breakfast: Fast food, little breakfast  Lunch: Cheese and crackers, fruit, chips, peanut butter Dinner: Salad, fast food, restaurants, sandwiches Snacks: See above Desserts: Daily Beverages: Coffee, juice, soda, water  Exercise: He is not exercising Eye exam: No recent exam Dental exam: Completes annually  Colonoscopy: Completed in 2019 PSA: Completed in 2020 level of 1.31 Hep C Screen: Negative in 2019  BP Readings from Last 3 Encounters:  05/20/18 120/70  04/30/18 118/64  04/17/18 126/78      Review of Systems  Constitutional: Negative for unexpected weight change.  HENT: Negative for rhinorrhea.   Respiratory: Negative for cough and shortness of breath.   Cardiovascular: Negative for chest pain.  Gastrointestinal: Negative for constipation and diarrhea.  Genitourinary: Negative for difficulty urinating.  Musculoskeletal: Negative for arthralgias.  Skin: Negative for rash.  Allergic/Immunologic: Negative for environmental allergies.  Neurological: Negative for dizziness, numbness and headaches.  Psychiatric/Behavioral:       Doing well on Zoloft.       Past Medical History:  Diagnosis Date  . Anxiety and depression   . Bipolar 1 disorder ([place])   . Blood transfusion without reported diagnosis   . Chronic kidney disease   . Depression   . GERD (gastroesophageal reflux disease)   . Hypercholesterolemia   . Hypertension   . Sleep apnea   . Type 2 diabetes mellitus ([place])      Social History   Socioeconomic History  . Marital status: Divorced    Spouse name: Not on file  . Number  of children: Not on file  . Years of education: Not on file  . Highest education level: Not on file  Occupational History  . Not on file  Social Needs  . Financial resource strain: Not on file  . Food insecurity:    Worry: Not on file    Inability: Not on file  . Transportation needs:    Medical: Not on file    Non-medical: Not on file  Tobacco Use  . Smoking status: Former Smoker    Packs/day: 0.50    Years: 41.00    Pack years: 20.50    Types: Cigarettes  . Smokeless tobacco: Never Used  . Tobacco comment: Quit in Feb.   Substance and Sexual Activity  . Alcohol use: No  . Drug use: No  . Sexual activity: Not on file  Lifestyle  . Physical activity:    Days per week: Not on file    Minutes per session: Not on file  . Stress: Not on file  Relationships  . Social connections:    Talks on phone: Not on file    Gets together: Not on file    Attends religious service: Not on file    Active member of club or organization: Not on file    Attends meetings of clubs or organizations: Not on file    Relationship status: Not on file  . Intimate partner violence:    Fear of current or ex partner: Not on file    Emotionally abused: Not on file  Physically abused: Not on file    Forced sexual activity: Not on file  Other Topics Concern  . Not on file  Social History Narrative   Single.   1 daughter, 1 grandchild.   Retired. Once worked in [employer].   Enjoys sports, racing, traveling.     Past Surgical History:  Procedure Laterality Date  . L hand surgery    . R knee arthoroscopy    . SPLENECTOMY      Family History  Problem Relation Age of Onset  . Hypertension Mother   . Mental illness Mother   . Diabetes Mother   . Hypertension Father   . Lung cancer Father   . Diabetes Paternal Grandmother   . Colon cancer Maternal Uncle   . Esophageal cancer Neg Hx   . Liver cancer Neg Hx   . Pancreatic cancer Neg Hx   . Rectal cancer Neg Hx   . Stomach cancer Neg Hx      No Known Allergies  Current Outpatient Medications on File Prior to Visit  Medication Sig Dispense Refill  . Ascorbic Acid (VITAMIN C) 1000 MG tablet Take 1,000 mg by mouth daily.    Marland Kitchen atenolol (TENORMIN) 50 MG tablet Take 1 tablet (50 mg total) by mouth daily. 90 tablet 1  . atorvastatin (LIPITOR) 20 MG tablet Take 1 tablet (20 mg total) by mouth every evening. 90 tablet 3  . Cyanocobalamin (VITAMIN B-12 PO) Take 1 tablet by mouth daily.    . diclofenac (VOLTAREN) 75 MG EC tablet Take 75 mg by mouth daily.     . fluticasone (FLONASE) 50 MCG/ACT nasal spray Place 2 sprays into both nostrils daily. 1 g 3  . GARLIC PO Take 1 tablet by mouth daily.     . Ginseng (GIN-ZING PO) Take 1 capsule by mouth daily.     . hydrochlorothiazide (HYDRODIURIL) 25 MG tablet Take 1 tablet (25 mg total) by mouth daily. 90 tablet 1  . lisinopril (PRINIVIL,ZESTRIL) 40 MG tablet Take 1 tablet (40 mg total) by mouth daily. 90 tablet 3  . metFORMIN (GLUCOPHAGE) 1000 MG tablet Take 1 tablet (1,000 mg total) by mouth 2 (two) times daily with a meal. For diabetes. 180 tablet 1  . methocarbamol (ROBAXIN) 500 MG tablet Take 500 mg by mouth daily as needed for muscle spasms.     . Multiple Vitamins-Minerals (MULTIVITAMIN ADULT) TABS Take 1 tablet by mouth daily.    . Omega-3 Fatty Acids (OMEGA 3 PO) Take 1 capsule by mouth daily.    . sertraline (ZOLOFT) 50 MG tablet Take 1 tablet (50 mg total) by mouth daily. 90 tablet 0   Current Facility-Administered Medications on File Prior to Visit  Medication Dose Route Frequency Provider Last Rate Last Dose  . 0.9 %  sodium chloride infusion  500 mL Intravenous Once Lucio Edward T, MD        BP 120/70   Pulse 60   Temp 98.2 F (36.8 C) (Oral)   Ht 5' 7.5" (1.715 m)   Wt 230 lb 8 oz (104.6 kg)   SpO2 95%   BMI 35.57 kg/m    Objective:   Physical Exam  Constitutional: He is oriented to person, place, and time. He appears well-nourished.  HENT:  Mouth/Throat: No  oropharyngeal exudate.  Eyes: Pupils are equal, round, and reactive to light. EOM are normal.  Neck: Neck supple. No thyromegaly present.  Cardiovascular: Normal rate and regular rhythm.  Respiratory: Effort normal and breath sounds  normal.  GI: Soft. Bowel sounds are normal. There is no abdominal tenderness.  Musculoskeletal: Normal range of motion.  Neurological: He is alert and oriented to person, place, and time.  Skin: Skin is warm and dry.  Psychiatric: He has a normal mood and affect.           Assessment & Plan:

## 2018-05-20 NOTE — Assessment & Plan Note (Signed)
Improved with daily elevation and compression socks.  Continue same.

## 2018-05-20 NOTE — Assessment & Plan Note (Signed)
Not using CPAP machine, turned in his CPAP machine in Summer 2019, no pulmonology follow up in one year. Continues to snore often. Discussed to schedule follow up. He agrees.

## 2018-05-20 NOTE — Assessment & Plan Note (Signed)
Doing well on Zoloft 50 mg daily, continue same.  

## 2018-05-20 NOTE — Assessment & Plan Note (Signed)
Infrequent symptoms. Continue to monitor.  

## 2018-05-20 NOTE — Assessment & Plan Note (Signed)
Tetanus and influenza vaccinations UTD. Rx for Pneumovax and Shingrix provided today. PSA UTD. Colonoscopy UTD. Recommended to work on diet, start regular exercise.  Exam stable. Labs reviewed. Follow up in 1 year for CPE.

## 2018-05-20 NOTE — Assessment & Plan Note (Signed)
Lipid panel stable, continue atorvastatin.

## 2018-05-20 NOTE — Assessment & Plan Note (Addendum)
Completing monthly B12 injections.  B12 level below goal in January 2020. Discussed to continue with monthly compliance, add 3 months of B12 injections after March 2020. Repeat B12 lab in 3 months.

## 2018-05-20 NOTE — Assessment & Plan Note (Signed)
Chronic, taking diclofenac and methocarbamol PRN per orthopedics.  Continue same.

## 2018-05-20 NOTE — Assessment & Plan Note (Signed)
Stable in the office today, continue current regimen. BMP reviewed and is stable.  

## 2018-05-20 NOTE — Patient Instructions (Signed)
Start exercising. You should be getting 150 minutes of moderate intensity exercise weekly.  It is important that you improve your diet. Please limit carbohydrates in the form of white bread, rice, pasta, sweets, fast food, fried food, sugary drinks, etc. Increase your consumption of fresh fruits and vegetables, whole grains, lean protein.  Ensure you are consuming 64 ounces of water daily.  Schedule a follow up visit with the lung doctor for your CPAP machine.  Schedule an eye exam with your eye doctor as discussed.  Take the immunization prescription to your pharmacy.  Schedule monthly B12 injections for another three months, do this up front.  Please schedule a follow up appointment in 6 months for diabetes check.   It was a pleasure to see you today!   Preventive Care 40-64 Years, Male Preventive care refers to lifestyle choices and visits with your health care provider that can promote health and wellness. What does preventive care include?   A yearly physical exam. This is also called an annual well check.  Dental exams once or twice a year.  Routine eye exams. Ask your health care provider how often you should have your eyes checked.  Personal lifestyle choices, including: ? Daily care of your teeth and gums. ? Regular physical activity. ? Eating a healthy diet. ? Avoiding tobacco and drug use. ? Limiting alcohol use. ? Practicing safe sex. ? Taking low-dose aspirin every day starting at age 58. What happens during an annual well check? The services and screenings done by your health care provider during your annual well check will depend on your age, overall health, lifestyle risk factors, and family history of disease. Counseling Your health care provider may ask you questions about your:  Alcohol use.  Tobacco use.  Drug use.  Emotional well-being.  Home and relationship well-being.  Sexual activity.  Eating habits.  Work and work  Statistician. Screening You may have the following tests or measurements:  Height, weight, and BMI.  Blood pressure.  Lipid and cholesterol levels. These may be checked every 5 years, or more frequently if you are over 57 years old.  Skin check.  Lung cancer screening. You may have this screening every year starting at age 58 if you have a 30-pack-year history of smoking and currently smoke or have quit within the past 15 years.  Colorectal cancer screening. All adults should have this screening starting at age 51 and continuing until age 59. Your health care provider may recommend screening at age 52. You will have tests every 1-10 years, depending on your results and the type of screening test. People at increased risk should start screening at an earlier age. Screening tests may include: ? Guaiac-based fecal occult blood testing. ? Fecal immunochemical test (FIT). ? Stool DNA test. ? Virtual colonoscopy. ? Sigmoidoscopy. During this test, a flexible tube with a tiny camera (sigmoidoscope) is used to examine your rectum and lower colon. The sigmoidoscope is inserted through your anus into your rectum and lower colon. ? Colonoscopy. During this test, a long, thin, flexible tube with a tiny camera (colonoscope) is used to examine your entire colon and rectum.  Prostate cancer screening. Recommendations will vary depending on your family history and other risks.  Hepatitis C blood test.  Hepatitis B blood test.  Sexually transmitted disease (STD) testing.  Diabetes screening. This is done by checking your blood sugar (glucose) after you have not eaten for a while (fasting). You may have this done every 1-3 years. Discuss  your test results, treatment options, and if necessary, the need for more tests with your health care provider. Vaccines Your health care provider may recommend certain vaccines, such as:  Influenza vaccine. This is recommended every year.  Tetanus, diphtheria, and  acellular pertussis (Tdap, Td) vaccine. You may need a Td booster every 10 years.  Varicella vaccine. You may need this if you have not been vaccinated.  Zoster vaccine. You may need this after age 46.  Measles, mumps, and rubella (MMR) vaccine. You may need at least one dose of MMR if you were born in 1957 or later. You may also need a second dose.  Pneumococcal 13-valent conjugate (PCV13) vaccine. You may need this if you have certain conditions and have not been vaccinated.  Pneumococcal polysaccharide (PPSV23) vaccine. You may need one or two doses if you smoke cigarettes or if you have certain conditions.  Meningococcal vaccine. You may need this if you have certain conditions.  Hepatitis A vaccine. You may need this if you have certain conditions or if you travel or work in places where you may be exposed to hepatitis A.  Hepatitis B vaccine. You may need this if you have certain conditions or if you travel or work in places where you may be exposed to hepatitis B.  Haemophilus influenzae type b (Hib) vaccine. You may need this if you have certain risk factors. Talk to your health care provider about which screenings and vaccines you need and how often you need them. This information is not intended to replace advice given to you by your health care provider. Make sure you discuss any questions you have with your health care provider. Document Released: 04/14/2015 Document Revised: 05/08/2017 Document Reviewed: 01/17/2015 Elsevier Interactive Patient Education  2019 [employer].

## 2018-05-27 DIAGNOSIS — R69 Illness, unspecified: Secondary | ICD-10-CM | POA: Diagnosis not present

## 2018-06-03 ENCOUNTER — Ambulatory Visit (INDEPENDENT_AMBULATORY_CARE_PROVIDER_SITE_OTHER): Payer: Medicare HMO

## 2018-06-03 DIAGNOSIS — E538 Deficiency of other specified B group vitamins: Secondary | ICD-10-CM | POA: Diagnosis not present

## 2018-06-03 MED ORDER — CYANOCOBALAMIN 1000 MCG/ML IJ SOLN
1000.0000 ug | Freq: Once | INTRAMUSCULAR | Status: AC
  Administered 2018-06-03: 1000 ug via INTRAMUSCULAR

## 2018-06-03 NOTE — Progress Notes (Signed)
Per orders of Alma Friendly, NP injection of B12 monthly given by Kris Mouton. Patient tolerated injection well.

## 2018-07-07 ENCOUNTER — Ambulatory Visit (INDEPENDENT_AMBULATORY_CARE_PROVIDER_SITE_OTHER): Payer: Medicare HMO

## 2018-07-07 ENCOUNTER — Other Ambulatory Visit: Payer: Self-pay

## 2018-07-07 DIAGNOSIS — E538 Deficiency of other specified B group vitamins: Secondary | ICD-10-CM

## 2018-07-07 MED ORDER — CYANOCOBALAMIN 1000 MCG/ML IJ SOLN
1000.0000 ug | Freq: Once | INTRAMUSCULAR | Status: AC
  Administered 2018-07-07: 1000 ug via INTRAMUSCULAR

## 2018-07-07 NOTE — Progress Notes (Signed)
Pt given B12 injection in right deltoid. Tolerated well.

## 2018-08-11 ENCOUNTER — Ambulatory Visit: Payer: Medicare HMO

## 2018-08-12 ENCOUNTER — Ambulatory Visit (INDEPENDENT_AMBULATORY_CARE_PROVIDER_SITE_OTHER): Payer: Medicare HMO | Admitting: *Deleted

## 2018-08-12 ENCOUNTER — Encounter: Payer: Self-pay | Admitting: *Deleted

## 2018-08-12 DIAGNOSIS — E538 Deficiency of other specified B group vitamins: Secondary | ICD-10-CM | POA: Diagnosis not present

## 2018-08-12 MED ORDER — CYANOCOBALAMIN 1000 MCG/ML IJ SOLN
1000.0000 ug | Freq: Once | INTRAMUSCULAR | Status: AC
  Administered 2018-08-12: 1000 ug via INTRAMUSCULAR

## 2018-08-12 NOTE — Progress Notes (Signed)
Per orders of Allie Bossier, injection of B12 1090mcg/mL given by SAMBATH,CHANTHEARIN. Patient tolerated injection well.

## 2018-10-08 ENCOUNTER — Telehealth: Payer: Self-pay

## 2018-10-08 DIAGNOSIS — E538 Deficiency of other specified B group vitamins: Secondary | ICD-10-CM

## 2018-10-08 NOTE — Telephone Encounter (Signed)
Called to discuss this with patient. Lvm asking him to call office.  It looks like he only needed 3 months of B12, which was completed in May. It looks like he was supposed to do b12 labs in May that I do not see were drawn. Is this correct?

## 2018-10-08 NOTE — Telephone Encounter (Signed)
Please notify patient that we need to repeat his B12 level before any further injections. Schedule him for a lab only appointment please.

## 2018-10-08 NOTE — Telephone Encounter (Signed)
[facility] - Client Nonclinical Telephone Record [facility]urse Client [facility] - Client Client Site [place] - Night Physician [facility] - PHYSICIAN, Verita Schneiders- MD Contact Type Call Who Is Calling Patient / Member / Family / Caregiver Caller Name [place] Phone Number [phone] Patient Name Kenneth [name] Patient DOB 1955-12-10 Call Type Message Only Information Provided Reason for Call Request to Schedule Office Appointment Initial Comment Caller states he thinks it's time for his B-12 shot. Please call. Additional Comment Please call as soon as possible. Call Closed By: [place] Lions Transaction Date/Time: 10/08/2018 7:43:44 AM (ET)

## 2018-10-09 ENCOUNTER — Other Ambulatory Visit (INDEPENDENT_AMBULATORY_CARE_PROVIDER_SITE_OTHER): Payer: Medicare HMO

## 2018-10-09 DIAGNOSIS — E538 Deficiency of other specified B group vitamins: Secondary | ICD-10-CM

## 2018-10-09 LAB — VITAMIN B12: Vitamin B-12: 418 pg/mL (ref 200–1100)

## 2018-10-09 NOTE — Addendum Note (Signed)
Addended by: Ellamae Sia on: 10/09/2018 03:28 PM   Modules accepted: Orders

## 2018-10-09 NOTE — Telephone Encounter (Signed)
Patient returned call. Advised of message below.  He is scheduled for lab appointment this afternoon.

## 2018-10-09 NOTE — Telephone Encounter (Signed)
Noted  

## 2018-10-13 ENCOUNTER — Telehealth: Payer: Self-pay | Admitting: Primary Care

## 2018-10-13 NOTE — Telephone Encounter (Signed)
Spoken and notified patient of Kate Clark's comments. Patient verbalized understanding.  

## 2018-10-13 NOTE — Telephone Encounter (Signed)
Pt returned call to discuss lab results while Vallarie Mare was at lunch.

## 2018-11-04 ENCOUNTER — Other Ambulatory Visit: Payer: Self-pay

## 2018-11-04 ENCOUNTER — Emergency Department (HOSPITAL_COMMUNITY)
Admission: EM | Admit: 2018-11-04 | Discharge: 2018-11-04 | Disposition: A | Discharge status: Home / Self Care | Payer: Medicare Other | Attending: Emergency Medicine | Admitting: Emergency Medicine

## 2018-11-04 ENCOUNTER — Encounter (HOSPITAL_COMMUNITY): Payer: Self-pay | Admitting: Emergency Medicine

## 2018-11-04 DIAGNOSIS — E1122 Type 2 diabetes mellitus with diabetic chronic kidney disease: Secondary | ICD-10-CM | POA: Insufficient documentation

## 2018-11-04 DIAGNOSIS — N189 Chronic kidney disease, unspecified: Secondary | ICD-10-CM | POA: Diagnosis not present

## 2018-11-04 DIAGNOSIS — L298 Other pruritus: Secondary | ICD-10-CM | POA: Diagnosis not present

## 2018-11-04 DIAGNOSIS — L299 Pruritus, unspecified: Secondary | ICD-10-CM

## 2018-11-04 DIAGNOSIS — I129 Hypertensive chronic kidney disease with stage 1 through stage 4 chronic kidney disease, or unspecified chronic kidney disease: Secondary | ICD-10-CM | POA: Insufficient documentation

## 2018-11-04 DIAGNOSIS — Z7984 Long term (current) use of oral hypoglycemic drugs: Secondary | ICD-10-CM | POA: Diagnosis not present

## 2018-11-04 MED ORDER — DIPHENHYDRAMINE HCL 25 MG PO CAPS
50.0000 mg | ORAL_CAPSULE | Freq: Once | ORAL | Status: AC
  Administered 2018-11-04: 50 mg via ORAL
  Filled 2018-11-04: qty 2

## 2018-11-04 NOTE — ED Triage Notes (Signed)
Patient here from home with complaints of on and off itching. Increased in bilateral arms. OTC meds with no relief.

## 2018-11-04 NOTE — ED Provider Notes (Signed)
TIME SEEN: 6:25 AM  CHIEF COMPLAINT: Itching  HPI: Patient is a 63 year old male with history of hypertension, diabetes, hyperlipidemia, chronic kidney disease, bipolar disorder who presents to the emergency department with itching to his torso, upper extremities, scalp.  States symptoms have been intermittent for many weeks.  States he woke up tonight scratching and his significant other recommended he come to the ER.  He states he did start using a new lotion from [employer].  No other new soaps, lotions, detergents, medications or foods.  He does not have a rash.  He has not noticed any yellowing of his skin or eyes.  No abdominal pain.  Denies fevers, chest pain or shortness of breath.  No recent tick bite.  He has not seen his PCP or dermatologist for this.  He did not take any medications for itching at home.  ROS: See HPI Constitutional: no fever  Eyes: no drainage  ENT: no runny nose   Cardiovascular:  no chest pain  Resp: no SOB  GI: no vomiting GU: no dysuria Integumentary: no rash  Allergy: no hives  Musculoskeletal: no leg swelling  Neurological: no slurred speech ROS otherwise negative  PAST MEDICAL HISTORY/PAST SURGICAL HISTORY:  Past Medical History:  Diagnosis Date  . Anxiety and depression   . Bipolar 1 disorder ([place])   . Blood transfusion without reported diagnosis   . Chronic kidney disease   . Depression   . GERD (gastroesophageal reflux disease)   . Hypercholesterolemia   . Hypertension   . Sleep apnea   . Type 2 diabetes mellitus ([facility])     MEDICATIONS:  Prior to Admission medications   Medication Sig Start Date End Date Taking? Authorizing Provider  Ascorbic Acid (VITAMIN C) 1000 MG tablet Take 1,000 mg by mouth daily.    [provider]  atenolol (TENORMIN) 50 MG tablet Take 1 tablet (50 mg total) by mouth daily. 05/08/18   Pleas Koch, NP  atorvastatin (LIPITOR) 20 MG tablet Take 1 tablet (20 mg total) by mouth every evening. 05/08/18    Pleas Koch, NP  Cyanocobalamin (VITAMIN B-12 PO) Take 1 tablet by mouth daily.    [provider]  diclofenac (VOLTAREN) 75 MG EC tablet Take 75 mg by mouth daily.  04/11/17   [provider]  fluticasone (FLONASE) 50 MCG/ACT nasal spray Place 2 sprays into both nostrils daily. 04/07/18   Tonia Ghent, MD  GARLIC PO Take 1 tablet by mouth daily.     [provider]  Ginseng (GIN-ZING PO) Take 1 capsule by mouth daily.     [provider]  hydrochlorothiazide (HYDRODIURIL) 25 MG tablet Take 1 tablet (25 mg total) by mouth daily. 05/08/18   Pleas Koch, NP  lisinopril (PRINIVIL,ZESTRIL) 40 MG tablet Take 1 tablet (40 mg total) by mouth daily. 05/08/18   Pleas Koch, NP  metFORMIN (GLUCOPHAGE) 1000 MG tablet Take 1 tablet (1,000 mg total) by mouth 2 (two) times daily with a meal. For diabetes. 05/08/18   Pleas Koch, NP  methocarbamol (ROBAXIN) 500 MG tablet Take 500 mg by mouth daily as needed for muscle spasms.  04/11/17   [provider]  Multiple Vitamins-Minerals (MULTIVITAMIN ADULT) TABS Take 1 tablet by mouth daily.    [provider]  Omega-3 Fatty Acids (OMEGA 3 PO) Take 1 capsule by mouth daily.    [provider]  sertraline (ZOLOFT) 50 MG tablet Take 1 tablet (50 mg total)  by mouth daily. 05/08/18   Pleas Koch, NP    ALLERGIES:  No Known Allergies  SOCIAL HISTORY:  Social History   Tobacco Use  . Smoking status: Former Smoker    Packs/day: 0.50    Years: 41.00    Pack years: 20.50    Types: Cigarettes  . Smokeless tobacco: Never Used  . Tobacco comment: Quit in Feb.   Substance Use Topics  . Alcohol use: No    FAMILY HISTORY: Family History  Problem Relation Age of Onset  . Hypertension Mother   . Mental illness Mother   . Diabetes Mother   . Hypertension Father   . Lung cancer Father   . Diabetes Paternal Grandmother   . Colon cancer Maternal Uncle   . Esophageal cancer  Neg Hx   . Liver cancer Neg Hx   . Pancreatic cancer Neg Hx   . Rectal cancer Neg Hx   . Stomach cancer Neg Hx     EXAM: BP (!) 140/97 (BP Location: Left Arm)   Pulse 62   Temp 98.5 F (36.9 C) (Oral)   Resp 17   SpO2 97%  CONSTITUTIONAL: Alert and oriented and responds appropriately to questions. Well-appearing; well-nourished HEAD: Normocephalic EYES: Conjunctivae clear, pupils appear equal, EOMI ENT: normal nose; moist mucous membranes NECK: Supple, no meningismus, no nuchal rigidity, no LAD  CARD: RRR; S1 and S2 appreciated; no murmurs, no clicks, no rubs, no gallops RESP: Normal chest excursion without splinting or tachypnea; breath sounds clear and equal bilaterally; no wheezes, no rhonchi, no rales, no hypoxia or respiratory distress, speaking full sentences ABD/GI: Normal bowel sounds; non-distended; soft, non-tender, no rebound, no guarding, no peritoneal signs, no hepatosplenomegaly BACK:  The back appears normal and is non-tender to palpation, there is no CVA tenderness EXT: Normal ROM in all joints; non-tender to palpation; no edema; normal capillary refill; no cyanosis, no calf tenderness or swelling    SKIN: Normal color for age and race; warm; no rash, no petechiae or purpura, no blisters or desquamation, no bull's-eye rash, no redness or warmth, skin appears dry and scaly in some places NEURO: Moves all extremities equally PSYCH: The patient's mood and manner are appropriate. Grooming and personal hygiene are appropriate.  MEDICAL DECISION MAKING: Patient here with pruritus.  No abdominal pain or signs of elevated bilirubin today.  He has no rash on exam.  He does have some dry skin.  Have recommended over-the-counter Aveeno and recommended he stop the lotion that he recently received from [employer] as we discussed that these lotions have lots of fragrances that can cause itching.  I recommended sunscreen while outside.  He states he works outside frequently.   Nothing at this time to suggest a tickborne illness, cellulitis, vasculitis, SJS, TEN or other life-threatening cause of his itching today.  Have recommended Benadryl over-the-counter and will give outpatient dermatology follow-up.  He also has a PCP that he can follow-up with as an outpatient.   At this time, I do not feel there is any life-threatening condition present. I have reviewed and discussed all results (EKG, imaging, lab, urine as appropriate) and exam findings with patient/family. I have reviewed nursing notes and appropriate previous records.  I feel the patient is safe to be discharged home without further emergent workup and can continue workup as an outpatient as needed. Discussed usual and customary return precautions. Patient/family verbalize understanding and are comfortable with this plan.  Outpatient follow-up has been provided as  needed. All questions have been answered.      Ward, Delice Bison, DO 11/04/18 [phone]

## 2018-11-04 NOTE — Discharge Instructions (Addendum)
You may take Benadryl 50 mg over-the-counter as needed for itching.  Please follow-up with your primary care physician if symptoms do not improve or you develop a rash.  [facility] Dermatologists:   [facility]  [place]  [phone]  [facility]  [place] [address] [place]  [phone]   Dr. [place] Crane and Dr. Rozann Lesches [place] Richarda [place]  [phone]  [facility]  [place]  [phone]   [facility] [address]  [phone]

## 2018-11-17 DIAGNOSIS — L918 Other hypertrophic disorders of the skin: Secondary | ICD-10-CM | POA: Diagnosis not present

## 2018-11-17 DIAGNOSIS — L309 Dermatitis, unspecified: Secondary | ICD-10-CM | POA: Diagnosis not present

## 2018-11-18 ENCOUNTER — Other Ambulatory Visit: Payer: Self-pay

## 2018-11-18 ENCOUNTER — Encounter: Payer: Self-pay | Admitting: Primary Care

## 2018-11-18 ENCOUNTER — Ambulatory Visit (INDEPENDENT_AMBULATORY_CARE_PROVIDER_SITE_OTHER): Payer: Medicare Other | Admitting: Primary Care

## 2018-11-18 VITALS — BP 128/86 | HR 64 | Temp 98.2°F | Ht 67.5 in | Wt 221.8 lb

## 2018-11-18 DIAGNOSIS — G8929 Other chronic pain: Secondary | ICD-10-CM

## 2018-11-18 DIAGNOSIS — E538 Deficiency of other specified B group vitamins: Secondary | ICD-10-CM | POA: Diagnosis not present

## 2018-11-18 DIAGNOSIS — I10 Essential (primary) hypertension: Secondary | ICD-10-CM

## 2018-11-18 DIAGNOSIS — E119 Type 2 diabetes mellitus without complications: Secondary | ICD-10-CM

## 2018-11-18 DIAGNOSIS — M542 Cervicalgia: Secondary | ICD-10-CM

## 2018-11-18 LAB — CBC
HCT: 42.2 % (ref 39.0–52.0)
Hemoglobin: 14.6 g/dL (ref 13.0–17.0)
MCHC: 34.7 g/dL (ref 30.0–36.0)
MCV: 99.5 fl (ref 78.0–100.0)
Platelets: 374 10*3/uL (ref 150.0–400.0)
RBC: 4.24 Mil/uL (ref 4.22–5.81)
RDW: 13.2 % (ref 11.5–15.5)
WBC: 10.3 10*3/uL (ref 4.0–10.5)

## 2018-11-18 LAB — POCT GLYCOSYLATED HEMOGLOBIN (HGB A1C): Hemoglobin A1C: 6.2 % — AB (ref 4.0–5.6)

## 2018-11-18 LAB — VITAMIN B12: Vitamin B-12: 260 pg/mL (ref 211–911)

## 2018-11-18 MED ORDER — DICLOFENAC SODIUM 75 MG PO TBEC: 75.0000 mg | DELAYED_RELEASE_TABLET | Freq: Every day | ORAL | 0 refills | Status: DC

## 2018-11-18 MED ORDER — BLOOD GLUCOSE MONITOR KIT: PACK | 0 refills | Status: AC

## 2018-11-18 MED ORDER — METHOCARBAMOL 500 MG PO TABS: 500.0000 mg | ORAL_TABLET | Freq: Every day | ORAL | 0 refills | Status: DC | PRN

## 2018-11-18 NOTE — Assessment & Plan Note (Signed)
Refill provided for diclofenac and methocarbamol with instructions to use sparingly as needed.  Also discussed to avoid other NSAIDs while taking diclofenac.

## 2018-11-18 NOTE — Progress Notes (Signed)
Subjective:    Patient ID: Kenneth [name], male    DOB: 09-08-1955, 63 y.o.   MRN: [id number]  HPI  Kenneth [name] is a 63 year old male who presents today for follow up of diabetes.  Current medications include: Metformin 1000 mg BID.  He is not checking his blood sugars.  Last A1C: 6.4 in January 2020, 6.2 today. Last Eye Exam: No recent exam. Last Foot Exam: Due in 2021 Pneumonia Vaccination: Completed in February 2020 ACE/ARB: Lisinopril  Statin: atrovastatin   He is wanting a refill of his methocarbamol and diclofenac for chronic neck and shoulder pain. Previously following with orthopedics and managed on methocarbamol and dicofenac for which he took as needed. He called his orthopedist doctor for a medication refill and he directed him towards our office.   Has undergone shoulder injections in the past with last injection being in early 2020.  Last series of CBCs with elevated white blood cell count without acute infection.   BP Readings from Last 3 Encounters:  11/18/18 128/86  11/04/18 (!) 140/97  05/20/18 120/70     Review of Systems  Respiratory: Negative for shortness of breath.   Cardiovascular: Negative for chest pain.  Musculoskeletal: Positive for arthralgias and neck pain.  Neurological: Negative for dizziness.       Past Medical History:  Diagnosis Date  . Anxiety and depression   . Bipolar 1 disorder ([place])   . Blood transfusion without reported diagnosis   . Chronic kidney disease   . Depression   . GERD (gastroesophageal reflux disease)   . Hypercholesterolemia   . Hypertension   . Sleep apnea   . Type 2 diabetes mellitus ([place])      Social History   Socioeconomic History  . Marital status: Divorced    Spouse name: Not on file  . Number of children: Not on file  . Years of education: Not on file  . Highest education level: Not on file  Occupational History  . Not on file  Social Needs  . Financial resource strain: Not on file  . Food  insecurity    Worry: Not on file    Inability: Not on file  . Transportation needs    Medical: Not on file    Non-medical: Not on file  Tobacco Use  . Smoking status: Former Smoker    Packs/day: 0.50    Years: 41.00    Pack years: 20.50    Types: Cigarettes  . Smokeless tobacco: Never Used  . Tobacco comment: Quit in Feb.   Substance and Sexual Activity  . Alcohol use: No  . Drug use: No  . Sexual activity: Not on file  Lifestyle  . Physical activity    Days per week: Not on file    Minutes per session: Not on file  . Stress: Not on file  Relationships  . Social Herbalist on phone: Not on file    Gets together: Not on file    Attends religious service: Not on file    Active member of club or organization: Not on file    Attends meetings of clubs or organizations: Not on file    Relationship status: Not on file  . Intimate partner violence    Fear of current or ex partner: Not on file    Emotionally abused: Not on file    Physically abused: Not on file    Forced sexual activity: Not on file  Other Topics  Concern  . Not on file  Social History Narrative   Single.   1 daughter, 1 grandchild.   Retired. Once worked in [employer].   Enjoys sports, racing, traveling.     Past Surgical History:  Procedure Laterality Date  . L hand surgery    . R knee arthoroscopy    . SPLENECTOMY      Family History  Problem Relation Age of Onset  . Hypertension Mother   . Mental illness Mother   . Diabetes Mother   . Hypertension Father   . Lung cancer Father   . Diabetes Paternal Grandmother   . Colon cancer Maternal Uncle   . Esophageal cancer Neg Hx   . Liver cancer Neg Hx   . Pancreatic cancer Neg Hx   . Rectal cancer Neg Hx   . Stomach cancer Neg Hx     No Known Allergies  Current Outpatient Medications on File Prior to Visit  Medication Sig Dispense Refill  . Ascorbic Acid (VITAMIN C) 1000 MG tablet Take 1,000 mg by mouth daily.    Marland Kitchen atenolol (TENORMIN) 50  MG tablet Take 1 tablet (50 mg total) by mouth daily. 90 tablet 1  . atorvastatin (LIPITOR) 20 MG tablet Take 1 tablet (20 mg total) by mouth every evening. 90 tablet 3  . Cyanocobalamin (VITAMIN B-12 PO) Take 1 tablet by mouth daily.    . fluticasone (FLONASE) 50 MCG/ACT nasal spray Place 2 sprays into both nostrils daily. 1 g 3  . GARLIC PO Take 1 tablet by mouth daily.     . Ginseng (GIN-ZING PO) Take 1 capsule by mouth daily.     . hydrochlorothiazide (HYDRODIURIL) 25 MG tablet Take 1 tablet (25 mg total) by mouth daily. 90 tablet 1  . lisinopril (PRINIVIL,ZESTRIL) 40 MG tablet Take 1 tablet (40 mg total) by mouth daily. 90 tablet 3  . metFORMIN (GLUCOPHAGE) 1000 MG tablet Take 1 tablet (1,000 mg total) by mouth 2 (two) times daily with a meal. For diabetes. 180 tablet 1  . Multiple Vitamins-Minerals (MULTIVITAMIN ADULT) TABS Take 1 tablet by mouth daily.    . Omega-3 Fatty Acids (OMEGA 3 PO) Take 1 capsule by mouth daily.    . sertraline (ZOLOFT) 50 MG tablet Take 1 tablet (50 mg total) by mouth daily. 90 tablet 0   Current Facility-Administered Medications on File Prior to Visit  Medication Dose Route Frequency Provider Last Rate Last Dose  . 0.9 %  sodium chloride infusion  500 mL Intravenous Once Ladene Artist, MD        BP 128/86   Pulse 64   Temp 98.2 F (36.8 C) (Temporal)   Ht 5' 7.5" (1.715 m)   Wt 221 lb 12 oz (100.6 kg)   SpO2 97%   BMI 34.22 kg/m    Objective:   Physical Exam  Constitutional: He appears well-nourished.  Neck: Neck supple.  Cardiovascular: Normal rate and regular rhythm.  Respiratory: Effort normal and breath sounds normal.  Skin: Skin is warm and dry.           Assessment & Plan:

## 2018-11-18 NOTE — Assessment & Plan Note (Signed)
Recent level decreased from last check, only taking oral B12 at this time.  Continue oral 1000 mcg B12 tablets daily for now.  Repeat B12 in 3 months.  CBC today unremarkable.

## 2018-11-18 NOTE — Patient Instructions (Addendum)
Stop by the lab prior to leaving today. I will notify you of your results once received.   Use the muscle relaxer (methocarbamol) and pain medication (diclofenac) as needed for your neck and shoulder.  Do not take Advil/Motrin/Aleve/ibuprofen with the diclofenac.   Continue metformin twice daily for diabetes.  Check your blood sugars several times weekly. Best times to check are before any meal, 2 hours after any meal, bedtime.  It was a pleasure to see you today!

## 2018-11-18 NOTE — Assessment & Plan Note (Signed)
A1c today of 6.2 which is excellent control. Continue metformin as prescribed.  Foot exam and pneumonia vaccination up-to-date. Recommended he schedule an eye appointment. Managed on statin and ACE.  Follow-up in 6 months.

## 2018-11-19 ENCOUNTER — Telehealth: Payer: Self-pay | Admitting: Primary Care

## 2018-11-19 NOTE — Telephone Encounter (Signed)
Patient called in regards to his testing supplies. He spoke with the [facility] and they stated they have no received a script from our office.   Patient would like a call back once his testing supplies have been sent

## 2018-11-20 NOTE — Telephone Encounter (Signed)
Left detail message for patient that [facility] have the Rx but waiting for item to arrive.

## 2018-12-01 DIAGNOSIS — E119 Type 2 diabetes mellitus without complications: Secondary | ICD-10-CM | POA: Diagnosis not present

## 2018-12-01 LAB — HM DIABETES EYE EXAM

## 2018-12-07 ENCOUNTER — Other Ambulatory Visit: Payer: Self-pay | Admitting: Primary Care

## 2018-12-07 DIAGNOSIS — F32A Depression, unspecified: Secondary | ICD-10-CM

## 2018-12-07 DIAGNOSIS — F329 Major depressive disorder, single episode, unspecified: Secondary | ICD-10-CM

## 2018-12-09 ENCOUNTER — Other Ambulatory Visit: Payer: Self-pay

## 2018-12-09 ENCOUNTER — Encounter (HOSPITAL_COMMUNITY): Payer: Self-pay | Admitting: Emergency Medicine

## 2018-12-09 ENCOUNTER — Emergency Department (HOSPITAL_COMMUNITY): Payer: Medicare Other

## 2018-12-09 ENCOUNTER — Telehealth: Payer: Self-pay | Admitting: Primary Care

## 2018-12-09 ENCOUNTER — Emergency Department (HOSPITAL_COMMUNITY)
Admission: EM | Admit: 2018-12-09 | Discharge: 2018-12-09 | Disposition: A | Discharge status: Home / Self Care | Payer: Medicare Other | Attending: Emergency Medicine | Admitting: Emergency Medicine

## 2018-12-09 DIAGNOSIS — Z87891 Personal history of nicotine dependence: Secondary | ICD-10-CM | POA: Insufficient documentation

## 2018-12-09 DIAGNOSIS — S43401A Unspecified sprain of right shoulder joint, initial encounter: Secondary | ICD-10-CM | POA: Diagnosis not present

## 2018-12-09 DIAGNOSIS — W19XXXA Unspecified fall, initial encounter: Secondary | ICD-10-CM

## 2018-12-09 DIAGNOSIS — S4391XA Sprain of unspecified parts of right shoulder girdle, initial encounter: Secondary | ICD-10-CM | POA: Insufficient documentation

## 2018-12-09 DIAGNOSIS — Z79899 Other long term (current) drug therapy: Secondary | ICD-10-CM | POA: Insufficient documentation

## 2018-12-09 DIAGNOSIS — Y929 Unspecified place or not applicable: Secondary | ICD-10-CM | POA: Insufficient documentation

## 2018-12-09 DIAGNOSIS — I10 Essential (primary) hypertension: Secondary | ICD-10-CM | POA: Diagnosis not present

## 2018-12-09 DIAGNOSIS — M25511 Pain in right shoulder: Secondary | ICD-10-CM | POA: Diagnosis not present

## 2018-12-09 DIAGNOSIS — Y939 Activity, unspecified: Secondary | ICD-10-CM | POA: Insufficient documentation

## 2018-12-09 DIAGNOSIS — E119 Type 2 diabetes mellitus without complications: Secondary | ICD-10-CM | POA: Insufficient documentation

## 2018-12-09 DIAGNOSIS — Z7984 Long term (current) use of oral hypoglycemic drugs: Secondary | ICD-10-CM | POA: Insufficient documentation

## 2018-12-09 DIAGNOSIS — S4991XA Unspecified injury of right shoulder and upper arm, initial encounter: Secondary | ICD-10-CM | POA: Diagnosis not present

## 2018-12-09 DIAGNOSIS — Y999 Unspecified external cause status: Secondary | ICD-10-CM | POA: Insufficient documentation

## 2018-12-09 DIAGNOSIS — W11XXXA Fall on and from ladder, initial encounter: Secondary | ICD-10-CM | POA: Insufficient documentation

## 2018-12-09 MED ORDER — ACETAMINOPHEN 325 MG PO TABS: 650.0000 mg | ORAL_TABLET | ORAL | Status: DC | PRN

## 2018-12-09 MED ORDER — METHOCARBAMOL 500 MG PO TABS
500.0000 mg | ORAL_TABLET | Freq: Once | ORAL | Status: AC
  Administered 2018-12-09: 500 mg via ORAL
  Filled 2018-12-09: qty 1

## 2018-12-09 NOTE — ED Provider Notes (Signed)
[facility] DEPT Provider Note   CSN: [id number] Arrival date & time: 12/09/18  1318     History   Chief Complaint Chief Complaint  Patient presents with  . Fall    HPI Kenneth [name] is a 63 y.o. male.     HPI Patient states he fell from a laughter roughly 4 feet onto his right shoulder.  Denies hitting his head or loss of consciousness.  Complaining of pain to the right shoulder and tightness across his back.  No focal weakness or numbness.  No abdominal pain.  No nausea or vomiting.  Fall happened roughly 5 hours ago. Past Medical History:  Diagnosis Date  . Anxiety and depression   . Bipolar 1 disorder ([place])   . Blood transfusion without reported diagnosis   . Chronic kidney disease   . Depression   . GERD (gastroesophageal reflux disease)   . Hypercholesterolemia   . Hypertension   . Sleep apnea   . Type 2 diabetes mellitus [facility])     Patient Active Problem List   Diagnosis Date Noted  . Lower extremity numbness 04/17/2018  . Rhinorrhea 04/08/2018  . Vitamin B 12 deficiency 07/28/2017  . Tobacco abuse 05/05/2017  . Preventative health care 04/24/2017  . Numbness and tingling 04/15/2017  . OSA (obstructive sleep apnea) 02/07/2017  . Nausea 09/10/2016  . Epigastric abdominal tenderness without rebound tenderness 09/10/2016  . GERD without esophagitis 09/10/2016  . Nonspecific abnormal electrocardiogram (ECG) (EKG) 09/10/2016  . Lower extremity edema 08/12/2016  . Type 2 diabetes mellitus without complication, without long-term current use of insulin ([place]) 06/26/2016  . Anxiety and depression 06/26/2016  . Hypertension 06/26/2016  . Hyperlipidemia 06/26/2016  . Chronic neck pain 06/26/2016    Past Surgical History:  Procedure Laterality Date  . L hand surgery    . R knee arthoroscopy    . SPLENECTOMY          Home Medications    Prior to Admission medications   Medication Sig Start Date End Date Taking? Authorizing  Provider  Ascorbic Acid (VITAMIN C) 1000 MG tablet Take 1,000 mg by mouth daily.    [provider]  atenolol (TENORMIN) 50 MG tablet Take 1 tablet (50 mg total) by mouth daily. 05/08/18   Pleas Koch, NP  atorvastatin (LIPITOR) 20 MG tablet Take 1 tablet (20 mg total) by mouth every evening. 05/08/18   Pleas Koch, NP  blood glucose meter kit and supplies KIT Dispense based on patient and insurance preference. Use up to four times daily as directed. (FOR ICD-9 250.00, 250.01). 11/18/18   Pleas Koch, NP  Cyanocobalamin (VITAMIN B-12 PO) Take 1 tablet by mouth daily.    [provider]  diclofenac (VOLTAREN) 75 MG EC tablet Take 1 tablet (75 mg total) by mouth daily. 11/18/18   Pleas Koch, NP  fluticasone (FLONASE) 50 MCG/ACT nasal spray Place 2 sprays into both nostrils daily. 04/07/18   Tonia Ghent, MD  GARLIC PO Take 1 tablet by mouth daily.     [provider]  Ginseng (GIN-ZING PO) Take 1 capsule by mouth daily.     [provider]  hydrochlorothiazide (HYDRODIURIL) 25 MG tablet Take 1 tablet (25 mg total) by mouth daily. 05/08/18   Pleas Koch, NP  lisinopril (PRINIVIL,ZESTRIL) 40 MG tablet Take 1 tablet (40 mg total) by mouth daily. 05/08/18   Pleas Koch, NP  metFORMIN (GLUCOPHAGE) 1000 MG tablet Take 1  tablet (1,000 mg total) by mouth 2 (two) times daily with a meal. For diabetes. 05/08/18   Pleas Koch, NP  methocarbamol (ROBAXIN) 500 MG tablet Take 1 tablet (500 mg total) by mouth daily as needed for muscle spasms. 11/18/18   Pleas Koch, NP  Multiple Vitamins-Minerals (MULTIVITAMIN ADULT) TABS Take 1 tablet by mouth daily.    [provider]  Omega-3 Fatty Acids (OMEGA 3 PO) Take 1 capsule by mouth daily.    [provider]  sertraline (ZOLOFT) 50 MG tablet TAKE 1 TABLET BY MOUTH EVERY DAY 12/08/18   Pleas Koch, NP    Family History Family History  Problem Relation Age of Onset   . Hypertension Mother   . Mental illness Mother   . Diabetes Mother   . Hypertension Father   . Lung cancer Father   . Diabetes Paternal Grandmother   . Colon cancer Maternal Uncle   . Esophageal cancer Neg Hx   . Liver cancer Neg Hx   . Pancreatic cancer Neg Hx   . Rectal cancer Neg Hx   . Stomach cancer Neg Hx     Social History Social History   Tobacco Use  . Smoking status: Former Smoker    Packs/day: 0.50    Years: 41.00    Pack years: 20.50    Types: Cigarettes  . Smokeless tobacco: Never Used  . Tobacco comment: Quit in Feb.   Substance Use Topics  . Alcohol use: No  . Drug use: No     Allergies   Patient has no known allergies.   Review of Systems Review of Systems  Constitutional: Negative for chills, fatigue and fever.  HENT: Negative for facial swelling, sore throat and trouble swallowing.   Eyes: Negative for photophobia, pain and visual disturbance.  Respiratory: Negative for shortness of breath.   Cardiovascular: Negative for chest pain.  Gastrointestinal: Negative for abdominal pain, diarrhea, nausea and vomiting.  Genitourinary: Negative for hematuria.  Musculoskeletal: Positive for myalgias. Negative for back pain, neck pain and neck stiffness.  Skin: Negative for rash and wound.  Neurological: Negative for dizziness, syncope, weakness, light-headedness, numbness and headaches.  All other systems reviewed and are negative.    Physical Exam Updated Vital Signs BP (!) 156/96 (BP Location: Left Arm)   Pulse 67   Temp 98.7 F (37.1 C) (Oral)   Resp 18   SpO2 94%   Physical Exam Vitals signs and nursing note reviewed.  Constitutional:      Appearance: Normal appearance. He is well-developed.  HENT:     Head: Normocephalic and atraumatic.     Comments: No obvious head trauma.  Midface is stable.    Nose: Nose normal.     Mouth/Throat:     Mouth: Mucous membranes are moist.  Eyes:     Extraocular Movements: Extraocular movements  intact.     Pupils: Pupils are equal, round, and reactive to light.  Neck:     Musculoskeletal: Normal range of motion and neck supple. No neck rigidity or muscular tenderness.     Comments: No posterior midline cervical tenderness to palpation. Cardiovascular:     Rate and Rhythm: Normal rate and regular rhythm.     Heart sounds: No murmur. No friction rub. No gallop.   Pulmonary:     Effort: Pulmonary effort is normal. No respiratory distress.     Breath sounds: Normal breath sounds. No stridor. No wheezing, rhonchi or rales.  Chest:  Chest wall: No tenderness.  Abdominal:     General: Bowel sounds are normal. There is distension.     Palpations: Abdomen is soft.     Tenderness: There is no abdominal tenderness. There is no right CVA tenderness, left CVA tenderness, guarding or rebound.  Musculoskeletal: Normal range of motion.        General: Tenderness present. No swelling, deformity or signs of injury.     Right lower leg: No edema.     Left lower leg: No edema.     Comments: Mild tenderness to palpation of the right deltoid.  Patient has full range of motion without obvious deformity.  Distal pulses are 2+.  No midline thoracic or lumbar tenderness.  Pelvis is stable.  Lymphadenopathy:     Cervical: No cervical adenopathy.  Skin:    General: Skin is warm and dry.     Capillary Refill: Capillary refill takes less than 2 seconds.     Findings: No erythema or rash.  Neurological:     General: No focal deficit present.     Mental Status: He is alert and oriented to person, place, and time.     Comments: 5/5 motor in all extremities.  Sensation fully intact.  Psychiatric:        Behavior: Behavior normal.      ED Treatments / Results  Labs (all labs ordered are listed, but only abnormal results are displayed) Labs Reviewed - No data to display  EKG None  Radiology Dg Shoulder Right  Result Date: 12/09/2018 CLINICAL DATA:  Recent fall from ladder with right shoulder  pain, initial encounter EXAM: RIGHT SHOULDER - 2+ VIEW COMPARISON:  None. FINDINGS: Degenerative changes of the acromioclavicular joint are noted. No acute fracture or dislocation is seen. No other focal abnormality is noted. IMPRESSION: Degenerative change without acute abnormality. Electronically Signed   By: Inez Catalina M.D.   On: 12/09/2018 14:54    Procedures Procedures (including critical care time)  Medications Ordered in ED Medications  acetaminophen (TYLENOL) tablet 650 mg (has no administration in time range)  methocarbamol (ROBAXIN) tablet 500 mg (has no administration in time range)     Initial Impression / Assessment and Plan / ED Course  I have reviewed the triage vital signs and the nursing notes.  Pertinent labs & imaging results that were available during my care of the patient were reviewed by me and considered in my medical decision making (see chart for details).        Patient is well-appearing.  Abdominal exam is benign.  x-ray without acute findings.  Will treat symptomatically.  Return precautions given.  Final Clinical Impressions(s) / ED Diagnoses   Final diagnoses:  Fall, initial encounter  Sprain of right shoulder, unspecified shoulder sprain type, initial encounter    ED Discharge Orders    None       Julianne Rice, MD 12/09/18 1731

## 2018-12-09 NOTE — ED Triage Notes (Signed)
Patient states his ladder gave way-states he might have fell 4 ft or less-states he fell on right shoulder-no LOC

## 2018-12-09 NOTE — Telephone Encounter (Signed)
Left detail message for patient.  I have inform patient that normal range is about 180-70. I have mention that if patient noticed blood sugar readings in the 200s or higher to let us know on more than 2 days.  Regarding putting the strip the wrong way. I have inform him that if wipe the meter, the best you can. The meter should be fine, he is not the only person that have put the strip the wrong way.

## 2018-12-09 NOTE — Telephone Encounter (Signed)
Patient is calling to get information on testing his blood sugars.  He stated he is new at using a meter and testing so he would like to know what the normal range is when he test and what he should consider high/low etc.  Patient stated he also ended up placing the testing strip in the wrong way with blood on it and would like to know if this could effect his numbers when he test and how he should clean it out without messing up his meter.    Best C/B # [phone] Patient said a detailed message on his V/m was ok if he did not answer

## 2018-12-09 NOTE — Discharge Instructions (Addendum)
You may continue to use diclofenac and methocarbamol as needed for pain.  You may also take Tylenol as well.  Follow-up with your primary physician as needed.

## 2018-12-15 ENCOUNTER — Encounter: Payer: Self-pay | Admitting: Primary Care

## 2018-12-18 DIAGNOSIS — S43402A Unspecified sprain of left shoulder joint, initial encounter: Secondary | ICD-10-CM | POA: Diagnosis not present

## 2018-12-18 DIAGNOSIS — M542 Cervicalgia: Secondary | ICD-10-CM | POA: Diagnosis not present

## 2018-12-18 DIAGNOSIS — S43101A Unspecified dislocation of right acromioclavicular joint, initial encounter: Secondary | ICD-10-CM | POA: Diagnosis not present

## 2018-12-19 ENCOUNTER — Other Ambulatory Visit: Payer: Self-pay | Admitting: Primary Care

## 2018-12-25 ENCOUNTER — Telehealth: Payer: Self-pay

## 2018-12-25 NOTE — Telephone Encounter (Signed)
[facility] Night - Client Nonclinical Telephone Record AccessNurse Client [place] Night - Client Client Site [place] Physician Alma Friendly - NP Contact Type Call Who Is Calling Patient / Member / Family / Caregiver Caller Name [place] Phone Number [phone] Patient Name Kenneth [name] Patient DOB 02-17-1956 Call Type Message Only Information Provided Reason for Call Request to [facility] Appointment Initial Comment Caller states he would like to cancel appointment for 09/29 Additional Comment Call Closed By: Ignacia Marvel Transaction Date/Time: 12/24/2018 5:23:03 PM (ET)

## 2018-12-29 ENCOUNTER — Ambulatory Visit: Payer: Medicare Other | Admitting: Primary Care

## 2019-01-04 ENCOUNTER — Telehealth: Payer: Self-pay | Admitting: Primary Care

## 2019-01-04 ENCOUNTER — Other Ambulatory Visit: Payer: Self-pay | Admitting: Primary Care

## 2019-01-04 DIAGNOSIS — M542 Cervicalgia: Secondary | ICD-10-CM

## 2019-01-04 DIAGNOSIS — G8929 Other chronic pain: Secondary | ICD-10-CM

## 2019-01-04 NOTE — Telephone Encounter (Signed)
Message left for patient to return my call.  

## 2019-01-04 NOTE — Telephone Encounter (Signed)
Patient called and wanted to know how often he's suppose to check his sugar.

## 2019-01-05 NOTE — Telephone Encounter (Signed)
Pt returned your call Best number [phone]

## 2019-01-05 NOTE — Telephone Encounter (Signed)
Message left for patient to return my call.  

## 2019-01-08 NOTE — Telephone Encounter (Signed)
Message left for patient to return my call.  

## 2019-01-17 ENCOUNTER — Other Ambulatory Visit: Payer: Self-pay | Admitting: Primary Care

## 2019-01-17 DIAGNOSIS — I10 Essential (primary) hypertension: Secondary | ICD-10-CM

## 2019-01-27 ENCOUNTER — Other Ambulatory Visit: Payer: Self-pay | Admitting: Family Medicine

## 2019-02-17 ENCOUNTER — Telehealth: Payer: Self-pay

## 2019-02-17 NOTE — Telephone Encounter (Signed)
LVM w COVID screen and back lab info 11.18.2020 TLJ

## 2019-02-18 ENCOUNTER — Other Ambulatory Visit: Payer: Self-pay | Admitting: Primary Care

## 2019-02-18 DIAGNOSIS — E538 Deficiency of other specified B group vitamins: Secondary | ICD-10-CM

## 2019-02-24 ENCOUNTER — Other Ambulatory Visit (INDEPENDENT_AMBULATORY_CARE_PROVIDER_SITE_OTHER): Payer: Medicare Other

## 2019-02-24 DIAGNOSIS — E538 Deficiency of other specified B group vitamins: Secondary | ICD-10-CM | POA: Diagnosis not present

## 2019-02-24 LAB — VITAMIN B12: Vitamin B-12: 225 pg/mL (ref 211–911)

## 2019-03-08 ENCOUNTER — Other Ambulatory Visit: Payer: Self-pay | Admitting: Primary Care

## 2019-03-08 ENCOUNTER — Encounter: Payer: Self-pay | Admitting: *Deleted

## 2019-03-08 DIAGNOSIS — G8929 Other chronic pain: Secondary | ICD-10-CM

## 2019-03-08 DIAGNOSIS — M542 Cervicalgia: Secondary | ICD-10-CM

## 2019-03-17 ENCOUNTER — Other Ambulatory Visit: Payer: Self-pay | Admitting: Primary Care

## 2019-03-17 DIAGNOSIS — E119 Type 2 diabetes mellitus without complications: Secondary | ICD-10-CM

## 2019-04-07 ENCOUNTER — Other Ambulatory Visit: Payer: Self-pay | Admitting: Primary Care

## 2019-04-07 DIAGNOSIS — M542 Cervicalgia: Secondary | ICD-10-CM

## 2019-04-07 DIAGNOSIS — G8929 Other chronic pain: Secondary | ICD-10-CM

## 2019-04-09 ENCOUNTER — Other Ambulatory Visit: Payer: Self-pay

## 2019-04-09 NOTE — Patient Outreach (Signed)
[facility]) Care Management  04/09/2019  Kenneth [name] July 10, 1955 [id number]   Medication Adherence call to Kenneth [name] Telephone call to Patient regarding Medication Adherence unable to reach patient. [name] is showing past due on Atorvastatin 20 mg under [place].   [facility] Management Direct Dial [phone]  Fax [phone] [email or web address]

## 2019-04-16 ENCOUNTER — Other Ambulatory Visit: Payer: Self-pay | Admitting: Primary Care

## 2019-04-17 ENCOUNTER — Other Ambulatory Visit: Payer: Self-pay | Admitting: Primary Care

## 2019-04-17 DIAGNOSIS — I10 Essential (primary) hypertension: Secondary | ICD-10-CM

## 2019-04-22 ENCOUNTER — Telehealth: Payer: Self-pay

## 2019-04-22 NOTE — Telephone Encounter (Signed)
Inform patient that we do not offer the covid vaccine at the office. Also let him know that the phase state is offering are 109 and older. Ask him to wait patiently until it is announced for phase he would be in.  Also patient stated that for the methocarbamol, patient wanted to asked if he can add 1/2 tablet when the spasms are really bad. He is not taking this often but lately it has been bad. He stated that he talk to [name] more in February if it feels worse.   Ok to take 1 and 1/2 tablet when needed?

## 2019-04-22 NOTE — Telephone Encounter (Signed)
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 Time Disposition Final User 04/21/2019 5:09:08 PM General Information Provided Yes Ilona Sorrel Call Closed By: Ilona Sorrel Transaction Date/Time: 04/21/2019 5:06:34 PM (ET)

## 2019-04-22 NOTE — Telephone Encounter (Signed)
Noted. Yes, okay to take 1 and 1/2 tablet sparingly as needed.

## 2019-04-22 NOTE — Telephone Encounter (Signed)
Kenneth [name] Does pt need appointment to discuss covid vaccine.  If not when can he get the vaccine.  Can he go ahead a be put on wait list

## 2019-04-22 NOTE — Telephone Encounter (Signed)
Spoken and notified patient of Kate Clark's comments. Patient verbalized understanding.  

## 2019-05-02 ENCOUNTER — Other Ambulatory Visit: Payer: Self-pay | Admitting: Primary Care

## 2019-05-02 DIAGNOSIS — E119 Type 2 diabetes mellitus without complications: Secondary | ICD-10-CM

## 2019-05-02 DIAGNOSIS — E538 Deficiency of other specified B group vitamins: Secondary | ICD-10-CM

## 2019-05-02 DIAGNOSIS — E785 Hyperlipidemia, unspecified: Secondary | ICD-10-CM

## 2019-05-02 DIAGNOSIS — I10 Essential (primary) hypertension: Secondary | ICD-10-CM

## 2019-05-05 ENCOUNTER — Ambulatory Visit (INDEPENDENT_AMBULATORY_CARE_PROVIDER_SITE_OTHER): Payer: Medicare HMO

## 2019-05-05 ENCOUNTER — Other Ambulatory Visit (INDEPENDENT_AMBULATORY_CARE_PROVIDER_SITE_OTHER): Payer: Medicare HMO

## 2019-05-05 ENCOUNTER — Other Ambulatory Visit: Payer: Self-pay

## 2019-05-05 ENCOUNTER — Ambulatory Visit: Payer: Medicare HMO

## 2019-05-05 VITALS — BP 157/93

## 2019-05-05 DIAGNOSIS — Z0000 Encounter for general adult medical examination without abnormal findings: Secondary | ICD-10-CM | POA: Diagnosis not present

## 2019-05-05 DIAGNOSIS — I10 Essential (primary) hypertension: Secondary | ICD-10-CM

## 2019-05-05 DIAGNOSIS — E538 Deficiency of other specified B group vitamins: Secondary | ICD-10-CM

## 2019-05-05 DIAGNOSIS — E119 Type 2 diabetes mellitus without complications: Secondary | ICD-10-CM | POA: Diagnosis not present

## 2019-05-05 DIAGNOSIS — E785 Hyperlipidemia, unspecified: Secondary | ICD-10-CM

## 2019-05-05 LAB — COMPREHENSIVE METABOLIC PANEL
ALT: 27 U/L (ref 0–53)
AST: 18 U/L (ref 0–37)
Albumin: 3.9 g/dL (ref 3.5–5.2)
Alkaline Phosphatase: 62 U/L (ref 39–117)
BUN: 12 mg/dL (ref 6–23)
CO2: 29 mEq/L (ref 19–32)
Calcium: 8.9 mg/dL (ref 8.4–10.5)
Chloride: 106 mEq/L (ref 96–112)
Creatinine, Ser: 0.83 mg/dL (ref 0.40–1.50)
GFR: 93.46 mL/min (ref >60.00)
Glucose, Bld: 92 mg/dL (ref 70–99)
Potassium: 3.8 mEq/L (ref 3.5–5.1)
Sodium: 142 mEq/L (ref 135–145)
Total Bilirubin: 0.4 mg/dL (ref 0.2–1.2)
Total Protein: 6.5 g/dL (ref 6.0–8.3)

## 2019-05-05 LAB — HEMOGLOBIN A1C: Hgb A1c MFr Bld: 6.1 % (ref 4.6–6.5)

## 2019-05-05 LAB — LIPID PANEL
Cholesterol: 144 mg/dL (ref 0–200)
HDL: 31.1 mg/dL — ABNORMAL LOW (ref >39.00)
LDL Cholesterol: 87 mg/dL (ref 0–99)
NonHDL: 113.35
Total CHOL/HDL Ratio: 5
Triglycerides: 134 mg/dL (ref 0.0–149.0)
VLDL: 26.8 mg/dL (ref 0.0–40.0)

## 2019-05-05 LAB — VITAMIN B12: Vitamin B-12: 260 pg/mL (ref 211–911)

## 2019-05-05 NOTE — Progress Notes (Signed)
Subjective:   Kenneth [name] is a 64 y.o. male who presents for Medicare Annual/Subsequent preventive examination.  Review of Systems: N/A   This visit is being conducted through telemedicine via telephone at the nurse health advisor's home address due to the COVID-19 pandemic. This patient has given me verbal consent via doximity to conduct this visit, patient states they are participating from their home address. Patient and myself are on the telephone call. There is no referral for this visit. Some vital signs may be absent or patient reported.    Patient identification: identified by name, DOB, and current address   Cardiac Risk Factors include: advanced age (>30mn, >>68women);diabetes mellitus;dyslipidemia;hypertension;male gender;smoking/ tobacco exposure     Objective:    Vitals: BP (!) 157/93   There is no height or weight on file to calculate BMI.  Advanced Directives 05/05/2019 11/04/2018 04/30/2018 01/19/2018 04/15/2017 08/02/2016 06/15/2016  Does Patient Have a Medical Advance Directive? No No No No No No No  Would patient like information on creating a medical advance directive? No - Patient declined - No - Patient declined No - Patient declined Yes (MAU/Ambulatory/Procedural Areas - Information given) No - Patient declined -    Tobacco Social History   Tobacco Use  Smoking Status Current Every Day Smoker  . Packs/day: 1.00  . Years: 41.00  . Pack years: 41.00  . Types: Cigarettes  Smokeless Tobacco Never Used     Ready to quit: Not Answered Counseling given: Not Answered   Clinical Intake:  Pre-visit preparation completed: Yes  Pain : No/denies pain     Nutritional Risks: None Diabetes: Yes CBG done?: No Did pt. bring in CBG monitor from home?: No  How often do you need to have someone help you when you read instructions, pamphlets, or other written materials from your doctor or pharmacy?: 1 - Never What is the last grade level you completed in school?:  12th  Interpreter Needed?: No  Information entered by :: CJohnson, LPN  Past Medical History:  Diagnosis Date  . Anxiety and depression   . B[place] 1 disorder [place]   . Blood transfusion without reported diagnosis   . Chronic kidney disease   . Depression   . GERD (gastroesophageal reflux disease)   . Hypercholesterolemia   . Hypertension   . Sleep apnea   . Type 2 diabetes mellitus ([place]    Past Surgical History:  Procedure Laterality Date  . L hand surgery    . R knee arthoroscopy    . SPLENECTOMY     Family History  Problem Relation Age of Onset  . Hypertension Mother   . Mental illness Mother   . Diabetes Mother   . Hypertension Father   . Lung cancer Father   . Diabetes Paternal Grandmother   . Colon cancer Maternal Uncle   . Esophageal cancer Neg Hx   . Liver cancer Neg Hx   . Pancreatic cancer Neg Hx   . Rectal cancer Neg Hx   . Stomach cancer Neg Hx    Social History   Socioeconomic History  . Marital status: Divorced    Spouse name: Not on file  . Number of children: Not on file  . Years of education: Not on file  . Highest education level: Not on file  Occupational History  . Not on file  Tobacco Use  . Smoking status: Current Every Day Smoker    Packs/day: 1.00    Years: 41.00    Pack  years: 41.00    Types: Cigarettes  . Smokeless tobacco: Never Used  Substance and Sexual Activity  . Alcohol use: No  . Drug use: No  . Sexual activity: Not on file  Other Topics Concern  . Not on file  Social History Narrative   Single.   1 daughter, 1 grandchild.   Retired. Once worked in [employer].   Enjoys sports, racing, traveling.    Social Determinants of Health   Financial Resource Strain: Low Risk   . Difficulty of Paying Living Expenses: Not hard at all  Food Insecurity: No Food Insecurity  . Worried About Charity fundraiser in the Last Year: Never true  . Ran Out of Food in the Last Year: Never true  Transportation Needs: No Transportation  Needs  . Lack of Transportation (Medical): No  . Lack of Transportation (Non-Medical): No  Physical Activity: Inactive  . Days of Exercise per Week: 0 days  . Minutes of Exercise per Session: 0 min  Stress: No Stress Concern Present  . Feeling of Stress : Not at all  Social Connections:   . Frequency of Communication with Friends and Family: Not on file  . Frequency of Social Gatherings with Friends and Family: Not on file  . Attends Religious Services: Not on file  . Active Member of Clubs or Organizations: Not on file  . Attends Archivist Meetings: Not on file  . Marital Status: Not on file    Outpatient Encounter Medications as of 05/05/2019  Medication Sig  . Accu-Chek FastClix Lancets MISC USE TO CHECK BLOOD SUGAR UP TO 4 TIMES A DAY  . ACCU-CHEK GUIDE test strip USE TO CHECK UP TO 4 TIMES A DAY  . Ascorbic Acid (VITAMIN C) 1000 MG tablet Take 1,000 mg by mouth daily.  Marland Kitchen atenolol (TENORMIN) 50 MG tablet TAKE 1 TABLET BY MOUTH EVERY DAY  . atorvastatin (LIPITOR) 20 MG tablet Take 1 tablet (20 mg total) by mouth every evening.  . blood glucose meter kit and supplies KIT Dispense based on patient and insurance preference. Use up to four times daily as directed. (FOR ICD-9 250.00, 250.01).  . Cyanocobalamin (VITAMIN B-12 PO) Take 1 tablet by mouth daily.  . diclofenac (VOLTAREN) 75 MG EC tablet TAKE 1 TABLET BY MOUTH EVERY DAY  . fluticasone (FLONASE) 50 MCG/ACT nasal spray SPRAY 2 SPRAYS INTO EACH NOSTRIL EVERY DAY  . GARLIC PO Take 1 tablet by mouth daily.   . Ginseng (GIN-ZING PO) Take 1 capsule by mouth daily.   . hydrochlorothiazide (HYDRODIURIL) 25 MG tablet TAKE 1 TABLET BY MOUTH EVERY DAY  . lisinopril (PRINIVIL,ZESTRIL) 40 MG tablet Take 1 tablet (40 mg total) by mouth daily.  . metFORMIN (GLUCOPHAGE) 1000 MG tablet TAKE 1 TABLET (1,000 MG TOTAL) BY MOUTH 2 (TWO) TIMES DAILY WITH A MEAL. FOR DIABETES.  . methocarbamol (ROBAXIN) 500 MG tablet TAKE 1 TABLET (500 MG  TOTAL) BY MOUTH DAILY AS NEEDED FOR MUSCLE SPASMS.  . Multiple Vitamins-Minerals (MULTIVITAMIN ADULT) TABS Take 1 tablet by mouth daily.  . Omega-3 Fatty Acids (OMEGA 3 PO) Take 1 capsule by mouth daily.  . sertraline (ZOLOFT) 50 MG tablet TAKE 1 TABLET BY MOUTH EVERY DAY   Facility-Administered Encounter Medications as of 05/05/2019  Medication  . 0.9 %  sodium chloride infusion    Activities of Daily Living In your present state of health, do you have any difficulty performing the following activities: 05/05/2019  Hearing? N  Vision? N  Difficulty concentrating or making decisions? N  Walking or climbing stairs? N  Dressing or bathing? N  Doing errands, shopping? N  Preparing Food and eating ? N  Using the Toilet? N  In the past six months, have you accidently leaked urine? N  Do you have problems with loss of bowel control? N  Managing your Medications? N  Managing your Finances? N  Housekeeping or managing your Housekeeping? N  Some recent data might be hidden    Patient Care Team: Pleas Koch, NP as PCP - General (Internal Medicine) Unk Pinto, MD as Attending Physician (Internal Medicine)   Assessment:   This is a routine wellness examination for [place].  Exercise Activities and Dietary recommendations Current Exercise Habits: The patient does not participate in regular exercise at present, Exercise limited by: None identified  Goals    . Follow up with Primary Care Provider     Starting 04/30/2018, I will continue to take medications as prescribed and to keep appointments with PCP as scheduled.     . Patient Stated     05/05/2019, I will maintain and continue medications as prescribed.        Fall Risk Fall Risk  05/05/2019 04/30/2018 04/15/2017  Falls in the past year? 1 0 No  Comment tripped and fell - -  Number falls in past yr: 1 - -  Injury with Fall? 0 - -  Risk for fall due to : Medication side effect - -  Follow up Falls evaluation completed;Falls  prevention discussed - -   Is the patient's home free of loose throw rugs in walkways, pet beds, electrical cords, etc?   yes      Grab bars in the bathroom? no      Handrails on the stairs?   yes      Adequate lighting?   yes  Timed Get Up and Go Performed: N/A  Depression Screen PHQ 2/9 Scores 05/05/2019 04/30/2018 04/15/2017  PHQ - 2 Score 0 0 0  PHQ- 9 Score 0 0 3    Cognitive Function MMSE - Mini Mental State Exam 05/05/2019 04/30/2018 04/15/2017  Orientation to time 5 5 5   Orientation to Place 5 5 5   Registration 3 3 3   Attention/ Calculation 5 0 0  Recall 3 3 3   Language- name 2 objects - 0 0  Language- repeat 1 1 1   Language- follow 3 step command - 3 3  Language- read & follow direction - 0 0  Write a sentence - 0 0  Copy design - 0 0  Total score - 20 20  Mini Cog  Mini-Cog screen was completed. Maximum score is 22. A value of 0 denotes this part of the MMSE was not completed or the patient failed this part of the Mini-Cog screening.       Immunization History  Administered Date(s) Administered  . Influenza,inj,Quad PF,6+ Mos 04/24/2017, 02/04/2018  . Pneumococcal Polysaccharide-23 05/27/2018  . Tdap 10/08/2013    Qualifies for Shingles Vaccine? Yes  Screening Tests Health Maintenance  Topic Date Due  . INFLUENZA VACCINE  10/31/2018  . FOOT EXAM  05/21/2019  . HEMOGLOBIN A1C  05/21/2019  . OPHTHALMOLOGY EXAM  12/01/2019  . TETANUS/TDAP  10/09/2023  . COLONOSCOPY  06/24/2027  . PNEUMOCOCCAL POLYSACCHARIDE VACCINE AGE 76-64 HIGH RISK  Completed  . Hepatitis C Screening  Completed  . HIV Screening  Completed   Cancer Screenings: Lung: Low Dose CT Chest recommended if Age 62-80 years, 53  pack-year currently smoking OR have quit w/in 15 years. Patient does not qualify. Colorectal: completed 06/23/2017  Additional Screenings:  Hepatitis C Screening: 04/15/2017      Plan:    Patient will maintain and continue mediations as prescribed.   I have personally  reviewed and noted the following in the patient's chart:   . Medical and social history . Use of alcohol, tobacco or illicit drugs  . Current medications and supplements . Functional ability and status . Nutritional status . Physical activity . Advanced directives . List of other physicians . Hospitalizations, surgeries, and ER visits in previous 12 months . Vitals . Screenings to include cognitive, depression, and falls . Referrals and appointments  In addition, I have reviewed and discussed with patient certain preventive protocols, quality metrics, and best practice recommendations. A written personalized care plan for preventive services as well as general preventive health recommendations were provided to patient.     Andrez Grime, LPN  12/06/2950

## 2019-05-05 NOTE — Patient Instructions (Signed)
Kenneth [name] , Thank you for taking time to come for your Medicare Wellness Visit. I appreciate your ongoing commitment to your health goals. Please review the following plan we discussed and let me know if I can assist you in the future.   Screening recommendations/referrals: Colonoscopy: Up to date, completed 06/23/2017 Recommended yearly ophthalmology/optometry visit for glaucoma screening and checkup Recommended yearly dental visit for hygiene and checkup  Vaccinations: Influenza vaccine: needs flu vaccine  Pneumococcal vaccine: Completed series Tdap vaccine: Up to date, completed 10/08/2013 Shingles vaccine: discussed    Advanced directives: Advance directive discussed with you today. Even though you declined this today please call our office should you change your mind and we can give you the proper paperwork for you to fill out.  Conditions/risks identified: diabetes, hypertension, hyperlipidemia  Next appointment: 05/14/2019 @ 2 pm   Preventive Care 40-64 Years, Male Preventive care refers to lifestyle choices and visits with your health care provider that can promote health and wellness. What does preventive care include?  A yearly physical exam. This is also called an annual well check.  Dental exams once or twice a year.  Routine eye exams. Ask your health care provider how often you should have your eyes checked.  Personal lifestyle choices, including:  Daily care of your teeth and gums.  Regular physical activity.  Eating a healthy diet.  Avoiding tobacco and drug use.  Limiting alcohol use.  Practicing safe sex.  Taking low-dose aspirin every day starting at age 83. What happens during an annual well check? The services and screenings done by your health care provider during your annual well check will depend on your age, overall health, lifestyle risk factors, and family history of disease. Counseling  Your health care provider may ask you questions about  your:  Alcohol use.  Tobacco use.  Drug use.  Emotional well-being.  Home and relationship well-being.  Sexual activity.  Eating habits.  Work and work Statistician. Screening  You may have the following tests or measurements:  Height, weight, and BMI.  Blood pressure.  Lipid and cholesterol levels. These may be checked every 5 years, or more frequently if you are over 25 years old.  Skin check.  Lung cancer screening. You may have this screening every year starting at age 29 if you have a 30-pack-year history of smoking and currently smoke or have quit within the past 15 years.  Fecal occult blood test (FOBT) of the stool. You may have this test every year starting at age 6.  Flexible sigmoidoscopy or colonoscopy. You may have a sigmoidoscopy every 5 years or a colonoscopy every 10 years starting at age 4.  Prostate cancer screening. Recommendations will vary depending on your family history and other risks.  Hepatitis C blood test.  Hepatitis B blood test.  Sexually transmitted disease (STD) testing.  Diabetes screening. This is done by checking your blood sugar (glucose) after you have not eaten for a while (fasting). You may have this done every 1-3 years. Discuss your test results, treatment options, and if necessary, the need for more tests with your health care provider. Vaccines  Your health care provider may recommend certain vaccines, such as:  Influenza vaccine. This is recommended every year.  Tetanus, diphtheria, and acellular pertussis (Tdap, Td) vaccine. You may need a Td booster every 10 years.  Zoster vaccine. You may need this after age 21.  Pneumococcal 13-valent conjugate (PCV13) vaccine. You may need this if you have certain conditions and  have not been vaccinated.  Pneumococcal polysaccharide (PPSV23) vaccine. You may need one or two doses if you smoke cigarettes or if you have certain conditions. Talk to your health care provider about  which screenings and vaccines you need and how often you need them. This information is not intended to replace advice given to you by your health care provider. Make sure you discuss any questions you have with your health care provider. Document Released: 04/14/2015 Document Revised: 12/06/2015 Document Reviewed: 01/17/2015 Elsevier Interactive Patient Education  2017 [place] Prevention in the Home Falls can cause injuries. They can happen to people of all ages. There are many things you can do to make your home safe and to help prevent falls. What can I do on the outside of my home?  Regularly fix the edges of walkways and driveways and fix any cracks.  Remove anything that might make you trip as you walk through a door, such as a raised step or threshold.  Trim any bushes or trees on the path to your home.  Use bright outdoor lighting.  Clear any walking paths of anything that might make someone trip, such as rocks or tools.  Regularly check to see if handrails are loose or broken. Make sure that both sides of any steps have handrails.  Any raised decks and porches should have guardrails on the edges.  Have any leaves, snow, or ice cleared regularly.  Use sand or salt on walking paths during winter.  Clean up any spills in your garage right away. This includes oil or grease spills. What can I do in the bathroom?  Use night lights.  Install grab bars by the toilet and in the tub and shower. Do not use towel bars as grab bars.  Use non-skid mats or decals in the tub or shower.  If you need to sit down in the shower, use a plastic, non-slip stool.  Keep the floor dry. Clean up any water that spills on the floor as soon as it happens.  Remove soap buildup in the tub or shower regularly.  Attach bath mats securely with double-sided non-slip rug tape.  Do not have throw rugs and other things on the floor that can make you trip. What can I do in the  bedroom?  Use night lights.  Make sure that you have a light by your bed that is easy to reach.  Do not use any sheets or blankets that are too big for your bed. They should not hang down onto the floor.  Have a firm chair that has side arms. You can use this for support while you get dressed.  Do not have throw rugs and other things on the floor that can make you trip. What can I do in the kitchen?  Clean up any spills right away.  Avoid walking on wet floors.  Keep items that you use a lot in easy-to-reach places.  If you need to reach something above you, use a strong step stool that has a grab bar.  Keep electrical cords out of the way.  Do not use floor polish or wax that makes floors slippery. If you must use wax, use non-skid floor wax.  Do not have throw rugs and other things on the floor that can make you trip. What can I do with my stairs?  Do not leave any items on the stairs.  Make sure that there are handrails on both sides of the stairs and  use them. Fix handrails that are broken or loose. Make sure that handrails are as long as the stairways.  Check any carpeting to make sure that it is firmly attached to the stairs. Fix any carpet that is loose or worn.  Avoid having throw rugs at the top or bottom of the stairs. If you do have throw rugs, attach them to the floor with carpet tape.  Make sure that you have a light switch at the top of the stairs and the bottom of the stairs. If you do not have them, ask someone to add them for you. What else can I do to help prevent falls?  Wear shoes that:  Do not have high heels.  Have rubber bottoms.  Are comfortable and fit you well.  Are closed at the toe. Do not wear sandals.  If you use a stepladder:  Make sure that it is fully opened. Do not climb a closed stepladder.  Make sure that both sides of the stepladder are locked into place.  Ask someone to hold it for you, if possible.  Clearly mark and make  sure that you can see:  Any grab bars or handrails.  First and last steps.  Where the edge of each step is.  Use tools that help you move around (mobility aids) if they are needed. These include:  Canes.  Walkers.  Scooters.  Crutches.  Turn on the lights when you go into a dark area. Replace any light bulbs as soon as they burn out.  Set up your furniture so you have a clear path. Avoid moving your furniture around.  If any of your floors are uneven, fix them.  If there are any pets around you, be aware of where they are.  Review your medicines with your doctor. Some medicines can make you feel dizzy. This can increase your chance of falling. Ask your doctor what other things that you can do to help prevent falls. This information is not intended to replace advice given to you by your health care provider. Make sure you discuss any questions you have with your health care provider. Document Released: 01/12/2009 Document Revised: 08/24/2015 Document Reviewed: 04/22/2014 Elsevier Interactive Patient Education  2017 [employer].

## 2019-05-05 NOTE — Progress Notes (Signed)
PCP notes:  Health Maintenance: Needs flu vaccine    Abnormal Screenings: none   Patient concerns: none   Nurse concerns: none   Next PCP appt.: 05/14/2019 @ 2 pm

## 2019-05-12 DIAGNOSIS — M542 Cervicalgia: Secondary | ICD-10-CM | POA: Diagnosis not present

## 2019-05-12 DIAGNOSIS — M4722 Other spondylosis with radiculopathy, cervical region: Secondary | ICD-10-CM | POA: Diagnosis not present

## 2019-05-14 ENCOUNTER — Other Ambulatory Visit: Payer: Self-pay

## 2019-05-14 ENCOUNTER — Ambulatory Visit (INDEPENDENT_AMBULATORY_CARE_PROVIDER_SITE_OTHER): Payer: Medicare HMO | Admitting: Primary Care

## 2019-05-14 VITALS — BP 130/86 | HR 67 | Temp 97.1°F | Ht 67.5 in | Wt 238.2 lb

## 2019-05-14 DIAGNOSIS — E538 Deficiency of other specified B group vitamins: Secondary | ICD-10-CM | POA: Diagnosis not present

## 2019-05-14 DIAGNOSIS — Z0000 Encounter for general adult medical examination without abnormal findings: Secondary | ICD-10-CM

## 2019-05-14 DIAGNOSIS — F419 Anxiety disorder, unspecified: Secondary | ICD-10-CM

## 2019-05-14 DIAGNOSIS — E119 Type 2 diabetes mellitus without complications: Secondary | ICD-10-CM

## 2019-05-14 DIAGNOSIS — E785 Hyperlipidemia, unspecified: Secondary | ICD-10-CM | POA: Diagnosis not present

## 2019-05-14 DIAGNOSIS — G4733 Obstructive sleep apnea (adult) (pediatric): Secondary | ICD-10-CM | POA: Diagnosis not present

## 2019-05-14 DIAGNOSIS — R202 Paresthesia of skin: Secondary | ICD-10-CM

## 2019-05-14 DIAGNOSIS — M542 Cervicalgia: Secondary | ICD-10-CM

## 2019-05-14 DIAGNOSIS — I10 Essential (primary) hypertension: Secondary | ICD-10-CM | POA: Diagnosis not present

## 2019-05-14 DIAGNOSIS — G8929 Other chronic pain: Secondary | ICD-10-CM

## 2019-05-14 DIAGNOSIS — R200 Anesthesia of skin: Secondary | ICD-10-CM

## 2019-05-14 DIAGNOSIS — M546 Pain in thoracic spine: Secondary | ICD-10-CM | POA: Insufficient documentation

## 2019-05-14 DIAGNOSIS — F329 Major depressive disorder, single episode, unspecified: Secondary | ICD-10-CM

## 2019-05-14 DIAGNOSIS — K219 Gastro-esophageal reflux disease without esophagitis: Secondary | ICD-10-CM | POA: Diagnosis not present

## 2019-05-14 DIAGNOSIS — F32A Depression, unspecified: Secondary | ICD-10-CM

## 2019-05-14 DIAGNOSIS — Z23 Encounter for immunization: Secondary | ICD-10-CM

## 2019-05-14 DIAGNOSIS — R69 Illness, unspecified: Secondary | ICD-10-CM | POA: Diagnosis not present

## 2019-05-14 MED ORDER — ZOSTER VAC RECOMB ADJUVANTED 50 MCG/0.5ML IM SUSR: 0.5000 mL | Freq: Once | INTRAMUSCULAR | 1 refills | Status: AC

## 2019-05-14 NOTE — Assessment & Plan Note (Signed)
Left scapular region, seems muscular. Following with orthopedics.  He is scheduled for PT.

## 2019-05-14 NOTE — Patient Instructions (Signed)
Start exercising. You should be getting 150 minutes of moderate intensity exercise weekly.  It's important to improve your diet by reducing consumption of fast food, fried food, processed snack foods, sugary drinks. Increase consumption of fresh vegetables and fruits, whole grains, water.  Ensure you are drinking 64 ounces of water daily.  Take the Shingles vaccine to your pharmacy.  Please schedule a follow up appointment in 6 months for diabetes check.   It was a pleasure to see you today!   Preventive Care 64-26 Years Old, Male Preventive care refers to lifestyle choices and visits with your health care provider that can promote health and wellness. This includes:  A yearly physical exam. This is also called an annual well check.  Regular dental and eye exams.  Immunizations.  Screening for certain conditions.  Healthy lifestyle choices, such as eating a healthy diet, getting regular exercise, not using drugs or products that contain nicotine and tobacco, and limiting alcohol use. What can I expect for my preventive care visit? Physical exam Your health care provider will check:  Height and weight. These may be used to calculate body mass index (BMI), which is a measurement that tells if you are at a healthy weight.  Heart rate and blood pressure.  Your skin for abnormal spots. Counseling Your health care provider may ask you questions about:  Alcohol, tobacco, and drug use.  Emotional well-being.  Home and relationship well-being.  Sexual activity.  Eating habits.  Work and work Statistician. What immunizations do I need?  Influenza (flu) vaccine  This is recommended every year. Tetanus, diphtheria, and pertussis (Tdap) vaccine  You may need a Td booster every 10 years. Varicella (chickenpox) vaccine  You may need this vaccine if you have not already been vaccinated. Zoster (shingles) vaccine  You may need this after age 82. Measles, mumps, and rubella  (MMR) vaccine  You may need at least one dose of MMR if you were born in 1957 or later. You may also need a second dose. Pneumococcal conjugate (PCV13) vaccine  You may need this if you have certain conditions and were not previously vaccinated. Pneumococcal polysaccharide (PPSV23) vaccine  You may need one or two doses if you smoke cigarettes or if you have certain conditions. Meningococcal conjugate (MenACWY) vaccine  You may need this if you have certain conditions. Hepatitis A vaccine  You may need this if you have certain conditions or if you travel or work in places where you may be exposed to hepatitis A. Hepatitis B vaccine  You may need this if you have certain conditions or if you travel or work in places where you may be exposed to hepatitis B. Haemophilus influenzae type b (Hib) vaccine  You may need this if you have certain risk factors. Human papillomavirus (HPV) vaccine  If recommended by your health care provider, you may need three doses over 6 months. You may receive vaccines as individual doses or as more than one vaccine together in one shot (combination vaccines). Talk with your health care provider about the risks and benefits of combination vaccines. What tests do I need? Blood tests  Lipid and cholesterol levels. These may be checked every 5 years, or more frequently if you are over 13 years old.  Hepatitis C test.  Hepatitis B test. Screening  Lung cancer screening. You may have this screening every year starting at age 40 if you have a 30-pack-year history of smoking and currently smoke or have quit within the past  15 years.  Prostate cancer screening. Recommendations will vary depending on your family history and other risks.  Colorectal cancer screening. All adults should have this screening starting at age 22 and continuing until age 65. Your health care provider may recommend screening at age 64 if you are at increased risk. You will have tests  every 1-10 years, depending on your results and the type of screening test.  Diabetes screening. This is done by checking your blood sugar (glucose) after you have not eaten for a while (fasting). You may have this done every 1-3 years.  Sexually transmitted disease (STD) testing. Follow these instructions at home: Eating and drinking  Eat a diet that includes fresh fruits and vegetables, whole grains, lean protein, and low-fat dairy products.  Take vitamin and mineral supplements as recommended by your health care provider.  Do not drink alcohol if your health care provider tells you not to drink.  If you drink alcohol: ? Limit how much you have to 0-2 drinks a day. ? Be aware of how much alcohol is in your drink. In the [place] one drink equals one 12 oz bottle of beer (355 mL), one 5 oz glass of wine (148 mL), or one 1 oz glass of hard liquor (44 mL). Lifestyle  Take daily care of your teeth and gums.  Stay active. Exercise for at least 30 minutes on 5 or more days each week.  Do not use any products that contain nicotine or tobacco, such as cigarettes, e-cigarettes, and chewing tobacco. If you need help quitting, ask your health care provider.  If you are sexually active, practice safe sex. Use a condom or other form of protection to prevent STIs (sexually transmitted infections).  Talk with your health care provider about taking a low-dose aspirin every day starting at age 58. What's next?  Go to your health care provider once a year for a well check visit.  Ask your health care provider how often you should have your eyes and teeth checked.  Stay up to date on all vaccines. This information is not intended to replace advice given to you by your health care provider. Make sure you discuss any questions you have with your health care provider. Document Revised: 03/12/2018 Document Reviewed: 03/12/2018 Elsevier Patient Education  2020 [employer].

## 2019-05-14 NOTE — Assessment & Plan Note (Signed)
Compliant to atorvastatin, recent LDL at goal of <100. Encouraged a healthy diet and regular exercise.

## 2019-05-14 NOTE — Assessment & Plan Note (Signed)
Doing well on Zoloft, denies SI/HI. Continue same.

## 2019-05-14 NOTE — Assessment & Plan Note (Signed)
Overall improved but continues to left side after working out in the yard. Recently evaluated by orthopedics. Continue diclofenac and methocarbamol.

## 2019-05-14 NOTE — Progress Notes (Signed)
Subjective:    Patient ID: Kenneth [name], male    DOB: 10/30/1955, 64 y.o.   MRN: [id number]  HPI  This visit occurred during the SARS-CoV-2 public health emergency.  Safety protocols were in place, including screening questions prior to the visit, additional usage of staff PPE, and extensive cleaning of exam room while observing appropriate contact time as indicated for disinfecting solutions.   Mr. Kenneth [name] is a 64 year old male who presents today for complete physical.  Immunizations: -Tetanus: Completed in 2015 -Influenza: Due -Shingles: Never completed Shingrix. -Pneumonia: Completed in 2020  Diet: He is working on a better diet. Exercise: No regular exercise  Eye exam: Completed in September 2020 Dental exam: No recent exam  Colonoscopy: Completed in 2019, due in 2029 PSA: 1.31 Hep C Screen: Negative  BP Readings from Last 3 Encounters:  05/14/19 130/86  05/05/19 (!) 157/93  12/09/18 (!) 165/92      Review of Systems  Constitutional: Negative for unexpected weight change.  HENT: Negative for rhinorrhea.   Respiratory: Negative for cough and shortness of breath.   Cardiovascular: Negative for chest pain.  Gastrointestinal: Negative for constipation and diarrhea.  Genitourinary: Negative for difficulty urinating.  Musculoskeletal: Positive for arthralgias.  Skin: Negative for rash.  Allergic/Immunologic: Negative for environmental allergies.  Neurological: Negative for dizziness and headaches.  Psychiatric/Behavioral: The patient is not nervous/anxious.        Past Medical History:  Diagnosis Date  . Anxiety and depression   . Bipolar 1 disorder ([place])   . Blood transfusion without reported diagnosis   . Chronic kidney disease   . Depression   . GERD (gastroesophageal reflux disease)   . Hypercholesterolemia   . Hypertension   . Sleep apnea   . Type 2 diabetes mellitus ([place])      Social History   Socioeconomic History  . Marital status: Divorced      Spouse name: Not on file  . Number of children: Not on file  . Years of education: Not on file  . Highest education level: Not on file  Occupational History  . Not on file  Tobacco Use  . Smoking status: Current Every Day Smoker    Packs/day: 1.00    Years: 41.00    Pack years: 41.00    Types: Cigarettes  . Smokeless tobacco: Never Used  Substance and Sexual Activity  . Alcohol use: No  . Drug use: No  . Sexual activity: Not on file  Other Topics Concern  . Not on file  Social History Narrative   Single.   1 daughter, 1 grandchild.   Retired. Once worked in [employer].   Enjoys sports, racing, traveling.    Social Determinants of Health   Financial Resource Strain: Low Risk   . Difficulty of Paying Living Expenses: Not hard at all  Food Insecurity: No Food Insecurity  . Worried About Charity fundraiser in the Last Year: Never true  . Ran Out of Food in the Last Year: Never true  Transportation Needs: No Transportation Needs  . Lack of Transportation (Medical): No  . Lack of Transportation (Non-Medical): No  Physical Activity: Inactive  . Days of Exercise per Week: 0 days  . Minutes of Exercise per Session: 0 min  Stress: No Stress Concern Present  . Feeling of Stress : Not at all  Social Connections:   . Frequency of Communication with Friends and Family: Not on file  . Frequency of Social Gatherings with Friends  and Family: Not on file  . Attends Religious Services: Not on file  . Active Member of Clubs or Organizations: Not on file  . Attends Archivist Meetings: Not on file  . Marital Status: Not on file  Intimate Partner Violence: Not At Risk  . Fear of Current or Ex-Partner: No  . Emotionally Abused: No  . Physically Abused: No  . Sexually Abused: No    Past Surgical History:  Procedure Laterality Date  . L hand surgery    . R knee arthoroscopy    . SPLENECTOMY      Family History  Problem Relation Age of Onset  . Hypertension Mother   .  Mental illness Mother   . Diabetes Mother   . Hypertension Father   . Lung cancer Father   . Diabetes Paternal Grandmother   . Colon cancer Maternal Uncle   . Esophageal cancer Neg Hx   . Liver cancer Neg Hx   . Pancreatic cancer Neg Hx   . Rectal cancer Neg Hx   . Stomach cancer Neg Hx     No Known Allergies  Current Outpatient Medications on File Prior to Visit  Medication Sig Dispense Refill  . Accu-Chek FastClix Lancets MISC USE TO CHECK BLOOD SUGAR UP TO 4 TIMES A DAY 100 each 3  . ACCU-CHEK GUIDE test strip USE TO CHECK UP TO 4 TIMES A DAY 100 strip 3  . Ascorbic Acid (VITAMIN C) 1000 MG tablet Take 1,000 mg by mouth daily.    Marland Kitchen atenolol (TENORMIN) 50 MG tablet TAKE 1 TABLET BY MOUTH EVERY DAY 90 tablet 1  . atorvastatin (LIPITOR) 20 MG tablet Take 1 tablet (20 mg total) by mouth every evening. 90 tablet 3  . blood glucose meter kit and supplies KIT Dispense based on patient and insurance preference. Use up to four times daily as directed. (FOR ICD-9 250.00, 250.01). 1 each 0  . Cyanocobalamin (VITAMIN B-12 PO) Take 1 tablet by mouth daily.    . diclofenac (VOLTAREN) 75 MG EC tablet TAKE 1 TABLET BY MOUTH EVERY DAY 60 tablet 0  . fluticasone (FLONASE) 50 MCG/ACT nasal spray SPRAY 2 SPRAYS INTO EACH NOSTRIL EVERY DAY 16 mL 3  . GARLIC PO Take 1 tablet by mouth daily.     . Ginseng (GIN-ZING PO) Take 1 capsule by mouth daily.     . hydrochlorothiazide (HYDRODIURIL) 25 MG tablet TAKE 1 TABLET BY MOUTH EVERY DAY 90 tablet 1  . lisinopril (PRINIVIL,ZESTRIL) 40 MG tablet Take 1 tablet (40 mg total) by mouth daily. 90 tablet 3  . metFORMIN (GLUCOPHAGE) 1000 MG tablet TAKE 1 TABLET (1,000 MG TOTAL) BY MOUTH 2 (TWO) TIMES DAILY WITH A MEAL. FOR DIABETES. 180 tablet 1  . methocarbamol (ROBAXIN) 500 MG tablet TAKE 1 TABLET (500 MG TOTAL) BY MOUTH DAILY AS NEEDED FOR MUSCLE SPASMS. 30 tablet 0  . Multiple Vitamins-Minerals (MULTIVITAMIN ADULT) TABS Take 1 tablet by mouth daily.    .  Omega-3 Fatty Acids (OMEGA 3 PO) Take 1 capsule by mouth daily.    . sertraline (ZOLOFT) 50 MG tablet TAKE 1 TABLET BY MOUTH EVERY DAY 90 tablet 1   Current Facility-Administered Medications on File Prior to Visit  Medication Dose Route Frequency Provider Last Rate Last Admin  . 0.9 %  sodium chloride infusion  500 mL Intravenous Once Ladene Artist, MD        BP 130/86   Pulse 67   Temp (!) 97.1 F (  36.2 C) (Temporal)   Ht 5' 7.5" (1.715 m)   Wt 238 lb 4 oz (108.1 kg)   SpO2 97%   BMI 36.76 kg/m    Objective:   Physical Exam  Constitutional: He is oriented to person, place, and time. He appears well-nourished.  HENT:  Right Ear: Tympanic membrane and ear canal normal.  Left Ear: Tympanic membrane and ear canal normal.  Mouth/Throat: Oropharynx is clear and moist.  Eyes: Pupils are equal, round, and reactive to light. EOM are normal.  Cardiovascular: Normal rate and regular rhythm.  Respiratory: Effort normal and breath sounds normal.  GI: Soft. Bowel sounds are normal. There is no abdominal tenderness.  Musculoskeletal:        General: Normal range of motion.     Cervical back: Neck supple.  Neurological: He is alert and oriented to person, place, and time. No cranial nerve deficit.  Reflex Scores:      Patellar reflexes are 2+ on the right side and 2+ on the left side. Skin: Skin is warm and dry.  Psychiatric: He has a normal mood and affect.           Assessment & Plan:

## 2019-05-14 NOTE — Assessment & Plan Note (Signed)
A1C of 6.1 on recent labs. Continue Metformin.  Managed on statin and ACE. Eye and foot exam UTD. Pneumonia vaccination UTD.  Follow up in 6 months.

## 2019-05-14 NOTE — Assessment & Plan Note (Signed)
No use of CPAP machine in years. Declines renewal.

## 2019-05-14 NOTE — Assessment & Plan Note (Signed)
Intermittent, typically triggered by certain foods. Continue PRN treatment.

## 2019-05-14 NOTE — Assessment & Plan Note (Signed)
Stable in the office today, CMP reviewed.  Continue current regimen.

## 2019-05-14 NOTE — Assessment & Plan Note (Signed)
Overall stable, using methocarbamol and diclofenac PRN. Recent renal function unremarkable.

## 2019-05-14 NOTE — Assessment & Plan Note (Signed)
Tetanus and pneumonia vaccination UTD. Rx printed for Shingrix. Influenza vaccination provided today. PSA UTD. Colonoscopy UTD. Encouraged a healthy diet, regular exercise. Exam today unremarkable. Labs reviewed.

## 2019-05-14 NOTE — Assessment & Plan Note (Signed)
Recent B12 level improving. Compliant to daily oral B12. Continue same.

## 2019-05-17 DIAGNOSIS — M5013 Cervical disc disorder with radiculopathy, cervicothoracic region: Secondary | ICD-10-CM | POA: Diagnosis not present

## 2019-05-21 ENCOUNTER — Other Ambulatory Visit: Payer: Self-pay | Admitting: Primary Care

## 2019-05-21 DIAGNOSIS — M542 Cervicalgia: Secondary | ICD-10-CM

## 2019-05-21 DIAGNOSIS — G8929 Other chronic pain: Secondary | ICD-10-CM

## 2019-05-21 DIAGNOSIS — M5013 Cervical disc disorder with radiculopathy, cervicothoracic region: Secondary | ICD-10-CM | POA: Diagnosis not present

## 2019-05-21 NOTE — Telephone Encounter (Signed)
Last prescribed on 04/08/2019 . Last appointment on  05/14/2019. Next future appointment on 11/16/2019

## 2019-05-21 NOTE — Telephone Encounter (Signed)
Refill sent to pharmacy.   

## 2019-05-25 ENCOUNTER — Other Ambulatory Visit: Payer: Self-pay

## 2019-05-25 DIAGNOSIS — Z79899 Other long term (current) drug therapy: Secondary | ICD-10-CM | POA: Diagnosis not present

## 2019-05-25 DIAGNOSIS — M6530 Trigger finger, unspecified finger: Secondary | ICD-10-CM | POA: Diagnosis not present

## 2019-05-25 DIAGNOSIS — N189 Chronic kidney disease, unspecified: Secondary | ICD-10-CM | POA: Diagnosis not present

## 2019-05-25 DIAGNOSIS — E1122 Type 2 diabetes mellitus with diabetic chronic kidney disease: Secondary | ICD-10-CM | POA: Diagnosis not present

## 2019-05-25 DIAGNOSIS — F17210 Nicotine dependence, cigarettes, uncomplicated: Secondary | ICD-10-CM | POA: Diagnosis not present

## 2019-05-25 DIAGNOSIS — I129 Hypertensive chronic kidney disease with stage 1 through stage 4 chronic kidney disease, or unspecified chronic kidney disease: Secondary | ICD-10-CM | POA: Insufficient documentation

## 2019-05-25 DIAGNOSIS — M25512 Pain in left shoulder: Secondary | ICD-10-CM | POA: Diagnosis present

## 2019-05-25 DIAGNOSIS — Z7984 Long term (current) use of oral hypoglycemic drugs: Secondary | ICD-10-CM | POA: Diagnosis not present

## 2019-05-25 DIAGNOSIS — R69 Illness, unspecified: Secondary | ICD-10-CM | POA: Diagnosis not present

## 2019-05-26 ENCOUNTER — Emergency Department (HOSPITAL_COMMUNITY)
Admission: EM | Admit: 2019-05-26 | Discharge: 2019-05-26 | Disposition: A | Discharge status: Home / Self Care | Payer: Medicare HMO | Attending: Emergency Medicine | Admitting: Emergency Medicine

## 2019-05-26 ENCOUNTER — Encounter (HOSPITAL_COMMUNITY): Payer: Self-pay

## 2019-05-26 DIAGNOSIS — M6530 Trigger finger, unspecified finger: Secondary | ICD-10-CM | POA: Diagnosis not present

## 2019-05-26 DIAGNOSIS — M25512 Pain in left shoulder: Secondary | ICD-10-CM

## 2019-05-26 MED ORDER — LIDOCAINE 5 % EX PTCH
1.0000 | MEDICATED_PATCH | CUTANEOUS | Status: DC
  Administered 2019-05-26: 1 via TRANSDERMAL
  Filled 2019-05-26: qty 1

## 2019-05-26 MED ORDER — LIDOCAINE 4 % EX PTCH: 1.0000 | MEDICATED_PATCH | Freq: Two times a day (BID) | CUTANEOUS | 0 refills | Status: DC | PRN

## 2019-05-26 NOTE — ED Triage Notes (Signed)
Pt complains of shoulder and arm pain for two weeks, no recent injury, states he sometimes has tingling in his hands, has been seen for the same before and is suppose to have an orthopedic appt soon

## 2019-05-26 NOTE — ED Provider Notes (Signed)
[facility] DEPT Provider Note   CSN: [id number] Arrival date & time: 05/25/19  2343     History No chief complaint on file.   Kenneth [name] is a 64 y.o. male.  Presents emergency department chief complaint of right shoulder blade pain.  Patient states has been going on for about 2 to 3 months.  He has seen a physical medicine specialist, physical therapist and orthopedist.  He states that he was getting trigger point therapy with one of the physical therapist and it seemed to do much better for a while but then came back.  He is not taking any medications for pain.  Pain is worse when he moves his arm.  He feels a sensation of numbness or pain radiating down the arm when pressure is applied to these areas of pain in his shoulder blade region.  He denies any weakness numbness or tingling of the upper extremities when pressure is not applied to these painful areas in the shoulder blade region.  He denies any new injuries.  HPI     Past Medical History:  Diagnosis Date  . Anxiety and depression   . Bipolar 1 disorder ([place])   . Blood transfusion without reported diagnosis   . Chronic kidney disease   . Depression   . GERD (gastroesophageal reflux disease)   . Hypercholesterolemia   . Hypertension   . Sleep apnea   . Type 2 diabetes mellitus [facility])     Patient Active Problem List   Diagnosis Date Noted  . Acute thoracic back pain 05/14/2019  . Lower extremity numbness 04/17/2018  . Rhinorrhea 04/08/2018  . Vitamin B 12 deficiency 07/28/2017  . Tobacco abuse 05/05/2017  . Preventative health care 04/24/2017  . Numbness and tingling 04/15/2017  . OSA (obstructive sleep apnea) 02/07/2017  . Epigastric abdominal tenderness without rebound tenderness 09/10/2016  . GERD without esophagitis 09/10/2016  . Nonspecific abnormal electrocardiogram (ECG) (EKG) 09/10/2016  . Lower extremity edema 08/12/2016  . Type 2 diabetes mellitus without complication,  without long-term current use of insulin ([place]) 06/26/2016  . Anxiety and depression 06/26/2016  . Hypertension 06/26/2016  . Hyperlipidemia 06/26/2016  . Chronic neck pain 06/26/2016    Past Surgical History:  Procedure Laterality Date  . L hand surgery    . R knee arthoroscopy    . SPLENECTOMY         Family History  Problem Relation Age of Onset  . Hypertension Mother   . Mental illness Mother   . Diabetes Mother   . Hypertension Father   . Lung cancer Father   . Diabetes Paternal Grandmother   . Colon cancer Maternal Uncle   . Esophageal cancer Neg Hx   . Liver cancer Neg Hx   . Pancreatic cancer Neg Hx   . Rectal cancer Neg Hx   . Stomach cancer Neg Hx     Social History   Tobacco Use  . Smoking status: Current Every Day Smoker    Packs/day: 1.00    Years: 41.00    Pack years: 41.00    Types: Cigarettes  . Smokeless tobacco: Never Used  Substance Use Topics  . Alcohol use: No  . Drug use: No    Home Medications Prior to Admission medications   Medication Sig Start Date End Date Taking? Authorizing Provider  Accu-Chek FastClix Lancets MISC USE TO CHECK BLOOD SUGAR UP TO 4 TIMES A DAY 04/16/19   Pleas Koch, NP  ACCU-CHEK GUIDE  test strip USE TO CHECK UP TO 4 TIMES A DAY 04/16/19   Pleas Koch, NP  Ascorbic Acid (VITAMIN C) 1000 MG tablet Take 1,000 mg by mouth daily.    [provider]  atenolol (TENORMIN) 50 MG tablet TAKE 1 TABLET BY MOUTH EVERY DAY 04/19/19   Pleas Koch, NP  atorvastatin (LIPITOR) 20 MG tablet Take 1 tablet (20 mg total) by mouth every evening. 05/08/18   Pleas Koch, NP  blood glucose meter kit and supplies KIT Dispense based on patient and insurance preference. Use up to four times daily as directed. (FOR ICD-9 250.00, 250.01). 11/18/18   Pleas Koch, NP  Cyanocobalamin (VITAMIN B-12 PO) Take 1 tablet by mouth daily.    [provider]  diclofenac (VOLTAREN) 75 MG EC tablet TAKE 1 TABLET  BY MOUTH EVERY DAY 04/08/19   Pleas Koch, NP  fluticasone [facility]) 50 MCG/ACT nasal spray SPRAY 2 SPRAYS INTO EACH NOSTRIL EVERY DAY 01/27/19   Pleas Koch, NP  GARLIC PO Take 1 tablet by mouth daily.     [provider]  Ginseng (GIN-ZING PO) Take 1 capsule by mouth daily.     [provider]  hydrochlorothiazide (HYDRODIURIL) 25 MG tablet TAKE 1 TABLET BY MOUTH EVERY DAY 12/21/18   Pleas Koch, NP  Lidocaine 4 % PTCH Apply 1 patch topically every 12 (twelve) hours as needed. 05/26/19   Kynzlie Hilleary, Vernie Shanks, PA-C  lisinopril (PRINIVIL,ZESTRIL) 40 MG tablet Take 1 tablet (40 mg total) by mouth daily. 05/08/18   Pleas Koch, NP  metFORMIN (GLUCOPHAGE) 1000 MG tablet TAKE 1 TABLET (1,000 MG TOTAL) BY MOUTH 2 (TWO) TIMES DAILY WITH A MEAL. FOR DIABETES. 03/18/19   Pleas Koch, NP  methocarbamol (ROBAXIN) 500 MG tablet TAKE 1 TABLET (500 MG TOTAL) BY MOUTH DAILY AS NEEDED FOR MUSCLE SPASMS. 05/21/19   Pleas Koch, NP  Multiple Vitamins-Minerals (MULTIVITAMIN ADULT) TABS Take 1 tablet by mouth daily.    [provider]  Omega-3 Fatty Acids (OMEGA 3 PO) Take 1 capsule by mouth daily.    [provider]  sertraline (ZOLOFT) 50 MG tablet TAKE 1 TABLET BY MOUTH EVERY DAY 12/08/18   Pleas Koch, NP    Allergies    Patient has no known allergies.  Review of Systems   Review of Systems  Respiratory: Negative for chest tightness.   Cardiovascular: Negative for chest pain.  Musculoskeletal: Positive for myalgias.  Neurological: Negative for weakness and numbness.     Physical Exam Updated Vital Signs BP (!) 166/106 (BP Location: Left Arm)   Pulse 76   Temp 97.9 F (36.6 C) (Oral)   Resp 17   Ht _0  (1.727 m)   Wt 102.1 kg   SpO2 96%   BMI 34.21 kg/m   Physical Exam Vitals and nursing note reviewed.  Constitutional:      General: He is not in acute distress.    Appearance: He is well-developed. He is not  diaphoretic.  HENT:     Head: Normocephalic and atraumatic.  Eyes:     General: No scleral icterus.    Conjunctiva/sclera: Conjunctivae normal.  Cardiovascular:     Rate and Rhythm: Normal rate and regular rhythm.     Heart sounds: Normal heart sounds.  Pulmonary:     Effort: Pulmonary effort is normal. No respiratory distress.     Breath sounds: Normal breath sounds.  Abdominal:     Palpations: Abdomen  is soft.     Tenderness: There is no abdominal tenderness.  Musculoskeletal:     Cervical back: Normal range of motion and neck supple.     Comments: Multiple hyper irritable foci within the left trapezius and shoulder blade region.  Palpation produces significant pain and reproducible radiation down the arm. Normal bilateral upper extremity strength and sensation, negative Hoffman's test  Skin:    General: Skin is warm and dry.  Neurological:     Mental Status: He is alert.  Psychiatric:        Behavior: Behavior normal.     ED Results / Procedures / Treatments   Labs (all labs ordered are listed, but only abnormal results are displayed) Labs Reviewed - No data to display  EKG None  Radiology No results found.  Procedures Procedures (including critical care time)  Medications Ordered in ED Medications  lidocaine (LIDODERM) 5 % 1 patch (1 patch Transdermal Patch Applied 05/26/19 0150)    ED Course  I have reviewed the triage vital signs and the nursing notes.  Pertinent labs & imaging results that were available during my care of the patient were reviewed by me and considered in my medical decision making (see chart for details).    MDM Rules/Calculators/A&P                      Patient with trigger points of the upper extremity and the left shoulder blade region. He has no evidence of radiculopathy or myelopathy.  Patient can continue to follow-up with his outpatient team.  I have ordered a Lidoderm patch.  He may use over-the-counter 4% lidocaine patches.  No  evidence of emergent cause of his pain this evening.  Appears appropriate for discharge at this time. Final Clinical Impression(s) / ED Diagnoses Final diagnoses:  Trigger point of shoulder region, left    Rx / DC Orders ED Discharge Orders         Ordered    Lidocaine 4 % PTCH  Every 12 hours PRN     05/26/19 0159           Margarita Mail, PA-C 05/26/19 0210    Ripley Fraise, MD 05/26/19 [phone]

## 2019-05-26 NOTE — Discharge Instructions (Signed)
Follow up closely with your doctors as we discussed. Return to the ER if you have new weakness in your arm

## 2019-05-28 DIAGNOSIS — M542 Cervicalgia: Secondary | ICD-10-CM | POA: Diagnosis not present

## 2019-05-31 DIAGNOSIS — M542 Cervicalgia: Secondary | ICD-10-CM | POA: Diagnosis not present

## 2019-05-31 DIAGNOSIS — M4722 Other spondylosis with radiculopathy, cervical region: Secondary | ICD-10-CM | POA: Diagnosis not present

## 2019-06-01 DIAGNOSIS — L728 Other follicular cysts of the skin and subcutaneous tissue: Secondary | ICD-10-CM | POA: Diagnosis not present

## 2019-06-01 DIAGNOSIS — D0462 Carcinoma in situ of skin of left upper limb, including shoulder: Secondary | ICD-10-CM | POA: Diagnosis not present

## 2019-06-05 ENCOUNTER — Other Ambulatory Visit: Payer: Self-pay | Admitting: Primary Care

## 2019-06-05 DIAGNOSIS — G8929 Other chronic pain: Secondary | ICD-10-CM

## 2019-06-07 DIAGNOSIS — M9903 Segmental and somatic dysfunction of lumbar region: Secondary | ICD-10-CM | POA: Diagnosis not present

## 2019-06-07 DIAGNOSIS — M50122 Cervical disc disorder at C5-C6 level with radiculopathy: Secondary | ICD-10-CM | POA: Diagnosis not present

## 2019-06-07 DIAGNOSIS — M546 Pain in thoracic spine: Secondary | ICD-10-CM | POA: Diagnosis not present

## 2019-06-07 DIAGNOSIS — M62830 Muscle spasm of back: Secondary | ICD-10-CM | POA: Diagnosis not present

## 2019-06-08 DIAGNOSIS — M9903 Segmental and somatic dysfunction of lumbar region: Secondary | ICD-10-CM | POA: Diagnosis not present

## 2019-06-08 DIAGNOSIS — M50122 Cervical disc disorder at C5-C6 level with radiculopathy: Secondary | ICD-10-CM | POA: Diagnosis not present

## 2019-06-08 DIAGNOSIS — M62830 Muscle spasm of back: Secondary | ICD-10-CM | POA: Diagnosis not present

## 2019-06-08 DIAGNOSIS — M546 Pain in thoracic spine: Secondary | ICD-10-CM | POA: Diagnosis not present

## 2019-06-08 NOTE — Telephone Encounter (Signed)
Ok to refill? Last prescribed on 04/08/2019 . Last appointment on 05/14/2019 (CPE) . Next future appointment on 11/16/2019.

## 2019-06-08 NOTE — Telephone Encounter (Signed)
Refill sent to pharmacy.   

## 2019-06-10 DIAGNOSIS — M50122 Cervical disc disorder at C5-C6 level with radiculopathy: Secondary | ICD-10-CM | POA: Diagnosis not present

## 2019-06-10 DIAGNOSIS — M9903 Segmental and somatic dysfunction of lumbar region: Secondary | ICD-10-CM | POA: Diagnosis not present

## 2019-06-10 DIAGNOSIS — M546 Pain in thoracic spine: Secondary | ICD-10-CM | POA: Diagnosis not present

## 2019-06-14 ENCOUNTER — Other Ambulatory Visit: Payer: Self-pay | Admitting: Primary Care

## 2019-06-14 DIAGNOSIS — M9903 Segmental and somatic dysfunction of lumbar region: Secondary | ICD-10-CM | POA: Diagnosis not present

## 2019-06-14 DIAGNOSIS — M546 Pain in thoracic spine: Secondary | ICD-10-CM | POA: Diagnosis not present

## 2019-06-14 DIAGNOSIS — M50122 Cervical disc disorder at C5-C6 level with radiculopathy: Secondary | ICD-10-CM | POA: Diagnosis not present

## 2019-06-14 DIAGNOSIS — G8929 Other chronic pain: Secondary | ICD-10-CM

## 2019-06-14 DIAGNOSIS — M542 Cervicalgia: Secondary | ICD-10-CM

## 2019-06-15 ENCOUNTER — Other Ambulatory Visit: Payer: Self-pay | Admitting: Primary Care

## 2019-06-15 DIAGNOSIS — M542 Cervicalgia: Secondary | ICD-10-CM

## 2019-06-15 DIAGNOSIS — M9903 Segmental and somatic dysfunction of lumbar region: Secondary | ICD-10-CM | POA: Diagnosis not present

## 2019-06-15 DIAGNOSIS — G8929 Other chronic pain: Secondary | ICD-10-CM

## 2019-06-15 DIAGNOSIS — M546 Pain in thoracic spine: Secondary | ICD-10-CM | POA: Diagnosis not present

## 2019-06-15 DIAGNOSIS — M50122 Cervical disc disorder at C5-C6 level with radiculopathy: Secondary | ICD-10-CM | POA: Diagnosis not present

## 2019-06-21 DIAGNOSIS — M546 Pain in thoracic spine: Secondary | ICD-10-CM | POA: Diagnosis not present

## 2019-06-21 DIAGNOSIS — M50122 Cervical disc disorder at C5-C6 level with radiculopathy: Secondary | ICD-10-CM | POA: Diagnosis not present

## 2019-06-21 DIAGNOSIS — M9903 Segmental and somatic dysfunction of lumbar region: Secondary | ICD-10-CM | POA: Diagnosis not present

## 2019-06-22 DIAGNOSIS — M9903 Segmental and somatic dysfunction of lumbar region: Secondary | ICD-10-CM | POA: Diagnosis not present

## 2019-06-22 DIAGNOSIS — M50122 Cervical disc disorder at C5-C6 level with radiculopathy: Secondary | ICD-10-CM | POA: Diagnosis not present

## 2019-06-22 DIAGNOSIS — M546 Pain in thoracic spine: Secondary | ICD-10-CM | POA: Diagnosis not present

## 2019-06-24 DIAGNOSIS — M50122 Cervical disc disorder at C5-C6 level with radiculopathy: Secondary | ICD-10-CM | POA: Diagnosis not present

## 2019-06-24 DIAGNOSIS — M546 Pain in thoracic spine: Secondary | ICD-10-CM | POA: Diagnosis not present

## 2019-06-24 DIAGNOSIS — M9903 Segmental and somatic dysfunction of lumbar region: Secondary | ICD-10-CM | POA: Diagnosis not present

## 2019-06-25 DIAGNOSIS — M9903 Segmental and somatic dysfunction of lumbar region: Secondary | ICD-10-CM | POA: Diagnosis not present

## 2019-06-25 DIAGNOSIS — M50122 Cervical disc disorder at C5-C6 level with radiculopathy: Secondary | ICD-10-CM | POA: Diagnosis not present

## 2019-06-25 DIAGNOSIS — M546 Pain in thoracic spine: Secondary | ICD-10-CM | POA: Diagnosis not present

## 2019-06-29 DIAGNOSIS — M5412 Radiculopathy, cervical region: Secondary | ICD-10-CM | POA: Diagnosis not present

## 2019-07-20 DIAGNOSIS — Z08 Encounter for follow-up examination after completed treatment for malignant neoplasm: Secondary | ICD-10-CM | POA: Diagnosis not present

## 2019-07-20 DIAGNOSIS — Z85828 Personal history of other malignant neoplasm of skin: Secondary | ICD-10-CM | POA: Diagnosis not present

## 2019-07-20 DIAGNOSIS — M50122 Cervical disc disorder at C5-C6 level with radiculopathy: Secondary | ICD-10-CM | POA: Diagnosis not present

## 2019-07-20 DIAGNOSIS — M9903 Segmental and somatic dysfunction of lumbar region: Secondary | ICD-10-CM | POA: Diagnosis not present

## 2019-07-20 DIAGNOSIS — T300 Burn of unspecified body region, unspecified degree: Secondary | ICD-10-CM | POA: Diagnosis not present

## 2019-07-20 DIAGNOSIS — M546 Pain in thoracic spine: Secondary | ICD-10-CM | POA: Diagnosis not present

## 2019-07-20 DIAGNOSIS — L918 Other hypertrophic disorders of the skin: Secondary | ICD-10-CM | POA: Diagnosis not present

## 2019-07-22 DIAGNOSIS — M50122 Cervical disc disorder at C5-C6 level with radiculopathy: Secondary | ICD-10-CM | POA: Diagnosis not present

## 2019-07-22 DIAGNOSIS — M546 Pain in thoracic spine: Secondary | ICD-10-CM | POA: Diagnosis not present

## 2019-07-22 DIAGNOSIS — M9903 Segmental and somatic dysfunction of lumbar region: Secondary | ICD-10-CM | POA: Diagnosis not present

## 2019-07-23 ENCOUNTER — Other Ambulatory Visit: Payer: Self-pay | Admitting: Primary Care

## 2019-07-23 DIAGNOSIS — G8929 Other chronic pain: Secondary | ICD-10-CM

## 2019-07-23 NOTE — Telephone Encounter (Signed)
Last prescribed on 06/08/2019 . Last appointment on 05/14/2019 (CPE) . Next future appointment on 11/16/2019.

## 2019-07-23 NOTE — Telephone Encounter (Signed)
Noted, refill provided 

## 2019-07-26 DIAGNOSIS — M546 Pain in thoracic spine: Secondary | ICD-10-CM | POA: Diagnosis not present

## 2019-07-26 DIAGNOSIS — M50122 Cervical disc disorder at C5-C6 level with radiculopathy: Secondary | ICD-10-CM | POA: Diagnosis not present

## 2019-07-26 DIAGNOSIS — M9903 Segmental and somatic dysfunction of lumbar region: Secondary | ICD-10-CM | POA: Diagnosis not present

## 2019-09-15 DIAGNOSIS — R69 Illness, unspecified: Secondary | ICD-10-CM | POA: Diagnosis not present

## 2019-09-21 ENCOUNTER — Encounter: Payer: Self-pay | Admitting: Podiatry

## 2019-09-21 ENCOUNTER — Other Ambulatory Visit: Payer: Self-pay

## 2019-09-21 ENCOUNTER — Other Ambulatory Visit: Payer: Self-pay | Admitting: Podiatry

## 2019-09-21 ENCOUNTER — Ambulatory Visit (INDEPENDENT_AMBULATORY_CARE_PROVIDER_SITE_OTHER): Payer: Medicare HMO

## 2019-09-21 ENCOUNTER — Ambulatory Visit: Payer: Medicare HMO | Admitting: Podiatry

## 2019-09-21 DIAGNOSIS — S90212A Contusion of left great toe with damage to nail, initial encounter: Secondary | ICD-10-CM

## 2019-09-21 DIAGNOSIS — S90112A Contusion of left great toe without damage to nail, initial encounter: Secondary | ICD-10-CM

## 2019-09-21 MED ORDER — AMOXICILLIN-POT CLAVULANATE 875-125 MG PO TABS: 1.0000 | ORAL_TABLET | Freq: Two times a day (BID) | ORAL | 0 refills | Status: DC

## 2019-09-21 NOTE — Patient Instructions (Signed)

## 2019-09-21 NOTE — Progress Notes (Signed)
Subjective:  Patient ID: Kenneth [name], male    DOB: [birth year],  MRN: [id number] HPI Chief Complaint  Patient presents with  . Toe Injury    Hallux left - dropped car battery on toe Saturday (4 days ago), large blistered area at base of toenail, very sore  . Diabetes    Last a1c was 6.1  . New Patient (Initial Visit)    64 y.o. male presents with the above complaint.   ROS: Denies fever chills nausea vomiting muscle aches pains calf pain back pain chest pain shortness of breath.  Past Medical History:  Diagnosis Date  . Anxiety and depression   . Bipolar 1 disorder (Kenneth [name])   . Blood transfusion without reported diagnosis   . Chronic kidney disease   . Depression   . GERD (gastroesophageal reflux disease)   . Hypercholesterolemia   . Hypertension   . Sleep apnea   . Type 2 diabetes mellitus ([name])    Past Surgical History:  Procedure Laterality Date  . L hand surgery    . R knee arthoroscopy    . SPLENECTOMY      Current Outpatient Medications:  .  Accu-Chek FastClix Lancets MISC, USE TO CHECK BLOOD SUGAR UP TO 4 TIMES A DAY, Disp: 100 each, Rfl: 3 .  ACCU-CHEK GUIDE test strip, USE TO CHECK UP TO 4 TIMES A DAY, Disp: 100 strip, Rfl: 3 .  amoxicillin-clavulanate (AUGMENTIN) 875-125 MG tablet, Take 1 tablet by mouth 2 (two) times daily., Disp: 20 tablet, Rfl: 0 .  Ascorbic Acid (VITAMIN C) 1000 MG tablet, Take 1,000 mg by mouth daily., Disp: , Rfl:  .  atenolol (TENORMIN) 50 MG tablet, TAKE 1 TABLET BY MOUTH EVERY DAY, Disp: 90 tablet, Rfl: 1 .  atorvastatin (LIPITOR) 20 MG tablet, Take 1 tablet (20 mg total) by mouth every evening., Disp: 90 tablet, Rfl: 3 .  blood glucose meter kit and supplies KIT, Dispense based on patient and insurance preference. Use up to four times daily as directed. (FOR ICD-9 250.00, 250.01)., Disp: 1 each, Rfl: 0 .  Cyanocobalamin (VITAMIN B-12 PO), Take 1 tablet by mouth daily., Disp: , Rfl:  .  diclofenac (VOLTAREN) 75 MG EC tablet, TAKE 1  TABLET (75 MG TOTAL) BY MOUTH DAILY AS NEEDED FOR MODERATE PAIN., Disp: 30 tablet, Rfl: 0 .  fluticasone (FLONASE) 50 MCG/ACT nasal spray, SPRAY 2 SPRAYS INTO EACH NOSTRIL EVERY DAY, Disp: 16 mL, Rfl: 3 .  GARLIC PO, Take 1 tablet by mouth daily. , Disp: , Rfl:  .  Ginseng (GIN-ZING PO), Take 1 capsule by mouth daily. , Disp: , Rfl:  .  hydrochlorothiazide (HYDRODIURIL) 25 MG tablet, TAKE 1 TABLET BY MOUTH EVERY DAY, Disp: 90 tablet, Rfl: 1 .  lisinopril (PRINIVIL,ZESTRIL) 40 MG tablet, Take 1 tablet (40 mg total) by mouth daily., Disp: 90 tablet, Rfl: 3 .  metFORMIN (GLUCOPHAGE) 1000 MG tablet, TAKE 1 TABLET (1,000 MG TOTAL) BY MOUTH 2 (TWO) TIMES DAILY WITH A MEAL. FOR DIABETES., Disp: 180 tablet, Rfl: 1 .  Multiple Vitamins-Minerals (MULTIVITAMIN ADULT) TABS, Take 1 tablet by mouth daily., Disp: , Rfl:  .  Omega-3 Fatty Acids (OMEGA 3 PO), Take 1 capsule by mouth daily., Disp: , Rfl:  .  sertraline (ZOLOFT) 50 MG tablet, TAKE 1 TABLET BY MOUTH EVERY DAY, Disp: 90 tablet, Rfl: 1 .  SHINGRIX injection, , Disp: , Rfl:   Current Facility-Administered Medications:  .  0.9 %  sodium chloride infusion, 500 mL, Intravenous, Once,  Kenneth Artist, MD  No Known Allergies Review of Systems Objective:  There were no vitals filed for this visit.  General: Well developed, nourished, in no acute distress, alert and oriented x3   Dermatological: Skin is warm, dry and supple bilateral. Nails x 10 are well maintained; remaining integument appears unremarkable at this time. There are no open sores, no preulcerative lesions, no rash or signs of infection present.  A large bolus just proximal to the nail fold extending around to the lateral nail fold and the toenail appears to be floating.  There is no laceration.  Vascular: Dorsalis Pedis artery and Posterior Tibial artery pedal pulses are 2/4 bilateral with immedate capillary fill time. Pedal hair growth present. No varicosities and no lower extremity edema  present bilateral.   Neruologic: Grossly intact via light touch bilateral. Vibratory intact via tuning fork bilateral. Protective threshold with Semmes Wienstein monofilament intact to all pedal sites bilateral. Patellar and Achilles deep tendon reflexes 2+ bilateral. No Babinski or clonus noted bilateral.   Musculoskeletal: No gross boney pedal deformities bilateral. No pain, crepitus, or limitation noted with foot and ankle range of motion bilateral. Muscular strength 5/5 in all groups tested bilateral.  Extensor tendons to the toe itself are functional as are the plantar flexor tendons.  Gait: Unassisted, Nonantalgic.    Radiographs:  Radiographs demonstrate only soft tissue swelling overlying the distalmost aspect of the proximal phalanx but no cortical interruptions are identified.  Assessment & Plan:   Assessment: Subungual hematoma and bolus from trauma hallux left.  Plan: Nail avulsion and evacuation of the hematoma this was performed today after local anesthetic was administered there is no signs of fracture.  There is no signs of infection that we are going to go ahead and start him on Augmentin 875 1 p.o. twice daily I will follow-up with him in 1 to 2 weeks.     Kenneth [name], [place]

## 2019-09-30 ENCOUNTER — Ambulatory Visit (INDEPENDENT_AMBULATORY_CARE_PROVIDER_SITE_OTHER): Payer: Medicare HMO | Admitting: Podiatry

## 2019-09-30 ENCOUNTER — Other Ambulatory Visit: Payer: Self-pay

## 2019-09-30 ENCOUNTER — Encounter: Payer: Self-pay | Admitting: Podiatry

## 2019-09-30 DIAGNOSIS — S90212A Contusion of left great toe with damage to nail, initial encounter: Secondary | ICD-10-CM

## 2019-09-30 DIAGNOSIS — Z9889 Other specified postprocedural states: Secondary | ICD-10-CM

## 2019-09-30 NOTE — Progress Notes (Signed)
He presents today for follow-up of his nail avulsion hallux left.  States that is doing much better he is very happy with the outcome thus far.  Objective: Vital signs are stable he is alert oriented x3 there is no erythema edema cellulitis drainage or odor his nails been has almost healed completely.  There is a very small laceration just near the tip of the toe that has gone on to heal but otherwise there is no purulence no signs of infection and the large boluses gone on to dry up after being drained.  Assessment: Well-healing surgical toe.  Plan: Continue to soak for the next day or so just until the end of the toe heals up all the way.  I will follow-up with him in about 2 weeks if necessary otherwise.Marland Kitchen

## 2019-10-06 DIAGNOSIS — K1370 Unspecified lesions of oral mucosa: Secondary | ICD-10-CM | POA: Diagnosis not present

## 2019-10-20 DIAGNOSIS — R29898 Other symptoms and signs involving the musculoskeletal system: Secondary | ICD-10-CM | POA: Diagnosis not present

## 2019-10-23 ENCOUNTER — Other Ambulatory Visit: Payer: Self-pay | Admitting: Primary Care

## 2019-10-23 DIAGNOSIS — F329 Major depressive disorder, single episode, unspecified: Secondary | ICD-10-CM | POA: Diagnosis not present

## 2019-10-23 DIAGNOSIS — I10 Essential (primary) hypertension: Secondary | ICD-10-CM | POA: Diagnosis not present

## 2019-10-23 DIAGNOSIS — M542 Cervicalgia: Secondary | ICD-10-CM

## 2019-10-23 DIAGNOSIS — R69 Illness, unspecified: Secondary | ICD-10-CM | POA: Diagnosis not present

## 2019-10-23 DIAGNOSIS — E785 Hyperlipidemia, unspecified: Secondary | ICD-10-CM | POA: Diagnosis not present

## 2019-10-23 DIAGNOSIS — M62838 Other muscle spasm: Secondary | ICD-10-CM | POA: Diagnosis not present

## 2019-10-23 DIAGNOSIS — E669 Obesity, unspecified: Secondary | ICD-10-CM | POA: Diagnosis not present

## 2019-10-23 DIAGNOSIS — F419 Anxiety disorder, unspecified: Secondary | ICD-10-CM | POA: Diagnosis not present

## 2019-10-23 DIAGNOSIS — G8929 Other chronic pain: Secondary | ICD-10-CM

## 2019-10-23 DIAGNOSIS — J309 Allergic rhinitis, unspecified: Secondary | ICD-10-CM | POA: Diagnosis not present

## 2019-10-23 DIAGNOSIS — E119 Type 2 diabetes mellitus without complications: Secondary | ICD-10-CM | POA: Diagnosis not present

## 2019-11-02 ENCOUNTER — Telehealth: Payer: Self-pay | Admitting: Primary Care

## 2019-11-02 NOTE — Telephone Encounter (Signed)
Received renewal for disability placard. It was place the Rx tower. Will place in [place]

## 2019-11-03 NOTE — Telephone Encounter (Signed)
Why is he requesting a handicap placard?

## 2019-11-03 NOTE — Telephone Encounter (Signed)
Spoken to patient and he stated that he was not sure who to bring this to. Ortho told him to ask pcp. Patient stated that he was given this at prior pcp at [facility]. Patient stated that he cannot walk for several feet then his legs sometimes feel numb or gets painful.

## 2019-11-03 NOTE — Telephone Encounter (Signed)
Completed and placed in [place] inbox. 

## 2019-11-03 NOTE — Telephone Encounter (Signed)
Message left for patient to return my call.  

## 2019-11-04 NOTE — Telephone Encounter (Signed)
Message left for patient to return my call.  

## 2019-11-04 NOTE — Telephone Encounter (Signed)
Left form in the front office for patient to pick up

## 2019-11-12 ENCOUNTER — Other Ambulatory Visit: Payer: Self-pay | Admitting: Primary Care

## 2019-11-12 DIAGNOSIS — E785 Hyperlipidemia, unspecified: Secondary | ICD-10-CM

## 2019-11-15 NOTE — Telephone Encounter (Signed)
Refill sent. Sorry did not mean to route to you, Shirlean Mylar

## 2019-11-15 NOTE — Telephone Encounter (Signed)
Pt called checking on rx Pt is out of meds  Best number [phone]

## 2019-11-16 ENCOUNTER — Ambulatory Visit: Payer: Medicare HMO | Admitting: Primary Care

## 2019-11-17 ENCOUNTER — Ambulatory Visit (INDEPENDENT_AMBULATORY_CARE_PROVIDER_SITE_OTHER): Payer: Medicare HMO | Admitting: Primary Care

## 2019-11-17 ENCOUNTER — Encounter: Payer: Self-pay | Admitting: Primary Care

## 2019-11-17 ENCOUNTER — Other Ambulatory Visit: Payer: Self-pay

## 2019-11-17 VITALS — BP 128/86 | HR 72 | Temp 96.2°F | Ht 67.5 in | Wt 223.0 lb

## 2019-11-17 DIAGNOSIS — E119 Type 2 diabetes mellitus without complications: Secondary | ICD-10-CM | POA: Diagnosis not present

## 2019-11-17 DIAGNOSIS — E538 Deficiency of other specified B group vitamins: Secondary | ICD-10-CM | POA: Diagnosis not present

## 2019-11-17 LAB — POCT GLYCOSYLATED HEMOGLOBIN (HGB A1C): Hemoglobin A1C: 5.9 % — AB (ref 4.0–5.6)

## 2019-11-17 MED ORDER — METFORMIN HCL 500 MG PO TABS: 500.0000 mg | ORAL_TABLET | Freq: Two times a day (BID) | ORAL | 3 refills | Status: DC

## 2019-11-17 NOTE — Assessment & Plan Note (Signed)
Recent A1C level of 5.9 which is under great control on Metformin 1000 mg BID.   Commended himself on dietary changes, encouraged regular exercise.   He's been very stable over the last 1+ year, below diabetes diagnosis range. Due to this we will reduce Metformin to 500 BID.   Follow up in 6 months.

## 2019-11-17 NOTE — Patient Instructions (Signed)
Stop by the lab prior to leaving today. I will notify you of your results once received.   Continue taking oral B12 1000 mcg once daily for B12.  We've reduced your dose of Metformin to 500 mg twice daily for diabetes.   Please schedule a physical to meet with me in 6 months. You may also schedule a lab only appointment 3-4 days prior, but this is not required. We will discuss your lab results in detail during your physical.  It was a pleasure to see you today!

## 2019-11-17 NOTE — Addendum Note (Signed)
Addended by: Pleas Koch on: 11/17/2019 03:26 PM   Modules accepted: Orders

## 2019-11-17 NOTE — Assessment & Plan Note (Signed)
Repeat B12 level pending, compliant to oral B12 1000 mcg daily.

## 2019-11-17 NOTE — Progress Notes (Signed)
Subjective:    Patient ID: Kenneth [name], male    DOB: 04-Jul-1955, 64 y.o.   MRN: [id number]  HPI  This visit occurred during the SARS-CoV-2 public health emergency.  Safety protocols were in place, including screening questions prior to the visit, additional usage of staff PPE, and extensive cleaning of exam room while observing appropriate contact time as indicated for disinfecting solutions.   [name] is a 64 year old male with a history of vitamin B 12 deficiency, type 2 diabetes, hypertension, GERD, OSA, anxiety and depression, lower extremity edema, tobacco abuse who presents today for follow up.  1) Type 2 Diabetes:   Current medications include: Metformin 1000 mg BID.   He  is checking his/her blood glucose 2-3 weeks and is getting readings of:  AM fasting: low 100's  Last A1C: 6.1 in February 2021, 5.9 today. Last Eye Exam: UTD Last Foot Exam: Due Pneumonia Vaccination: Completed in 2020 ACE/ARB: ACE-I Statin: Lipitor   2) Vitamin B12 Deficiency: Last B12 level of 260 in February 2021. He is taking vitamin B 12 1000 mcg daily. No recent B12 level on file.   BP Readings from Last 3 Encounters:  11/17/19 128/86  05/26/19 (!) 155/96  05/14/19 130/86      Review of Systems  Respiratory: Negative for shortness of breath.   Cardiovascular: Negative for chest pain.  Neurological: Negative for dizziness and headaches.       Past Medical History:  Diagnosis Date  . Anxiety and depression   . Bipolar 1 disorder ([place])   . Blood transfusion without reported diagnosis   . Chronic kidney disease   . Depression   . GERD (gastroesophageal reflux disease)   . Hypercholesterolemia   . Hypertension   . Sleep apnea   . Type 2 diabetes mellitus ([place])      Social History   Socioeconomic History  . Marital status: Divorced    Spouse name: Not on file  . Number of children: Not on file  . Years of education: Not on file  . Highest education level: Not on file    Occupational History  . Not on file  Tobacco Use  . Smoking status: Current Every Day Smoker    Packs/day: 1.00    Years: 41.00    Pack years: 41.00    Types: Cigarettes  . Smokeless tobacco: Never Used  Vaping Use  . Vaping Use: Never used  Substance and Sexual Activity  . Alcohol use: No  . Drug use: No  . Sexual activity: Not on file  Other Topics Concern  . Not on file  Social History Narrative   Single.   1 daughter, 1 grandchild.   Retired. Once worked in [employer].   Enjoys sports, racing, traveling.    Social Determinants of Health   Financial Resource Strain: Low Risk   . Difficulty of Paying Living Expenses: Not hard at all  Food Insecurity: No Food Insecurity  . Worried About Charity fundraiser in the Last Year: Never true  . Ran Out of Food in the Last Year: Never true  Transportation Needs: No Transportation Needs  . Lack of Transportation (Medical): No  . Lack of Transportation (Non-Medical): No  Physical Activity: Inactive  . Days of Exercise per Week: 0 days  . Minutes of Exercise per Session: 0 min  Stress: No Stress Concern Present  . Feeling of Stress : Not at all  Social Connections:   . Frequency of Communication with  Friends and Family:   . Frequency of Social Gatherings with Friends and Family:   . Attends Religious Services:   . Active Member of Clubs or Organizations:   . Attends Archivist Meetings:   Marland Kitchen Marital Status:   Intimate Partner Violence: Not At Risk  . Fear of Current or Ex-Partner: No  . Emotionally Abused: No  . Physically Abused: No  . Sexually Abused: No    Past Surgical History:  Procedure Laterality Date  . L hand surgery    . R knee arthoroscopy    . SPLENECTOMY      Family History  Problem Relation Age of Onset  . Hypertension Mother   . Mental illness Mother   . Diabetes Mother   . Hypertension Father   . Lung cancer Father   . Diabetes Paternal Grandmother   . Colon cancer Maternal Uncle   .  Esophageal cancer Neg Hx   . Liver cancer Neg Hx   . Pancreatic cancer Neg Hx   . Rectal cancer Neg Hx   . Stomach cancer Neg Hx     No Known Allergies  Current Outpatient Medications on File Prior to Visit  Medication Sig Dispense Refill  . Accu-Chek FastClix Lancets MISC USE TO CHECK BLOOD SUGAR UP TO 4 TIMES A DAY 100 each 3  . ACCU-CHEK GUIDE test strip USE TO CHECK UP TO 4 TIMES A DAY 100 strip 3  . amoxicillin-clavulanate (AUGMENTIN) 875-125 MG tablet Take 1 tablet by mouth 2 (two) times daily. 20 tablet 0  . Ascorbic Acid (VITAMIN C) 1000 MG tablet Take 1,000 mg by mouth daily.    Marland Kitchen atenolol (TENORMIN) 50 MG tablet TAKE 1 TABLET BY MOUTH EVERY DAY 90 tablet 1  . atorvastatin (LIPITOR) 20 MG tablet TAKE 1 TABLET BY MOUTH EVERY DAY IN THE EVENING 90 tablet 0  . blood glucose meter kit and supplies KIT Dispense based on patient and insurance preference. Use up to four times daily as directed. (FOR ICD-9 250.00, 250.01). 1 each 0  . Cyanocobalamin (VITAMIN B-12 PO) Take 1 tablet by mouth daily.    . diclofenac (VOLTAREN) 75 MG EC tablet TAKE 1 TABLET (75 MG TOTAL) BY MOUTH DAILY AS NEEDED FOR MODERATE PAIN. 30 tablet 0  . fluticasone (FLONASE) 50 MCG/ACT nasal spray SPRAY 2 SPRAYS INTO EACH NOSTRIL EVERY DAY 16 mL 3  . GARLIC PO Take 1 tablet by mouth daily.     . Ginseng (GIN-ZING PO) Take 1 capsule by mouth daily.     . hydrochlorothiazide (HYDRODIURIL) 25 MG tablet TAKE 1 TABLET BY MOUTH EVERY DAY 90 tablet 1  . lisinopril (ZESTRIL) 40 MG tablet TAKE 1 TABLET BY MOUTH EVERY DAY 90 tablet 1  . methocarbamol (ROBAXIN) 500 MG tablet TAKE 1 TABLET (500 MG TOTAL) BY MOUTH DAILY AS NEEDED FOR MUSCLE SPASMS. 30 tablet 0  . Multiple Vitamins-Minerals (MULTIVITAMIN ADULT) TABS Take 1 tablet by mouth daily.    . Omega-3 Fatty Acids (OMEGA 3 PO) Take 1 capsule by mouth daily.    . sertraline (ZOLOFT) 50 MG tablet TAKE 1 TABLET BY MOUTH EVERY DAY 90 tablet 1  . SHINGRIX injection       Current Facility-Administered Medications on File Prior to Visit  Medication Dose Route Frequency Provider Last Rate Last Admin  . 0.9 %  sodium chloride infusion  500 mL Intravenous Once Ladene Artist, MD        BP 128/86   Pulse 72  Temp (!) 96.2 F (35.7 C) (Temporal)   Ht 5' 7.5" (1.715 m)   Wt 223 lb (101.2 kg)   SpO2 98%   BMI 34.41 kg/m    Objective:   Physical Exam Cardiovascular:     Rate and Rhythm: Normal rate and regular rhythm.  Pulmonary:     Effort: Pulmonary effort is normal.     Breath sounds: Normal breath sounds.  Musculoskeletal:     Cervical back: Neck supple.  Skin:    General: Skin is warm and dry.            Assessment & Plan:

## 2019-11-18 LAB — VITAMIN B12: Vitamin B-12: 134 pg/mL — ABNORMAL LOW (ref 211–911)

## 2019-11-23 ENCOUNTER — Other Ambulatory Visit: Payer: Self-pay | Admitting: Primary Care

## 2019-11-23 DIAGNOSIS — G8929 Other chronic pain: Secondary | ICD-10-CM

## 2019-11-23 NOTE — Telephone Encounter (Signed)
Refills sent to pharmacy. 

## 2019-11-23 NOTE — Telephone Encounter (Signed)
Last prescribed on 07/23/2019 Last OV (follow up) with Allie Bossier on 11/17/2019 Future OV scheduled on 05/22/2020

## 2019-12-01 ENCOUNTER — Telehealth: Payer: Self-pay | Admitting: *Deleted

## 2019-12-01 NOTE — Telephone Encounter (Signed)
I am happy with any Covid-19 vaccine that he is willing to get. Tell him this is great news!!

## 2019-12-01 NOTE — Telephone Encounter (Signed)
Patient called stating that he has decided to get the covid shot. Patient stated that he is planning on going to [place] today before 6:00 to get the vaccine. Patient stated that [facility] is only giving the [employer] vaccine. Patient wants to know if Allie Bossier NP has a preference as to what vaccine she recommends? Advised patient that [name] is with patient and not sure when she will be able to see the message. Patient requested that message still go back to her.

## 2019-12-01 NOTE — Telephone Encounter (Signed)
Patient notified as instructed by telephone and verbalized understanding. Patient stated that he plans on going today to get the vaccine.

## 2019-12-02 ENCOUNTER — Other Ambulatory Visit: Payer: Self-pay

## 2019-12-02 ENCOUNTER — Ambulatory Visit (INDEPENDENT_AMBULATORY_CARE_PROVIDER_SITE_OTHER): Payer: Medicare HMO

## 2019-12-02 DIAGNOSIS — E538 Deficiency of other specified B group vitamins: Secondary | ICD-10-CM | POA: Diagnosis not present

## 2019-12-02 MED ORDER — CYANOCOBALAMIN 1000 MCG/ML IJ SOLN
1000.0000 ug | Freq: Once | INTRAMUSCULAR | Status: AC
  Administered 2019-12-02: 1000 ug via INTRAMUSCULAR

## 2019-12-02 NOTE — Progress Notes (Signed)
Per ordered by NP Alma Friendly, b12 injection given.  Pt tolerated well.

## 2019-12-23 ENCOUNTER — Other Ambulatory Visit: Payer: Self-pay | Admitting: Primary Care

## 2019-12-23 DIAGNOSIS — G8929 Other chronic pain: Secondary | ICD-10-CM

## 2019-12-23 DIAGNOSIS — M542 Cervicalgia: Secondary | ICD-10-CM

## 2020-01-06 ENCOUNTER — Ambulatory Visit (INDEPENDENT_AMBULATORY_CARE_PROVIDER_SITE_OTHER): Payer: Medicare HMO

## 2020-01-06 ENCOUNTER — Other Ambulatory Visit: Payer: Self-pay

## 2020-01-06 DIAGNOSIS — E538 Deficiency of other specified B group vitamins: Secondary | ICD-10-CM

## 2020-01-06 MED ORDER — CYANOCOBALAMIN 1000 MCG/ML IJ SOLN
1000.0000 ug | Freq: Once | INTRAMUSCULAR | Status: AC
  Administered 2020-01-06: 1000 ug via INTRAMUSCULAR

## 2020-01-06 NOTE — Progress Notes (Addendum)
Per orders of Dr. Damita Dunnings in [place], NP absence, injection of vitamin b12 given by Kem Parkinson. Patient tolerated injection well.

## 2020-01-12 DIAGNOSIS — M62838 Other muscle spasm: Secondary | ICD-10-CM | POA: Diagnosis not present

## 2020-01-12 DIAGNOSIS — G5682 Other specified mononeuropathies of left upper limb: Secondary | ICD-10-CM | POA: Diagnosis not present

## 2020-01-12 DIAGNOSIS — Z79899 Other long term (current) drug therapy: Secondary | ICD-10-CM | POA: Diagnosis not present

## 2020-01-21 ENCOUNTER — Other Ambulatory Visit: Payer: Self-pay | Admitting: Primary Care

## 2020-01-21 DIAGNOSIS — I10 Essential (primary) hypertension: Secondary | ICD-10-CM

## 2020-01-22 ENCOUNTER — Other Ambulatory Visit: Payer: Self-pay | Admitting: Primary Care

## 2020-01-22 DIAGNOSIS — F32A Depression, unspecified: Secondary | ICD-10-CM

## 2020-01-22 DIAGNOSIS — F419 Anxiety disorder, unspecified: Secondary | ICD-10-CM

## 2020-02-01 ENCOUNTER — Other Ambulatory Visit: Payer: Self-pay | Admitting: Primary Care

## 2020-02-01 DIAGNOSIS — M542 Cervicalgia: Secondary | ICD-10-CM

## 2020-02-01 DIAGNOSIS — G8929 Other chronic pain: Secondary | ICD-10-CM

## 2020-02-01 NOTE — Telephone Encounter (Signed)
Refill sent to pharmacy.   

## 2020-02-01 NOTE — Telephone Encounter (Signed)
Please see message and advise.  Thank you. Last OV 11/17/19 Last fill 10/26/19  #30/0

## 2020-02-11 ENCOUNTER — Other Ambulatory Visit: Payer: Self-pay | Admitting: Primary Care

## 2020-02-11 DIAGNOSIS — E785 Hyperlipidemia, unspecified: Secondary | ICD-10-CM

## 2020-02-11 NOTE — Telephone Encounter (Signed)
Pharmacy requests refill on: Atorvastatin 20 mg  LAST REFILL: 11/12/2019 LAST OV: 11/17/2019 NEXT OV: 05/22/2020 PHARMACY: [id number] [place], [place]

## 2020-02-16 ENCOUNTER — Telehealth: Payer: Self-pay | Admitting: Primary Care

## 2020-02-16 NOTE — Telephone Encounter (Signed)
Pt called in wanted to know about the continuation of the b-12 injections, didn't know if he needs to continue it or stop

## 2020-02-16 NOTE — Telephone Encounter (Signed)
Per my result note in August, it looks like we contacted him with the information below, he verbalized understanding:  Continue B12 injections x 6 months (From August 2021) with repeat B12 lab in 4 months (From August). This means that he will continue B12 injections for now, just need repeat B12 level again in December 2021.

## 2020-02-16 NOTE — Telephone Encounter (Signed)
Please advise 

## 2020-02-17 ENCOUNTER — Other Ambulatory Visit: Payer: Self-pay

## 2020-02-17 ENCOUNTER — Ambulatory Visit (INDEPENDENT_AMBULATORY_CARE_PROVIDER_SITE_OTHER): Payer: Medicare HMO

## 2020-02-17 DIAGNOSIS — E538 Deficiency of other specified B group vitamins: Secondary | ICD-10-CM | POA: Diagnosis not present

## 2020-02-17 MED ORDER — CYANOCOBALAMIN 1000 MCG/ML IJ SOLN
1000.0000 ug | Freq: Once | INTRAMUSCULAR | Status: AC
  Administered 2020-02-17: 1000 ug via INTRAMUSCULAR

## 2020-02-17 NOTE — Telephone Encounter (Signed)
Called patient reviewed all information. Made B-12 for this month and labs for next month. No other questions.

## 2020-02-21 NOTE — Progress Notes (Addendum)
Approved B12 injection.

## 2020-03-12 ENCOUNTER — Other Ambulatory Visit: Payer: Self-pay | Admitting: Primary Care

## 2020-03-15 ENCOUNTER — Other Ambulatory Visit: Payer: Self-pay | Admitting: Primary Care

## 2020-03-15 DIAGNOSIS — E538 Deficiency of other specified B group vitamins: Secondary | ICD-10-CM

## 2020-03-20 ENCOUNTER — Other Ambulatory Visit: Payer: Medicare HMO

## 2020-04-07 ENCOUNTER — Telehealth: Payer: Self-pay | Admitting: Primary Care

## 2020-04-07 NOTE — Telephone Encounter (Signed)
Called patient spoke to wife ( on [place]) app made for b-12 next week will call if any issues or questions.

## 2020-04-07 NOTE — Telephone Encounter (Signed)
Pt called in wanted to know about getting the another b-12 injection before coming in

## 2020-04-11 ENCOUNTER — Ambulatory Visit: Payer: Medicare Other | Attending: Internal Medicine

## 2020-04-11 ENCOUNTER — Ambulatory Visit: Payer: Medicare HMO

## 2020-04-11 DIAGNOSIS — Z23 Encounter for immunization: Secondary | ICD-10-CM

## 2020-04-11 NOTE — Progress Notes (Signed)
   Covid-19 Vaccination Clinic  Name:  KYRON SCHLITT    MRN: [id number] DOB: 06-06-1955  04/11/2020  Mr. Reczek was observed post Covid-19 immunization for 15 minutes without incident. He was provided with Vaccine Information Sheet and instruction to access the V-Safe system.   Mr. Yeley was instructed to call 911 with any severe reactions post vaccine: Marland Kitchen Difficulty breathing  . Swelling of face and throat  . A fast heartbeat  . A bad rash all over body  . Dizziness and weakness   Immunizations Administered    Name Date Dose VIS Date Route   Moderna Covid-19 Booster Vaccine 04/11/2020  3:21 PM 0.25 mL 01/19/2020 Intramuscular   Manufacturer: Levan Hurst   Lot: [id number]   [place]: [id number]

## 2020-04-12 ENCOUNTER — Ambulatory Visit: Payer: Medicare Other

## 2020-04-12 ENCOUNTER — Other Ambulatory Visit: Payer: Self-pay

## 2020-04-18 DIAGNOSIS — Z1152 Encounter for screening for COVID-19: Secondary | ICD-10-CM | POA: Diagnosis not present

## 2020-04-25 ENCOUNTER — Telehealth: Payer: Self-pay | Admitting: *Deleted

## 2020-04-25 NOTE — Telephone Encounter (Signed)
Pt called to let PCP know he tested positive for covid. He received his covid booster on 04/11/20 and a few days later he felt "like he had the flu" he thought it was due to the vaccine but then the next day he lost his taste and smell. Pt decided to go to an UC to get tested only but cone referred him to a testing site since he was stable. Pt do go and get tested and received a positive results from his test he had done on 04/13/20. Pt could never get in touch anyone from the testing site so he went back up there today and found out his test was positive. Pt did admit that while covid test was pending he went to the dentist and the store a few times. Pt said as of today his sxs have all resolved he feels 100% better. Pt was asking the testing site today if he could be re-tested to see if he is negative now. Testing site told him to contact his PCP to discuss f/u covid testing.   Pt had 2 questions.   1: if he should get a f/u covid test to make sure he is negative, pt wants to but testing site said it's Kate's call. 2: he has a b12 nurse visit scheduled here on 05/03/20 and a CPE on 05/25/20 pt wanted to make sure that it's been enough time and that he is okay coming in for his two upcoming appt.

## 2020-04-26 ENCOUNTER — Other Ambulatory Visit: Payer: Self-pay | Admitting: Primary Care

## 2020-04-26 DIAGNOSIS — M542 Cervicalgia: Secondary | ICD-10-CM

## 2020-04-26 DIAGNOSIS — G8929 Other chronic pain: Secondary | ICD-10-CM

## 2020-04-26 NOTE — Telephone Encounter (Signed)
Pharmacy requests refill on: Diclofenac 75 mg   LAST REFILL: 12/24/2019 (Q-90, R-0)  LAST OV: 11/17/2019 NEXT OV: 05/25/2020 PHARMACY: [id number] [place], [place]

## 2020-04-26 NOTE — Telephone Encounter (Signed)
Called patient states that he was tested on 04/13/2020 and did not get results until yesterday and they were positive. He has got his taste back and smell around 04/21/2020.  States he is till having "cold like symptoms" with cough and congestion. So he has not been symptoms free yet. Please advise

## 2020-04-26 NOTE — Telephone Encounter (Signed)
I do not recommend he be tested for Covid for at least 2 months as he could test positive up until then.  I am not sure what "a few days later" means for symptom onset date, but check with him and make sure he will be 10 days from symptom onset.  If so and his symptoms have resolved, I do not see why he cannot come into the office.

## 2020-04-27 ENCOUNTER — Ambulatory Visit: Payer: Medicare Other

## 2020-04-27 NOTE — Telephone Encounter (Signed)
Refills sent to pharmacy. 

## 2020-04-27 NOTE — Telephone Encounter (Signed)
If he is having any Covid symptoms then needs to reschedule his B12 injection on 05/03/20. As long as he is symptom-free then he may come in for his physical as scheduled in late February.

## 2020-04-28 NOTE — Telephone Encounter (Signed)
lm

## 2020-04-28 NOTE — Telephone Encounter (Signed)
Patient returned the call. Please call him back. EM

## 2020-04-28 NOTE — Telephone Encounter (Signed)
Called patient states that he has been having some sinus issues. He will call before b12 if still having any symptoms.

## 2020-05-03 ENCOUNTER — Ambulatory Visit (INDEPENDENT_AMBULATORY_CARE_PROVIDER_SITE_OTHER): Payer: Medicare Other | Admitting: *Deleted

## 2020-05-03 ENCOUNTER — Other Ambulatory Visit: Payer: Self-pay

## 2020-05-03 DIAGNOSIS — E538 Deficiency of other specified B group vitamins: Secondary | ICD-10-CM | POA: Diagnosis not present

## 2020-05-03 MED ORDER — CYANOCOBALAMIN 1000 MCG/ML IJ SOLN
1000.0000 ug | Freq: Once | INTRAMUSCULAR | Status: AC
  Administered 2020-05-03: 1000 ug via INTRAMUSCULAR

## 2020-05-03 NOTE — Progress Notes (Signed)
Per orders of Kate Clark, NP, injection of B12 given by [place] Mccutchen M. Patient tolerated injection well. 

## 2020-05-09 ENCOUNTER — Ambulatory Visit: Payer: Self-pay | Admitting: Podiatry

## 2020-05-16 ENCOUNTER — Other Ambulatory Visit: Payer: Medicare HMO

## 2020-05-18 ENCOUNTER — Ambulatory Visit: Payer: Self-pay | Admitting: Podiatry

## 2020-05-20 ENCOUNTER — Other Ambulatory Visit: Payer: Self-pay | Admitting: Primary Care

## 2020-05-20 DIAGNOSIS — M542 Cervicalgia: Secondary | ICD-10-CM

## 2020-05-20 DIAGNOSIS — G8929 Other chronic pain: Secondary | ICD-10-CM

## 2020-05-22 ENCOUNTER — Encounter: Payer: Medicare HMO | Admitting: Primary Care

## 2020-05-22 NOTE — Telephone Encounter (Signed)
L/m to call office has app this week to see if needs refill before then or not.

## 2020-05-22 NOTE — Telephone Encounter (Signed)
Patient has supply to last to app this week.

## 2020-05-23 ENCOUNTER — Ambulatory Visit: Payer: Self-pay | Admitting: Podiatry

## 2020-05-25 ENCOUNTER — Encounter: Payer: Self-pay | Admitting: Primary Care

## 2020-05-25 ENCOUNTER — Other Ambulatory Visit: Payer: Self-pay

## 2020-05-25 ENCOUNTER — Ambulatory Visit (INDEPENDENT_AMBULATORY_CARE_PROVIDER_SITE_OTHER): Payer: Medicare Other | Admitting: Primary Care

## 2020-05-25 VITALS — BP 118/70 | HR 74 | Temp 98.4°F | Ht 67.5 in | Wt 231.5 lb

## 2020-05-25 DIAGNOSIS — K219 Gastro-esophageal reflux disease without esophagitis: Secondary | ICD-10-CM | POA: Diagnosis not present

## 2020-05-25 DIAGNOSIS — F419 Anxiety disorder, unspecified: Secondary | ICD-10-CM

## 2020-05-25 DIAGNOSIS — G8929 Other chronic pain: Secondary | ICD-10-CM

## 2020-05-25 DIAGNOSIS — E119 Type 2 diabetes mellitus without complications: Secondary | ICD-10-CM | POA: Diagnosis not present

## 2020-05-25 DIAGNOSIS — E538 Deficiency of other specified B group vitamins: Secondary | ICD-10-CM

## 2020-05-25 DIAGNOSIS — G4733 Obstructive sleep apnea (adult) (pediatric): Secondary | ICD-10-CM

## 2020-05-25 DIAGNOSIS — E785 Hyperlipidemia, unspecified: Secondary | ICD-10-CM | POA: Diagnosis not present

## 2020-05-25 DIAGNOSIS — M542 Cervicalgia: Secondary | ICD-10-CM

## 2020-05-25 DIAGNOSIS — Z0000 Encounter for general adult medical examination without abnormal findings: Secondary | ICD-10-CM

## 2020-05-25 DIAGNOSIS — I10 Essential (primary) hypertension: Secondary | ICD-10-CM

## 2020-05-25 DIAGNOSIS — Z125 Encounter for screening for malignant neoplasm of prostate: Secondary | ICD-10-CM | POA: Diagnosis not present

## 2020-05-25 DIAGNOSIS — F32A Depression, unspecified: Secondary | ICD-10-CM

## 2020-05-25 LAB — POCT GLYCOSYLATED HEMOGLOBIN (HGB A1C): Hemoglobin A1C: 6.5 % — AB (ref 4.0–5.6)

## 2020-05-25 NOTE — Assessment & Plan Note (Signed)
Compliant to Metformin 500 mg, but has been taking once daily. Discussed to take BID.   Discussed to schedule eye exam. Managed on statin and ACE-I.  Repeat A1C pending. Follow up in 3-6 months depending on A1C result.

## 2020-05-25 NOTE — Assessment & Plan Note (Signed)
Compliant to atorvastatin 20 mg, repeat lipid panel pending.   Discussed the importance of a healthy diet and regular exercise in order for weight loss, and to reduce the risk of any potential medical problems.

## 2020-05-25 NOTE — Progress Notes (Signed)
Subjective:    Patient ID: Kenneth [name], male    DOB: 07-Jul-1955, 65 y.o.   MRN: [id number]  HPI  This visit occurred during the SARS-CoV-2 public health emergency.  Safety protocols were in place, including screening questions prior to the visit, additional usage of staff PPE, and extensive cleaning of exam room while observing appropriate contact time as indicated for disinfecting solutions.   Kenneth [name] is a 65 year old male who presents today for complete physical.  Immunizations: -Tetanus: 2015 -Influenza: Completed this season  -Shingles: Completed one dose, he will return for second dose. -Pneumonia: 2020 -Covid-19: Completed J&J and also Moderna Booster  Diet:  He endorses a fair diet.  Exercise: He is active, working in the yard, walking.  Eye exam: Due, he will schedule Dental exam: Completes regularly  Colonoscopy: 2019, due in 2029 PSA: Due Hep C Screen: Due  BP Readings from Last 3 Encounters:  05/25/20 118/70  11/17/19 128/86  05/26/19 (!) 155/96     Review of Systems  Constitutional: Negative for unexpected weight change.  HENT: Negative for rhinorrhea.   Eyes: Negative for visual disturbance.  Respiratory: Negative for cough and shortness of breath.   Cardiovascular: Negative for chest pain.  Gastrointestinal: Negative for constipation and diarrhea.  Genitourinary: Negative for difficulty urinating.  Musculoskeletal: Positive for arthralgias and neck pain.  Skin: Negative for rash.  Allergic/Immunologic: Negative for environmental allergies.  Neurological: Negative for dizziness and headaches.  Psychiatric/Behavioral:       Recently resumed sertraline, encouraged regular use.       Past Medical History:  Diagnosis Date  . Anxiety and depression   . Bipolar 1 disorder ([place])   . Blood transfusion without reported diagnosis   . Chronic kidney disease   . Depression   . GERD (gastroesophageal reflux disease)   . Hypercholesterolemia   .  Hypertension   . Sleep apnea   . Type 2 diabetes mellitus ([place])      Social History   Socioeconomic History  . Marital status: Divorced    Spouse name: Not on file  . Number of children: Not on file  . Years of education: Not on file  . Highest education level: Not on file  Occupational History  . Not on file  Tobacco Use  . Smoking status: Current Every Day Smoker    Packs/day: 1.00    Years: 41.00    Pack years: 41.00    Types: Cigarettes  . Smokeless tobacco: Never Used  Vaping Use  . Vaping Use: Never used  Substance and Sexual Activity  . Alcohol use: No  . Drug use: No  . Sexual activity: Not on file  Other Topics Concern  . Not on file  Social History Narrative   Single.   1 daughter, 1 grandchild.   Retired. Once worked in [employer].   Enjoys sports, racing, traveling.    Social Determinants of Health   Financial Resource Strain: Not on file  Food Insecurity: Not on file  Transportation Needs: Not on file  Physical Activity: Not on file  Stress: Not on file  Social Connections: Not on file  Intimate Partner Violence: Not on file    Past Surgical History:  Procedure Laterality Date  . L hand surgery    . R knee arthoroscopy    . SPLENECTOMY      Family History  Problem Relation Age of Onset  . Hypertension Mother   . Mental illness Mother   .  Diabetes Mother   . Hypertension Father   . Lung cancer Father   . Diabetes Paternal Grandmother   . Colon cancer Maternal Uncle   . Esophageal cancer Neg Hx   . Liver cancer Neg Hx   . Pancreatic cancer Neg Hx   . Rectal cancer Neg Hx   . Stomach cancer Neg Hx     No Known Allergies  Current Outpatient Medications on File Prior to Visit  Medication Sig Dispense Refill  . Accu-Chek FastClix Lancets MISC USE TO CHECK BLOOD SUGAR UP TO 4 TIMES A DAY 100 each 3  . ACCU-CHEK GUIDE test strip USE TO CHECK UP TO 4 TIMES A DAY 100 strip 3  . Ascorbic Acid (VITAMIN C) 1000 MG tablet Take 1,000 mg by mouth  daily.    Marland Kitchen atenolol (TENORMIN) 50 MG tablet TAKE 1 TABLET BY MOUTH EVERY DAY 90 tablet 1  . atorvastatin (LIPITOR) 20 MG tablet TAKE 1 TABLET BY MOUTH EVERY DAY IN THE EVENING 90 tablet 1  . Cyanocobalamin (VITAMIN B-12 PO) Take 1 tablet by mouth daily.    . diclofenac (VOLTAREN) 75 MG EC tablet TAKE 1 TABLET (75 MG TOTAL) BY MOUTH DAILY AS NEEDED FOR MODERATE PAIN. 90 tablet 0  . fluticasone (FLONASE) 50 MCG/ACT nasal spray SPRAY 2 SPRAYS INTO EACH NOSTRIL EVERY DAY 16 mL 3  . GARLIC PO Take 1 tablet by mouth daily.     . hydrochlorothiazide (HYDRODIURIL) 25 MG tablet TAKE 1 TABLET BY MOUTH EVERY DAY 90 tablet 1  . lisinopril (ZESTRIL) 40 MG tablet TAKE 1 TABLET BY MOUTH EVERY DAY 90 tablet 1  . metFORMIN (GLUCOPHAGE) 500 MG tablet Take 1 tablet (500 mg total) by mouth 2 (two) times daily with a meal. For diabetes. 180 tablet 3  . methocarbamol (ROBAXIN) 500 MG tablet TAKE 1 TABLET (500 MG TOTAL) BY MOUTH DAILY AS NEEDED FOR MUSCLE SPASMS. 30 tablet 0  . Multiple Vitamins-Minerals (MULTIVITAMIN ADULT) TABS Take 1 tablet by mouth daily.    . Omega-3 Fatty Acids (OMEGA 3 PO) Take 1 capsule by mouth daily.    . sertraline (ZOLOFT) 50 MG tablet TAKE 1 TABLET BY MOUTH EVERY DAY 90 tablet 1  . blood glucose meter kit and supplies KIT Dispense based on patient and insurance preference. Use up to four times daily as directed. (FOR ICD-9 250.00, 250.01). (Patient not taking: Reported on 05/25/2020) 1 each 0  . Ginseng (GIN-ZING PO) Take 1 capsule by mouth daily.  (Patient not taking: Reported on 05/25/2020)    . SHINGRIX injection  (Patient not taking: Reported on 05/25/2020)     Current Facility-Administered Medications on File Prior to Visit  Medication Dose Route Frequency Provider Last Rate Last Admin  . 0.9 %  sodium chloride infusion  500 mL Intravenous Once Ladene Artist, MD        BP 118/70   Pulse 74   Temp 98.4 F (36.9 C) (Temporal)   Ht 5' 7.5" (1.715 m)   Wt 231 lb 8 oz (105 kg)    SpO2 93%   BMI 35.72 kg/m    Objective:   Physical Exam Constitutional:      Appearance: He is well-nourished.  HENT:     Right Ear: Tympanic membrane and ear canal normal.     Left Ear: Tympanic membrane and ear canal normal.     Mouth/Throat:     Mouth: Oropharynx is clear and moist.  Eyes:     Extraocular Movements:  EOM normal.     Pupils: Pupils are equal, round, and reactive to light.  Cardiovascular:     Rate and Rhythm: Normal rate and regular rhythm.  Pulmonary:     Effort: Pulmonary effort is normal.     Breath sounds: Normal breath sounds.  Abdominal:     General: Bowel sounds are normal.     Palpations: Abdomen is soft.     Tenderness: There is no abdominal tenderness.  Musculoskeletal:        General: Normal range of motion.     Cervical back: Neck supple.  Skin:    General: Skin is warm and dry.  Neurological:     Mental Status: He is alert and oriented to person, place, and time.     Cranial Nerves: No cranial nerve deficit.     Deep Tendon Reflexes:     Reflex Scores:      Patellar reflexes are 2+ on the right side and 2+ on the left side. Psychiatric:        Mood and Affect: Mood and affect and mood normal.            Assessment & Plan:

## 2020-05-25 NOTE — Patient Instructions (Signed)
Stop by the lab prior to leaving today. I will notify you of your results once received.   Start exercising. You should be getting 150 minutes of moderate intensity exercise weekly.  It's important to improve your diet by reducing consumption of fast food, fried food, processed snack foods, sugary drinks. Increase consumption of fresh vegetables and fruits, whole grains, water.  Ensure you are drinking 64 ounces of water daily.  Go to the pharmacy for your second Shingrix vaccine.  Please schedule a follow up appointment in 6 months for diabetes check.   It was a pleasure to see you today!   Preventive Care 40-2 Years Old, Male Preventive care refers to lifestyle choices and visits with your health care provider that can promote health and wellness. This includes:  A yearly physical exam. This is also called an annual wellness visit.  Regular dental and eye exams.  Immunizations.  Screening for certain conditions.  Healthy lifestyle choices, such as: ? Eating a healthy diet. ? Getting regular exercise. ? Not using drugs or products that contain nicotine and tobacco. ? Limiting alcohol use. What can I expect for my preventive care visit? Physical exam Your health care provider will check your:  Height and weight. These may be used to calculate your BMI (body mass index). BMI is a measurement that tells if you are at a healthy weight.  Heart rate and blood pressure.  Body temperature.  Skin for abnormal spots. Counseling Your health care provider may ask you questions about your:  Past medical problems.  Family's medical history.  Alcohol, tobacco, and drug use.  Emotional well-being.  Home life and relationship well-being.  Sexual activity.  Diet, exercise, and sleep habits.  Work and work Statistician.  Access to firearms. What immunizations do I need? Vaccines are usually given at various ages, according to a schedule. Your health care provider will  recommend vaccines for you based on your age, medical history, and lifestyle or other factors, such as travel or where you work.   What tests do I need? Blood tests  Lipid and cholesterol levels. These may be checked every 5 years, or more often if you are over 75 years old.  Hepatitis C test.  Hepatitis B test. Screening  Lung cancer screening. You may have this screening every year starting at age 98 if you have a 30-pack-year history of smoking and currently smoke or have quit within the past 15 years.  Prostate cancer screening. Recommendations will vary depending on your family history and other risks.  Genital exam to check for testicular cancer or hernias.  Colorectal cancer screening. ? All adults should have this screening starting at age 51 and continuing until age 23. ? Your health care provider may recommend screening at age 39 if you are at increased risk. ? You will have tests every 1-10 years, depending on your results and the type of screening test.  Diabetes screening. ? This is done by checking your blood sugar (glucose) after you have not eaten for a while (fasting). ? You may have this done every 1-3 years.  STD (sexually transmitted disease) testing, if you are at risk. Follow these instructions at home: Eating and drinking  Eat a diet that includes fresh fruits and vegetables, whole grains, lean protein, and low-fat dairy products.  Take vitamin and mineral supplements as recommended by your health care provider.  Do not drink alcohol if your health care provider tells you not to drink.  If you drink  alcohol: ? Limit how much you have to 0-2 drinks a day. ? Be aware of how much alcohol is in your drink. In the [place] one drink equals one 12 oz bottle of beer (355 mL), one 5 oz glass of wine (148 mL), or one 1 oz glass of hard liquor (44 mL).   Lifestyle  Take daily care of your teeth and gums. Brush your teeth every morning and night with fluoride  toothpaste. Floss one time each day.  Stay active. Exercise for at least 30 minutes 5 or more days each week.  Do not use any products that contain nicotine or tobacco, such as cigarettes, e-cigarettes, and chewing tobacco. If you need help quitting, ask your health care provider.  Do not use drugs.  If you are sexually active, practice safe sex. Use a condom or other form of protection to prevent STIs (sexually transmitted infections).  If told by your health care provider, take low-dose aspirin daily starting at age 2.  Find healthy ways to cope with stress, such as: ? Meditation, yoga, or listening to music. ? Journaling. ? Talking to a trusted person. ? Spending time with friends and family. Safety  Always wear your seat belt while driving or riding in a vehicle.  Do not drive: ? If you have been drinking alcohol. Do not ride with someone who has been drinking. ? When you are tired or distracted. ? While texting.  Wear a helmet and other protective equipment during sports activities.  If you have firearms in your house, make sure you follow all gun safety procedures. What's next?  Go to your health care provider once a year for an annual wellness visit.  Ask your health care provider how often you should have your eyes and teeth checked.  Stay up to date on all vaccines. This information is not intended to replace advice given to you by your health care provider. Make sure you discuss any questions you have with your health care provider. Document Revised: 12/15/2018 Document Reviewed: 03/12/2018 Elsevier Patient Education  2021 [employer].

## 2020-05-25 NOTE — Assessment & Plan Note (Signed)
Discussed to obtain second Shingrix vaccine. PSA due and pending. Colonoscopy UTD, due in 2019.  Discussed the importance of a healthy diet and regular exercise in order for weight loss, and to reduce the risk of any potential medical problems.  Exam today stable. Labs pending.

## 2020-05-25 NOTE — Assessment & Plan Note (Addendum)
Compliant to monthly B12 injections for the most part.  He did miss his injection in January due to COVID-19 infection.  Repeat B12 level pending today. We will likely continue monthly B12 injections.  Await results.

## 2020-05-25 NOTE — Assessment & Plan Note (Signed)
Has not been taking sertraline regularly, he has resumed as last week.   Discussed to continue sertraline 50 mg daily.

## 2020-05-25 NOTE — Assessment & Plan Note (Signed)
Infrequent, trying to avoid triggers. Continue to monitor.

## 2020-05-25 NOTE — Assessment & Plan Note (Signed)
Could not tolerate CPAP machine, denies sleeping concerns.

## 2020-05-25 NOTE — Assessment & Plan Note (Signed)
Compliant to atenolol 50 mg, HCTZ 25 mg, lisinopril 40 mg daily, continue same.   CMP pending.

## 2020-05-25 NOTE — Assessment & Plan Note (Signed)
Over stable, taking diclofenac 75 mg BID and methocarbamol 500 mg PRN. Continue infrequent use.

## 2020-05-26 LAB — COMPREHENSIVE METABOLIC PANEL
ALT: 22 U/L (ref 0–53)
AST: 16 U/L (ref 0–37)
Albumin: 4.3 g/dL (ref 3.5–5.2)
Alkaline Phosphatase: 82 U/L (ref 39–117)
BUN: 17 mg/dL (ref 6–23)
CO2: 29 mEq/L (ref 19–32)
Calcium: 9.7 mg/dL (ref 8.4–10.5)
Chloride: 101 mEq/L (ref 96–112)
Creatinine, Ser: 0.88 mg/dL (ref 0.40–1.50)
GFR: 90.92 mL/min (ref >60.00)
Glucose, Bld: 79 mg/dL (ref 70–99)
Potassium: 4.4 mEq/L (ref 3.5–5.1)
Sodium: 140 mEq/L (ref 135–145)
Total Bilirubin: 0.4 mg/dL (ref 0.2–1.2)
Total Protein: 7.1 g/dL (ref 6.0–8.3)

## 2020-05-26 LAB — LIPID PANEL
Cholesterol: 136 mg/dL (ref 0–200)
HDL: 36 mg/dL — ABNORMAL LOW (ref >39.00)
LDL Cholesterol: 71 mg/dL (ref 0–99)
NonHDL: 100.37
Total CHOL/HDL Ratio: 4
Triglycerides: 148 mg/dL (ref 0.0–149.0)
VLDL: 29.6 mg/dL (ref 0.0–40.0)

## 2020-05-26 LAB — PSA, MEDICARE: PSA: 1.8 ng/ml (ref 0.10–4.00)

## 2020-05-26 LAB — HEMOGLOBIN A1C: Hgb A1c MFr Bld: 6.6 % — ABNORMAL HIGH (ref 4.6–6.5)

## 2020-05-26 LAB — CBC
HCT: 45.6 % (ref 39.0–52.0)
Hemoglobin: 15.4 g/dL (ref 13.0–17.0)
MCHC: 33.8 g/dL (ref 30.0–36.0)
MCV: 99.2 fl (ref 78.0–100.0)
Platelets: 395 10*3/uL (ref 150.0–400.0)
RBC: 4.6 Mil/uL (ref 4.22–5.81)
RDW: 13 % (ref 11.5–15.5)
WBC: 11.4 10*3/uL — ABNORMAL HIGH (ref 4.0–10.5)

## 2020-05-26 LAB — VITAMIN B12: Vitamin B-12: 214 pg/mL (ref 211–911)

## 2020-06-07 ENCOUNTER — Telehealth: Payer: Self-pay | Admitting: Primary Care

## 2020-06-07 NOTE — Telephone Encounter (Signed)
LVM for pt to rtn my call to schedule AWV with [facility].  

## 2020-06-16 ENCOUNTER — Telehealth: Payer: Self-pay | Admitting: Primary Care

## 2020-06-16 NOTE — Telephone Encounter (Signed)
LVM for pt too rtn my call to schedule awv with [facility]. Please schedule if pt calls the office.

## 2020-06-29 DIAGNOSIS — E1169 Type 2 diabetes mellitus with other specified complication: Secondary | ICD-10-CM | POA: Diagnosis not present

## 2020-06-29 DIAGNOSIS — Z7984 Long term (current) use of oral hypoglycemic drugs: Secondary | ICD-10-CM | POA: Diagnosis not present

## 2020-06-29 DIAGNOSIS — I110 Hypertensive heart disease with heart failure: Secondary | ICD-10-CM | POA: Diagnosis not present

## 2020-06-29 DIAGNOSIS — Z008 Encounter for other general examination: Secondary | ICD-10-CM | POA: Diagnosis not present

## 2020-06-29 DIAGNOSIS — I509 Heart failure, unspecified: Secondary | ICD-10-CM | POA: Diagnosis not present

## 2020-06-29 DIAGNOSIS — E785 Hyperlipidemia, unspecified: Secondary | ICD-10-CM | POA: Diagnosis not present

## 2020-06-29 DIAGNOSIS — F17210 Nicotine dependence, cigarettes, uncomplicated: Secondary | ICD-10-CM | POA: Diagnosis not present

## 2020-06-29 DIAGNOSIS — Z0000 Encounter for general adult medical examination without abnormal findings: Secondary | ICD-10-CM | POA: Diagnosis not present

## 2020-06-29 DIAGNOSIS — G4733 Obstructive sleep apnea (adult) (pediatric): Secondary | ICD-10-CM | POA: Diagnosis not present

## 2020-07-11 ENCOUNTER — Encounter (HOSPITAL_COMMUNITY): Payer: Self-pay

## 2020-07-11 ENCOUNTER — Emergency Department (HOSPITAL_COMMUNITY)
Admission: EM | Admit: 2020-07-11 | Discharge: 2020-07-12 | Disposition: A | Discharge status: Home / Self Care | Payer: Medicare Other | Attending: Emergency Medicine | Admitting: Emergency Medicine

## 2020-07-11 ENCOUNTER — Other Ambulatory Visit: Payer: Self-pay

## 2020-07-11 ENCOUNTER — Ambulatory Visit (HOSPITAL_COMMUNITY)
Admission: EM | Admit: 2020-07-11 | Discharge: 2020-07-11 | Disposition: A | Discharge status: Home / Self Care | Payer: Medicare Other | Attending: Emergency Medicine | Admitting: Emergency Medicine

## 2020-07-11 DIAGNOSIS — I129 Hypertensive chronic kidney disease with stage 1 through stage 4 chronic kidney disease, or unspecified chronic kidney disease: Secondary | ICD-10-CM | POA: Diagnosis not present

## 2020-07-11 DIAGNOSIS — Z7984 Long term (current) use of oral hypoglycemic drugs: Secondary | ICD-10-CM | POA: Diagnosis not present

## 2020-07-11 DIAGNOSIS — L0390 Cellulitis, unspecified: Secondary | ICD-10-CM

## 2020-07-11 DIAGNOSIS — S50862A Insect bite (nonvenomous) of left forearm, initial encounter: Secondary | ICD-10-CM | POA: Insufficient documentation

## 2020-07-11 DIAGNOSIS — N189 Chronic kidney disease, unspecified: Secondary | ICD-10-CM | POA: Diagnosis not present

## 2020-07-11 DIAGNOSIS — W57XXXA Bitten or stung by nonvenomous insect and other nonvenomous arthropods, initial encounter: Secondary | ICD-10-CM | POA: Diagnosis not present

## 2020-07-11 DIAGNOSIS — T7840XA Allergy, unspecified, initial encounter: Secondary | ICD-10-CM | POA: Diagnosis not present

## 2020-07-11 DIAGNOSIS — F17210 Nicotine dependence, cigarettes, uncomplicated: Secondary | ICD-10-CM | POA: Diagnosis not present

## 2020-07-11 DIAGNOSIS — E1122 Type 2 diabetes mellitus with diabetic chronic kidney disease: Secondary | ICD-10-CM | POA: Diagnosis not present

## 2020-07-11 DIAGNOSIS — Z79899 Other long term (current) drug therapy: Secondary | ICD-10-CM | POA: Diagnosis not present

## 2020-07-11 MED ORDER — CEPHALEXIN 500 MG PO CAPS: 500.0000 mg | ORAL_CAPSULE | Freq: Three times a day (TID) | ORAL | 0 refills | Status: AC

## 2020-07-11 MED ORDER — HYDROXYZINE HCL 25 MG PO TABS: 25.0000 mg | ORAL_TABLET | Freq: Four times a day (QID) | ORAL | 0 refills | Status: DC

## 2020-07-11 NOTE — Discharge Instructions (Signed)
Take the Keflex 3 times a day for the next 7 days.  Make sure you take all of the pills.  You can use the hydroxyzine as needed for itching and swelling.   Go to the emergency room for any increased swelling, redness, worsening pain, or red streaking.  Follow-up with your primary care as needed.

## 2020-07-11 NOTE — ED Triage Notes (Signed)
Pt states he was bit by something this morning around 6am on L forearm, reports redness and swelling to area since. Seen at UC earlier but states he was not able to get the abx filled until tomorrow

## 2020-07-11 NOTE — ED Triage Notes (Signed)
Pt states he was stung by a bee this morning. He states his arm was swollen this morning.

## 2020-07-11 NOTE — ED Provider Notes (Signed)
[facility]    CSN: [id number] Arrival date & time: 07/11/20  1946      History   Chief Complaint Chief Complaint  Patient presents with  . Insect Bite    HPI Kenneth [name] is a 65 y.o. male.   Patient here for evaluation of left arm redness, pain, and swelling after possible insect bite.  Reports possible bug bite earlier today and then developed this redness and swelling.  Reports taking Benadryl with minimal relief.  Reports similar reaction to bee stings in the past but they have all been responsive to Benadryl.  Patient able to move all extremities and wiggle fingers.  Left arm with redness, swelling, and warmth.  Denies any specific alleviating or aggravating factors.  Denies any fevers, chest pain, shortness of breath, N/V/D, numbness, tingling, weakness, abdominal pain, or headaches.   ROS: As per HPI, all other pertinent ROS negative   The history is provided by the patient.    Past Medical History:  Diagnosis Date  . Anxiety and depression   . Bipolar 1 disorder ([place])   . Blood transfusion without reported diagnosis   . Chronic kidney disease   . Depression   . GERD (gastroesophageal reflux disease)   . Hypercholesterolemia   . Hypertension   . Sleep apnea   . Type 2 diabetes mellitus [facility])     Patient Active Problem List   Diagnosis Date Noted  . Acute thoracic back pain 05/14/2019  . Lower extremity numbness 04/17/2018  . Rhinorrhea 04/08/2018  . Vitamin B 12 deficiency 07/28/2017  . Tobacco abuse 05/05/2017  . Preventative health care 04/24/2017  . Numbness and tingling 04/15/2017  . OSA (obstructive sleep apnea) 02/07/2017  . Epigastric abdominal tenderness without rebound tenderness 09/10/2016  . GERD without esophagitis 09/10/2016  . Nonspecific abnormal electrocardiogram (ECG) (EKG) 09/10/2016  . Lower extremity edema 08/12/2016  . Type 2 diabetes mellitus ([place]) 06/26/2016  . Anxiety and depression 06/26/2016  . Hypertension  06/26/2016  . Hyperlipidemia 06/26/2016  . Chronic neck pain 06/26/2016    Past Surgical History:  Procedure Laterality Date  . L hand surgery    . R knee arthoroscopy    . SPLENECTOMY         Home Medications    Prior to Admission medications   Medication Sig Start Date End Date Taking? Authorizing Provider  cephALEXin (KEFLEX) 500 MG capsule Take 1 capsule (500 mg total) by mouth 3 (three) times daily for 7 days. 07/11/20 07/18/20 Yes Pearson Forster, NP  hydrOXYzine (ATARAX/VISTARIL) 25 MG tablet Take 1 tablet (25 mg total) by mouth every 6 (six) hours. 07/11/20  Yes Pearson Forster, NP  Accu-Chek FastClix Lancets MISC USE TO CHECK BLOOD SUGAR UP TO 4 TIMES A DAY 04/16/19   Pleas Koch, NP  ACCU-CHEK GUIDE test strip USE TO CHECK UP TO 4 TIMES A DAY 04/16/19   Pleas Koch, NP  Ascorbic Acid (VITAMIN C) 1000 MG tablet Take 1,000 mg by mouth daily.    [provider]  atenolol (TENORMIN) 50 MG tablet TAKE 1 TABLET BY MOUTH EVERY DAY 01/21/20   Pleas Koch, NP  atorvastatin (LIPITOR) 20 MG tablet TAKE 1 TABLET BY MOUTH EVERY DAY IN THE EVENING 02/11/20   Pleas Koch, NP  blood glucose meter kit and supplies KIT Dispense based on patient and insurance preference. Use up to four times daily as directed. (FOR ICD-9 250.00, 250.01). Patient not taking: Reported on 05/25/2020  11/18/18   Pleas Koch, NP  Cyanocobalamin (VITAMIN B-12 PO) Take 1 tablet by mouth daily.    [provider]  diclofenac (VOLTAREN) 75 MG EC tablet TAKE 1 TABLET (75 MG TOTAL) BY MOUTH DAILY AS NEEDED FOR MODERATE PAIN. 04/27/20   Pleas Koch, NP  fluticasone [facility]) 50 MCG/ACT nasal spray SPRAY 2 SPRAYS INTO EACH NOSTRIL EVERY DAY 03/13/20   Pleas Koch, NP  GARLIC PO Take 1 tablet by mouth daily.     [provider]  Ginseng (GIN-ZING PO) Take 1 capsule by mouth daily.  Patient not taking: Reported on 05/25/2020    [provider]   hydrochlorothiazide (HYDRODIURIL) 25 MG tablet TAKE 1 TABLET BY MOUTH EVERY DAY 01/25/20   Pleas Koch, NP  lisinopril (ZESTRIL) 40 MG tablet TAKE 1 TABLET BY MOUTH EVERY DAY 10/26/19   Pleas Koch, NP  metFORMIN (GLUCOPHAGE) 500 MG tablet Take 1 tablet (500 mg total) by mouth 2 (two) times daily with a meal. For diabetes. 11/17/19   Pleas Koch, NP  methocarbamol (ROBAXIN) 500 MG tablet TAKE 1 TABLET (500 MG TOTAL) BY MOUTH DAILY AS NEEDED FOR MUSCLE SPASMS. 02/01/20   Pleas Koch, NP  Multiple Vitamins-Minerals (MULTIVITAMIN ADULT) TABS Take 1 tablet by mouth daily.    [provider]  Omega-3 Fatty Acids (OMEGA 3 PO) Take 1 capsule by mouth daily.    [provider]  sertraline (ZOLOFT) 50 MG tablet TAKE 1 TABLET BY MOUTH EVERY DAY 01/25/20   Pleas Koch, NP  [facility] injection  05/15/19   [provider]    Family History Family History  Problem Relation Age of Onset  . Hypertension Mother   . Mental illness Mother   . Diabetes Mother   . Hypertension Father   . Lung cancer Father   . Diabetes Paternal Grandmother   . Colon cancer Maternal Uncle   . Esophageal cancer Neg Hx   . Liver cancer Neg Hx   . Pancreatic cancer Neg Hx   . Rectal cancer Neg Hx   . Stomach cancer Neg Hx     Social History Social History   Tobacco Use  . Smoking status: Current Every Day Smoker    Packs/day: 1.00    Years: 41.00    Pack years: 41.00    Types: Cigarettes  . Smokeless tobacco: Never Used  Vaping Use  . Vaping Use: Never used  Substance Use Topics  . Alcohol use: No  . Drug use: No     Allergies   Patient has no known allergies.   Review of Systems Review of Systems  Skin: Positive for rash.  All other systems reviewed and are negative.    Physical Exam Triage Vital Signs ED Triage Vitals  Enc Vitals Group     BP 07/11/20 1954 (!) 150/78     Pulse Rate 07/11/20 1954 70     Resp 07/11/20 1954 17     Temp  07/11/20 1954 98.3 F (36.8 C)     Temp Source 07/11/20 1954 Oral     SpO2 07/11/20 1954 96 %     Weight --      Height --      Head Circumference --      Peak Flow --      Pain Score 07/11/20 1952 0     Pain Loc --      Pain Edu? --      Excl. in  GC? --    No data found.  Updated Vital Signs BP (!) 150/78 (BP Location: Left Arm)   Pulse 70   Temp 98.3 F (36.8 C) (Oral)   Resp 17   SpO2 96%   Visual Acuity Right Eye Distance:   Left Eye Distance:   Bilateral Distance:    Right Eye Near:   Left Eye Near:    Bilateral Near:     Physical Exam Vitals and nursing note reviewed.  Constitutional:      General: He is not in acute distress.    Appearance: Normal appearance. He is not ill-appearing, toxic-appearing or diaphoretic.  HENT:     Head: Normocephalic and atraumatic.  Eyes:     Conjunctiva/sclera: Conjunctivae normal.  Cardiovascular:     Rate and Rhythm: Normal rate.     Pulses: Normal pulses.  Pulmonary:     Effort: Pulmonary effort is normal.  Abdominal:     General: Abdomen is flat.  Musculoskeletal:        General: Normal range of motion.     Cervical back: Normal range of motion.  Skin:    General: Skin is warm and dry.     Findings: Erythema (left forearm) present.  Neurological:     General: No focal deficit present.     Mental Status: He is alert and oriented to person, place, and time.  Psychiatric:        Mood and Affect: Mood normal.          UC Treatments / Results  Labs (all labs ordered are listed, but only abnormal results are displayed) Labs Reviewed - No data to display  EKG   Radiology No results found.  Procedures Procedures (including critical care time)  Medications Ordered in UC Medications - No data to display  Initial Impression / Assessment and Plan / UC Course  I have reviewed the triage vital signs and the nursing notes.  Pertinent labs & imaging results that were available during my care of the  patient were reviewed by me and considered in my medical decision making (see chart for details).     Left forearm redness and swelling.  Assessment more concerning for cellulitis versus actual allergic reaction.  Keflex 3 times daily for the next 7 days.   Hydroxyzine as needed also prescribed for potential allergic drug reaction. Strict ED follow-up for worsening signs of infection, including worsening edema, erythema, or red streaking. Follow-up with primary care as needed.   Final Clinical Impressions(s) / UC Diagnoses   Final diagnoses:  Cellulitis, unspecified cellulitis site  Allergic reaction, initial encounter     Discharge Instructions     Take the Keflex 3 times a day for the next 7 days.  Make sure you take all of the pills.  You can use the hydroxyzine as needed for itching and swelling.   Go to the emergency room for any increased swelling, redness, worsening pain, or red streaking.  Follow-up with your primary care as needed.     ED Prescriptions    Medication Sig Dispense Auth. Provider   cephALEXin (KEFLEX) 500 MG capsule Take 1 capsule (500 mg total) by mouth 3 (three) times daily for 7 days. 21 capsule Pearson Forster, NP   hydrOXYzine (ATARAX/VISTARIL) 25 MG tablet Take 1 tablet (25 mg total) by mouth every 6 (six) hours. 30 tablet Pearson Forster, NP     PDMP not reviewed this encounter.   Pearson Forster, NP 07/11/20 2021

## 2020-07-12 MED ORDER — DEXAMETHASONE SODIUM PHOSPHATE 10 MG/ML IJ SOLN
10.0000 mg | Freq: Once | INTRAMUSCULAR | Status: AC
  Administered 2020-07-12: 10 mg via INTRAMUSCULAR
  Filled 2020-07-12: qty 1

## 2020-07-12 NOTE — ED Provider Notes (Signed)
[facility] DEPT Provider Note   CSN: [id number] Arrival date & time: 07/11/20  2122     History Chief Complaint  Patient presents with  . Insect Bite    Kenneth [name] is a 65 y.o. male.  HPI    Pt is a 65 y/o male with a medical history as noted below. He presents today due to a possible insect bite. He states he felt a bite/sting to his left forearm this morning and swatted at the bug and "heard a crunch" but could not identify the bug after this occurred. He then began experiencing pain, redness, as well as swelling in the left forearm. No SOB, facial swelling, intraoral swelling, wheezing, n/v/d. He went to [facility] and was given prescriptions for keflex as well as hydroxyzine. He states he couldn't fill them until tomorrow and would like to be reevaluated.      Past Medical History:  Diagnosis Date  . Anxiety and depression   . Bipolar 1 disorder ([place])   . Blood transfusion without reported diagnosis   . Chronic kidney disease   . Depression   . GERD (gastroesophageal reflux disease)   . Hypercholesterolemia   . Hypertension   . Sleep apnea   . Type 2 diabetes mellitus [facility])     Patient Active Problem List   Diagnosis Date Noted  . Acute thoracic back pain 05/14/2019  . Lower extremity numbness 04/17/2018  . Rhinorrhea 04/08/2018  . Vitamin B 12 deficiency 07/28/2017  . Tobacco abuse 05/05/2017  . Preventative health care 04/24/2017  . Numbness and tingling 04/15/2017  . OSA (obstructive sleep apnea) 02/07/2017  . Epigastric abdominal tenderness without rebound tenderness 09/10/2016  . GERD without esophagitis 09/10/2016  . Nonspecific abnormal electrocardiogram (ECG) (EKG) 09/10/2016  . Lower extremity edema 08/12/2016  . Type 2 diabetes mellitus ([place]) 06/26/2016  . Anxiety and depression 06/26/2016  . Hypertension 06/26/2016  . Hyperlipidemia 06/26/2016  . Chronic neck pain 06/26/2016    Past Surgical History:  Procedure  Laterality Date  . L hand surgery    . R knee arthoroscopy    . SPLENECTOMY         Family History  Problem Relation Age of Onset  . Hypertension Mother   . Mental illness Mother   . Diabetes Mother   . Hypertension Father   . Lung cancer Father   . Diabetes Paternal Grandmother   . Colon cancer Maternal Uncle   . Esophageal cancer Neg Hx   . Liver cancer Neg Hx   . Pancreatic cancer Neg Hx   . Rectal cancer Neg Hx   . Stomach cancer Neg Hx     Social History   Tobacco Use  . Smoking status: Current Every Day Smoker    Packs/day: 1.00    Years: 41.00    Pack years: 41.00    Types: Cigarettes  . Smokeless tobacco: Never Used  Vaping Use  . Vaping Use: Never used  Substance Use Topics  . Alcohol use: No  . Drug use: No    Home Medications Prior to Admission medications   Medication Sig Start Date End Date Taking? Authorizing Provider  Accu-Chek FastClix Lancets MISC USE TO CHECK BLOOD SUGAR UP TO 4 TIMES A DAY 04/16/19   Pleas Koch, NP  ACCU-CHEK GUIDE test strip USE TO CHECK UP TO 4 TIMES A DAY 04/16/19   Pleas Koch, NP  Ascorbic Acid (VITAMIN C) 1000 MG tablet Take 1,000 mg by  mouth daily.    [provider]  atenolol (TENORMIN) 50 MG tablet TAKE 1 TABLET BY MOUTH EVERY DAY 01/21/20   Pleas Koch, NP  atorvastatin (LIPITOR) 20 MG tablet TAKE 1 TABLET BY MOUTH EVERY DAY IN THE EVENING 02/11/20   Pleas Koch, NP  blood glucose meter kit and supplies KIT Dispense based on patient and insurance preference. Use up to four times daily as directed. (FOR ICD-9 250.00, 250.01). Patient not taking: Reported on 05/25/2020 11/18/18   Pleas Koch, NP  cephALEXin (KEFLEX) 500 MG capsule Take 1 capsule (500 mg total) by mouth 3 (three) times daily for 7 days. 07/11/20 07/18/20  Pearson Forster, NP  Cyanocobalamin (VITAMIN B-12 PO) Take 1 tablet by mouth daily.    [provider]  diclofenac (VOLTAREN) 75 MG EC tablet TAKE 1 TABLET  (75 MG TOTAL) BY MOUTH DAILY AS NEEDED FOR MODERATE PAIN. 04/27/20   Pleas Koch, NP  fluticasone [facility]) 50 MCG/ACT nasal spray SPRAY 2 SPRAYS INTO EACH NOSTRIL EVERY DAY 03/13/20   Pleas Koch, NP  GARLIC PO Take 1 tablet by mouth daily.     [provider]  Ginseng (GIN-ZING PO) Take 1 capsule by mouth daily.  Patient not taking: Reported on 05/25/2020    [provider]  hydrochlorothiazide (HYDRODIURIL) 25 MG tablet TAKE 1 TABLET BY MOUTH EVERY DAY 01/25/20   Pleas Koch, NP  hydrOXYzine (ATARAX/VISTARIL) 25 MG tablet Take 1 tablet (25 mg total) by mouth every 6 (six) hours. 07/11/20   Pearson Forster, NP  lisinopril (ZESTRIL) 40 MG tablet TAKE 1 TABLET BY MOUTH EVERY DAY 10/26/19   Pleas Koch, NP  metFORMIN (GLUCOPHAGE) 500 MG tablet Take 1 tablet (500 mg total) by mouth 2 (two) times daily with a meal. For diabetes. 11/17/19   Pleas Koch, NP  methocarbamol (ROBAXIN) 500 MG tablet TAKE 1 TABLET (500 MG TOTAL) BY MOUTH DAILY AS NEEDED FOR MUSCLE SPASMS. 02/01/20   Pleas Koch, NP  Multiple Vitamins-Minerals (MULTIVITAMIN ADULT) TABS Take 1 tablet by mouth daily.    [provider]  Omega-3 Fatty Acids (OMEGA 3 PO) Take 1 capsule by mouth daily.    [provider]  sertraline (ZOLOFT) 50 MG tablet TAKE 1 TABLET BY MOUTH EVERY DAY 01/25/20   Pleas Koch, NP  [facility] injection  05/15/19   [provider]    Allergies    Patient has no known allergies.  Review of Systems   Review of Systems  All other systems reviewed and are negative. Ten systems reviewed and are negative for acute change, except as noted in the HPI.   Physical Exam Updated Vital Signs BP (!) 151/86   Pulse (!) 54   Temp 98.6 F (37 C)   Resp 16   Ht $R'5\' 8"'Je$  (1.727 m)   Wt 102.1 kg   SpO2 98%   BMI 34.21 kg/m   Physical Exam Vitals and nursing note reviewed.  Constitutional:      General: He is not in acute distress.     Appearance: Normal appearance. He is not ill-appearing, toxic-appearing or diaphoretic.  HENT:     Head: Normocephalic and atraumatic.     Right Ear: External ear normal.     Left Ear: External ear normal.     Nose: Nose normal.     Mouth/Throat:     Mouth: Mucous membranes are moist.     Pharynx: Oropharynx is clear.  No oropharyngeal exudate or posterior oropharyngeal erythema.  Eyes:     Extraocular Movements: Extraocular movements intact.  Cardiovascular:     Rate and Rhythm: Normal rate and regular rhythm.     Pulses: Normal pulses.     Heart sounds: Normal heart sounds. No murmur heard. No friction rub. No gallop.   Pulmonary:     Effort: Pulmonary effort is normal. No respiratory distress.     Breath sounds: Normal breath sounds. No stridor. No wheezing, rhonchi or rales.  Abdominal:     General: Abdomen is flat.     Tenderness: There is no abdominal tenderness.  Musculoskeletal:        General: Swelling and tenderness present. Normal range of motion.     Cervical back: Normal range of motion and neck supple. No tenderness.  Skin:    General: Skin is warm and dry.     Findings: Erythema present.     Comments: Moderate erythema, tenderness, and swelling noted circumferentially across the left forearm. Sensation intact. 2+ radial pulses. Grip strength 5/5.   Neurological:     General: No focal deficit present.     Mental Status: He is alert and oriented to person, place, and time.  Psychiatric:        Mood and Affect: Mood normal.        Behavior: Behavior normal.    ED Results / Procedures / Treatments   Labs (all labs ordered are listed, but only abnormal results are displayed) Labs Reviewed - No data to display  EKG None  Radiology No results found.  Procedures Procedures   Medications Ordered in ED Medications  dexamethasone (DECADRON) injection 10 mg (10 mg Intramuscular Given 07/12/20 0017)    ED Course  I have reviewed the triage vital signs and the  nursing notes.  Pertinent labs & imaging results that were available during my care of the patient were reviewed by me and considered in my medical decision making (see chart for details).    MDM Rules/Calculators/A&P                          Pt is a 65 year old male that presents after a possible insect bite/sting earlier today.  Physical exam reassuring. NVI in the left arm and hand distal to the redness and swelling.   Exam concerning for possible hymenoptera sting vs developing infection. Given the recent sting feel that this is likely a local inflammatory response. He has no other complaints at this time including CP, SOB, facial swelling, n/v/d, or wheezing concerning for anaphylactic reaction.   Pt given IM decadron in the ED. I instructed him to monitor his sx closely and return if they worsen. Recommended additional benadryl tonight and prn for his symptoms. Return to the ED if his sx worsen. If he feels that they haven't improved in 24 hours recommended he begin taking his prescribed antibiotic.  Pt appears stable for d/c and he is agreeable. His questions were answered and he was amicable at the time of d/c.   Final Clinical Impression(s) / ED Diagnoses Final diagnoses:  Insect bite of left forearm, initial encounter   Rx / DC Orders ED Discharge Orders    None       Rayna Sexton, PA-C 07/12/20 1015    Ripley Fraise, MD 07/12/20 2332

## 2020-07-12 NOTE — Discharge Instructions (Signed)
I have given you a decadron injection in the emergency department. This is a strong steroid medication that will help with the pain and inflammation in the left forearm.  Please purchase benadryl on the way home and take a dose this evening. This will also help with your symptoms as well as your difficulty sleeping.   Please continue to monitor your symptoms. If they have not improved by tomorrow evening, please start taking the antibiotic that you were prescribed.  Please return to the ER if your symptoms worsen. It was a pleasure to meet you.

## 2020-07-21 ENCOUNTER — Other Ambulatory Visit: Payer: Self-pay

## 2020-07-21 DIAGNOSIS — I10 Essential (primary) hypertension: Secondary | ICD-10-CM

## 2020-07-21 MED ORDER — LISINOPRIL 40 MG PO TABS: 40.0000 mg | ORAL_TABLET | Freq: Every day | ORAL | 0 refills | Status: DC

## 2020-08-08 ENCOUNTER — Ambulatory Visit: Payer: Medicare Other

## 2020-08-08 ENCOUNTER — Other Ambulatory Visit: Payer: Self-pay

## 2020-08-08 ENCOUNTER — Telehealth: Payer: Self-pay

## 2020-08-08 NOTE — Telephone Encounter (Signed)
Called patient 3 times trying to complete AWV. Patient never answered. Left message on voicemail stating I called and to reschedule at his convenience.

## 2020-09-15 DIAGNOSIS — M25551 Pain in right hip: Secondary | ICD-10-CM | POA: Diagnosis not present

## 2020-09-15 DIAGNOSIS — I739 Peripheral vascular disease, unspecified: Secondary | ICD-10-CM | POA: Diagnosis not present

## 2020-09-20 ENCOUNTER — Telehealth: Payer: Self-pay

## 2020-09-20 DIAGNOSIS — B029 Zoster without complications: Secondary | ICD-10-CM | POA: Diagnosis not present

## 2020-09-20 NOTE — Telephone Encounter (Signed)
Left v/m for pt to cb to [facility]. 

## 2020-09-20 NOTE — Telephone Encounter (Signed)
[facility] Night - Client TELEPHONE ADVICE RECORD [facility] Patient Name: JERRID FORGETTE 65 Gender: Male DOB: 29-Mar-1956 Age: 65 Y 9 M 5 D Return Phone Number: [phone] (Primary), [phone] (Secondary) Address: City/ State/ Zip: [place] [address] Client [place] Night - Client Client Site [facility] - Night Physician Alma Friendly - NP Contact Type Call Who Is Calling Patient / Member / Family / Caregiver Call Type Triage / Clinical Relationship To Patient Self Return Phone Number [phone] (Secondary) Chief Complaint Rash - Localized Reason for Call Symptomatic / Request for [place] states he thinks he has shingles. Started 4-5 days ago. He is unsure what it is at first thought it was a mosquito bite. Translation No Nurse Assessment Nurse: Manson Allan, RN, Sarah Date/Time Eilene Ghazi Time): 09/20/2020 12:48:56 AM Confirm and document reason for call. If symptomatic, describe symptoms. ---Pt. has a rash on his left shoulder that started 4 days ago. Rash is itchy. Pt. does not know what rash looks like he cannot see that area. Does the patient have any new or worsening symptoms? ---Yes Will a triage be completed? ---Yes Related visit to physician within the last 2 weeks? ---No Does the PT have any chronic conditions? (i.e. diabetes, asthma, this includes High risk factors for pregnancy, etc.) ---No Is this a behavioral health or substance abuse call? ---No Guidelines Guideline Title Affirmed Question Affirmed Notes Nurse Date/Time (Eastern Time) Rash or Redness - Localized [1] Looks infected (spreading redness, pus) AND [2] large red area (> 2 in. or 5 cm) Moon, RN, Judson Roch 09/20/2020 12:50:34 AM Disp. Time Eilene Ghazi Time) Disposition Final User 09/20/2020 12:53:11 AM See HCP within 4 Hours (or PCP triage) Yes Manson Allan, RN, Sarah PLEASE NOTE: All  timestamps contained within this report are represented as [place] Standard Time. CONFIDENTIALTY NOTICE: This fax transmission is intended only for the addressee. It contains information that is legally privileged, confidential or otherwise protected from use or disclosure. If you are not the intended recipient, you are strictly prohibited from reviewing, disclosing, copying using or disseminating any of this information or taking any action in reliance on or regarding this information. If you have received this fax in error, please notify us immediately by telephone so that we can arrange for its return to [place]. Phone: [phone], Toll-Free: [phone], Fax: [phone] Page: 2 of 2 Call Id: [id number] [place] Disagree/Comply Comply Caller Understands Yes PreDisposition InappropriateToAsk Care Advice Given Per Guideline SEE HCP (OR PCP TRIAGE) WITHIN 4 HOURS: * IF OFFICE WILL BE CLOSED AND NO PCP (PRIMARY CARE PROVIDER) SECOND-LEVEL TRIAGE: You need to be seen within the next 3 or 4 hours. A nearby Urgent Care Center [facility]) is often a good source of care. Another choice is to go to the ED. Go sooner if you become worse. CALL BACK IF: * You become worse CARE ADVICE given per Rash - Localized and Cause Unknown (Adult) guideline. Referrals [facility] - ED

## 2020-09-20 NOTE — Telephone Encounter (Signed)
Still unable to reach pt by phone.

## 2020-09-26 DIAGNOSIS — I10 Essential (primary) hypertension: Secondary | ICD-10-CM | POA: Diagnosis not present

## 2020-09-26 DIAGNOSIS — Z79899 Other long term (current) drug therapy: Secondary | ICD-10-CM | POA: Diagnosis not present

## 2020-09-26 DIAGNOSIS — M542 Cervicalgia: Secondary | ICD-10-CM | POA: Diagnosis not present

## 2020-09-26 DIAGNOSIS — E559 Vitamin D deficiency, unspecified: Secondary | ICD-10-CM | POA: Diagnosis not present

## 2020-09-26 DIAGNOSIS — Z0000 Encounter for general adult medical examination without abnormal findings: Secondary | ICD-10-CM | POA: Diagnosis not present

## 2020-09-26 DIAGNOSIS — M79604 Pain in right leg: Secondary | ICD-10-CM | POA: Diagnosis not present

## 2020-09-26 DIAGNOSIS — I739 Peripheral vascular disease, unspecified: Secondary | ICD-10-CM | POA: Diagnosis not present

## 2020-09-26 DIAGNOSIS — Z131 Encounter for screening for diabetes mellitus: Secondary | ICD-10-CM | POA: Diagnosis not present

## 2020-09-26 DIAGNOSIS — R5383 Other fatigue: Secondary | ICD-10-CM | POA: Diagnosis not present

## 2020-09-26 DIAGNOSIS — R29898 Other symptoms and signs involving the musculoskeletal system: Secondary | ICD-10-CM | POA: Diagnosis not present

## 2020-09-26 DIAGNOSIS — Z1159 Encounter for screening for other viral diseases: Secondary | ICD-10-CM | POA: Diagnosis not present

## 2020-09-28 DIAGNOSIS — Q8901 Asplenia (congenital): Secondary | ICD-10-CM | POA: Diagnosis not present

## 2020-09-28 DIAGNOSIS — I739 Peripheral vascular disease, unspecified: Secondary | ICD-10-CM | POA: Diagnosis not present

## 2020-09-29 NOTE — Telephone Encounter (Signed)
No. We have left voicemail and called several times.

## 2020-09-29 NOTE — Telephone Encounter (Signed)
Do you want me to keep calling patient? Do not see anything in care everywhere

## 2020-09-29 NOTE — Telephone Encounter (Signed)
Per [name] no further action needed at this time.

## 2020-10-04 DIAGNOSIS — Q8901 Asplenia (congenital): Secondary | ICD-10-CM | POA: Diagnosis not present

## 2020-10-04 DIAGNOSIS — I739 Peripheral vascular disease, unspecified: Secondary | ICD-10-CM | POA: Diagnosis not present

## 2020-10-04 DIAGNOSIS — M5137 Other intervertebral disc degeneration, lumbosacral region: Secondary | ICD-10-CM | POA: Diagnosis not present

## 2020-10-04 DIAGNOSIS — M542 Cervicalgia: Secondary | ICD-10-CM | POA: Diagnosis not present

## 2020-10-12 ENCOUNTER — Other Ambulatory Visit: Payer: Self-pay | Admitting: Primary Care

## 2020-10-12 DIAGNOSIS — I10 Essential (primary) hypertension: Secondary | ICD-10-CM

## 2020-10-20 DIAGNOSIS — M5441 Lumbago with sciatica, right side: Secondary | ICD-10-CM | POA: Diagnosis not present

## 2020-10-20 DIAGNOSIS — M2560 Stiffness of unspecified joint, not elsewhere classified: Secondary | ICD-10-CM | POA: Diagnosis not present

## 2020-10-20 DIAGNOSIS — M5442 Lumbago with sciatica, left side: Secondary | ICD-10-CM | POA: Diagnosis not present

## 2020-10-23 DIAGNOSIS — M2560 Stiffness of unspecified joint, not elsewhere classified: Secondary | ICD-10-CM | POA: Diagnosis not present

## 2020-10-23 DIAGNOSIS — M5442 Lumbago with sciatica, left side: Secondary | ICD-10-CM | POA: Diagnosis not present

## 2020-10-23 DIAGNOSIS — M5441 Lumbago with sciatica, right side: Secondary | ICD-10-CM | POA: Diagnosis not present

## 2020-10-26 DIAGNOSIS — M5442 Lumbago with sciatica, left side: Secondary | ICD-10-CM | POA: Diagnosis not present

## 2020-10-26 DIAGNOSIS — M5441 Lumbago with sciatica, right side: Secondary | ICD-10-CM | POA: Diagnosis not present

## 2020-10-26 DIAGNOSIS — M2560 Stiffness of unspecified joint, not elsewhere classified: Secondary | ICD-10-CM | POA: Diagnosis not present

## 2020-10-27 DIAGNOSIS — M5441 Lumbago with sciatica, right side: Secondary | ICD-10-CM | POA: Diagnosis not present

## 2020-10-27 DIAGNOSIS — M2560 Stiffness of unspecified joint, not elsewhere classified: Secondary | ICD-10-CM | POA: Diagnosis not present

## 2020-10-27 DIAGNOSIS — M5442 Lumbago with sciatica, left side: Secondary | ICD-10-CM | POA: Diagnosis not present

## 2020-10-30 DIAGNOSIS — M2560 Stiffness of unspecified joint, not elsewhere classified: Secondary | ICD-10-CM | POA: Diagnosis not present

## 2020-10-30 DIAGNOSIS — M5441 Lumbago with sciatica, right side: Secondary | ICD-10-CM | POA: Diagnosis not present

## 2020-10-30 DIAGNOSIS — M5442 Lumbago with sciatica, left side: Secondary | ICD-10-CM | POA: Diagnosis not present

## 2020-11-01 DIAGNOSIS — M5442 Lumbago with sciatica, left side: Secondary | ICD-10-CM | POA: Diagnosis not present

## 2020-11-01 DIAGNOSIS — M5441 Lumbago with sciatica, right side: Secondary | ICD-10-CM | POA: Diagnosis not present

## 2020-11-01 DIAGNOSIS — M2560 Stiffness of unspecified joint, not elsewhere classified: Secondary | ICD-10-CM | POA: Diagnosis not present

## 2020-11-02 DIAGNOSIS — M5441 Lumbago with sciatica, right side: Secondary | ICD-10-CM | POA: Diagnosis not present

## 2020-11-02 DIAGNOSIS — M2560 Stiffness of unspecified joint, not elsewhere classified: Secondary | ICD-10-CM | POA: Diagnosis not present

## 2020-11-02 DIAGNOSIS — M5442 Lumbago with sciatica, left side: Secondary | ICD-10-CM | POA: Diagnosis not present

## 2020-11-07 DIAGNOSIS — M5441 Lumbago with sciatica, right side: Secondary | ICD-10-CM | POA: Diagnosis not present

## 2020-11-07 DIAGNOSIS — M5442 Lumbago with sciatica, left side: Secondary | ICD-10-CM | POA: Diagnosis not present

## 2020-11-07 DIAGNOSIS — M2560 Stiffness of unspecified joint, not elsewhere classified: Secondary | ICD-10-CM | POA: Diagnosis not present

## 2020-11-09 DIAGNOSIS — M5441 Lumbago with sciatica, right side: Secondary | ICD-10-CM | POA: Diagnosis not present

## 2020-11-09 DIAGNOSIS — M2560 Stiffness of unspecified joint, not elsewhere classified: Secondary | ICD-10-CM | POA: Diagnosis not present

## 2020-11-09 DIAGNOSIS — M5442 Lumbago with sciatica, left side: Secondary | ICD-10-CM | POA: Diagnosis not present

## 2020-11-10 DIAGNOSIS — M5441 Lumbago with sciatica, right side: Secondary | ICD-10-CM | POA: Diagnosis not present

## 2020-11-10 DIAGNOSIS — M5442 Lumbago with sciatica, left side: Secondary | ICD-10-CM | POA: Diagnosis not present

## 2020-11-10 DIAGNOSIS — M2560 Stiffness of unspecified joint, not elsewhere classified: Secondary | ICD-10-CM | POA: Diagnosis not present

## 2020-11-13 DIAGNOSIS — M5441 Lumbago with sciatica, right side: Secondary | ICD-10-CM | POA: Diagnosis not present

## 2020-11-13 DIAGNOSIS — L0291 Cutaneous abscess, unspecified: Secondary | ICD-10-CM | POA: Diagnosis not present

## 2020-11-13 DIAGNOSIS — M5442 Lumbago with sciatica, left side: Secondary | ICD-10-CM | POA: Diagnosis not present

## 2020-11-13 DIAGNOSIS — M2560 Stiffness of unspecified joint, not elsewhere classified: Secondary | ICD-10-CM | POA: Diagnosis not present

## 2020-11-16 DIAGNOSIS — M5441 Lumbago with sciatica, right side: Secondary | ICD-10-CM | POA: Diagnosis not present

## 2020-11-16 DIAGNOSIS — M5442 Lumbago with sciatica, left side: Secondary | ICD-10-CM | POA: Diagnosis not present

## 2020-11-16 DIAGNOSIS — M2560 Stiffness of unspecified joint, not elsewhere classified: Secondary | ICD-10-CM | POA: Diagnosis not present

## 2020-11-17 DIAGNOSIS — M2560 Stiffness of unspecified joint, not elsewhere classified: Secondary | ICD-10-CM | POA: Diagnosis not present

## 2020-11-17 DIAGNOSIS — M5442 Lumbago with sciatica, left side: Secondary | ICD-10-CM | POA: Diagnosis not present

## 2020-11-17 DIAGNOSIS — M5441 Lumbago with sciatica, right side: Secondary | ICD-10-CM | POA: Diagnosis not present

## 2020-11-20 DIAGNOSIS — M5442 Lumbago with sciatica, left side: Secondary | ICD-10-CM | POA: Diagnosis not present

## 2020-11-20 DIAGNOSIS — M5441 Lumbago with sciatica, right side: Secondary | ICD-10-CM | POA: Diagnosis not present

## 2020-11-20 DIAGNOSIS — Q8901 Asplenia (congenital): Secondary | ICD-10-CM | POA: Diagnosis not present

## 2020-11-20 DIAGNOSIS — L0291 Cutaneous abscess, unspecified: Secondary | ICD-10-CM | POA: Diagnosis not present

## 2020-11-20 DIAGNOSIS — M542 Cervicalgia: Secondary | ICD-10-CM | POA: Diagnosis not present

## 2020-11-20 DIAGNOSIS — M2560 Stiffness of unspecified joint, not elsewhere classified: Secondary | ICD-10-CM | POA: Diagnosis not present

## 2020-11-20 DIAGNOSIS — R7303 Prediabetes: Secondary | ICD-10-CM | POA: Diagnosis not present

## 2020-11-22 ENCOUNTER — Ambulatory Visit: Payer: Medicare Other | Admitting: Primary Care

## 2020-11-22 DIAGNOSIS — M5442 Lumbago with sciatica, left side: Secondary | ICD-10-CM | POA: Diagnosis not present

## 2020-11-22 DIAGNOSIS — M5441 Lumbago with sciatica, right side: Secondary | ICD-10-CM | POA: Diagnosis not present

## 2020-11-22 DIAGNOSIS — M2560 Stiffness of unspecified joint, not elsewhere classified: Secondary | ICD-10-CM | POA: Diagnosis not present

## 2020-11-27 DIAGNOSIS — M2560 Stiffness of unspecified joint, not elsewhere classified: Secondary | ICD-10-CM | POA: Diagnosis not present

## 2020-11-27 DIAGNOSIS — M5442 Lumbago with sciatica, left side: Secondary | ICD-10-CM | POA: Diagnosis not present

## 2020-11-27 DIAGNOSIS — M5441 Lumbago with sciatica, right side: Secondary | ICD-10-CM | POA: Diagnosis not present

## 2020-11-29 DIAGNOSIS — M2560 Stiffness of unspecified joint, not elsewhere classified: Secondary | ICD-10-CM | POA: Diagnosis not present

## 2020-11-29 DIAGNOSIS — M5441 Lumbago with sciatica, right side: Secondary | ICD-10-CM | POA: Diagnosis not present

## 2020-11-29 DIAGNOSIS — M5442 Lumbago with sciatica, left side: Secondary | ICD-10-CM | POA: Diagnosis not present

## 2020-11-29 LAB — HM DIABETES EYE EXAM

## 2020-11-30 ENCOUNTER — Other Ambulatory Visit: Payer: Self-pay | Admitting: Primary Care

## 2020-11-30 ENCOUNTER — Ambulatory Visit: Payer: Medicare Other | Admitting: Primary Care

## 2020-11-30 DIAGNOSIS — F32A Depression, unspecified: Secondary | ICD-10-CM

## 2020-11-30 DIAGNOSIS — G8929 Other chronic pain: Secondary | ICD-10-CM

## 2020-11-30 DIAGNOSIS — F419 Anxiety disorder, unspecified: Secondary | ICD-10-CM

## 2020-12-01 NOTE — Telephone Encounter (Signed)
Patient missed his appointment on 09/01 for diabetes follow up and needs to be scheduled. Please schedule and remove him from the schedule on 09/01 so that he won't get a no show fee.

## 2020-12-06 DIAGNOSIS — M2560 Stiffness of unspecified joint, not elsewhere classified: Secondary | ICD-10-CM | POA: Diagnosis not present

## 2020-12-06 DIAGNOSIS — M5442 Lumbago with sciatica, left side: Secondary | ICD-10-CM | POA: Diagnosis not present

## 2020-12-06 DIAGNOSIS — M5441 Lumbago with sciatica, right side: Secondary | ICD-10-CM | POA: Diagnosis not present

## 2020-12-07 ENCOUNTER — Other Ambulatory Visit: Payer: Self-pay

## 2020-12-07 ENCOUNTER — Ambulatory Visit (INDEPENDENT_AMBULATORY_CARE_PROVIDER_SITE_OTHER): Payer: Medicare Other | Admitting: Primary Care

## 2020-12-07 ENCOUNTER — Encounter: Payer: Self-pay | Admitting: Primary Care

## 2020-12-07 ENCOUNTER — Telehealth: Payer: Self-pay | Admitting: Primary Care

## 2020-12-07 VITALS — BP 118/68 | HR 98 | Temp 98.7°F | Ht 68.0 in | Wt 222.0 lb

## 2020-12-07 DIAGNOSIS — Z23 Encounter for immunization: Secondary | ICD-10-CM

## 2020-12-07 DIAGNOSIS — R200 Anesthesia of skin: Secondary | ICD-10-CM | POA: Diagnosis not present

## 2020-12-07 DIAGNOSIS — E119 Type 2 diabetes mellitus without complications: Secondary | ICD-10-CM

## 2020-12-07 LAB — POCT GLYCOSYLATED HEMOGLOBIN (HGB A1C): Hemoglobin A1C: 6.2 % — AB (ref 4.0–5.6)

## 2020-12-07 MED ORDER — METFORMIN HCL 500 MG PO TABS: 500.0000 mg | ORAL_TABLET | Freq: Two times a day (BID) | ORAL | 3 refills | Status: DC

## 2020-12-07 NOTE — Patient Instructions (Addendum)
Continue metformin 500 mg twice daily for diabetes.   We will see you back in 6 months for your annual physical.  It was a pleasure to see you today!   Influenza (Flu) Vaccine (Inactivated or Recombinant): What You Need to Know 1. Why get vaccinated? Influenza vaccine can prevent influenza (flu). Flu is a contagious disease that spreads around the [place] every year, usually between October and May. Anyone can get the flu, but it is more dangerous for some people. Infants and young children, people 21 years and older, pregnant people, and people with certain health conditions or a weakened immune system are at greatest risk of flu complications. Pneumonia, bronchitis, sinus infections, and ear infections are examples of flu-related complications. If you have a medical condition, such as heart disease, cancer, or diabetes, flu can make it worse. Flu can cause fever and chills, sore throat, muscle aches, fatigue, cough, headache, and runny or stuffy nose. Some people may have vomiting and diarrhea, though this is more common in children than adults. In an average year, thousands of people in the [place] die from flu, and many more are hospitalized. Flu vaccine prevents millions of illnesses and flu-related visits to the doctor each year. 2. Influenza vaccines CDC recommends everyone 6 months and older get vaccinated every flu season. Children 6 months through 61 years of age may need 2 doses during a single flu season. Everyone else needs only 1 dose each flu season. It takes about 2 weeks for protection to develop after vaccination. There are many flu viruses, and they are always changing. Each year a new flu vaccine is made to protect against the influenza viruses believed to be likely to cause disease in the upcoming flu season. Even when the vaccine doesn't exactly match these viruses, it may still provide some protection. Influenza vaccine does not cause flu. Influenza vaccine may be  given at the same time as other vaccines. 3. Talk with your health care provider Tell your vaccination provider if the person getting the vaccine: Has had an allergic reaction after a previous dose of influenza vaccine, or has any severe, life-threatening allergies Has ever had Guillain-Barr Syndrome (also called "GBS") In some cases, your health care provider may decide to postpone influenza vaccination until a future visit. Influenza vaccine can be administered at any time during pregnancy. People who are or will be pregnant during influenza season should receive inactivated influenza vaccine. People with minor illnesses, such as a cold, may be vaccinated. People who are moderately or severely ill should usually wait until they recover before getting influenza vaccine. Your health care provider can give you more information. 4. Risks of a vaccine reaction Soreness, redness, and swelling where the shot is given, fever, muscle aches, and headache can happen after influenza vaccination. There may be a very small increased risk of Guillain-Barr Syndrome (GBS) after inactivated influenza vaccine (the flu shot). Young children who get the flu shot along with pneumococcal vaccine (PCV13) and/or DTaP vaccine at the same time might be slightly more likely to have a seizure caused by fever. Tell your health care provider if a child who is getting flu vaccine has ever had a seizure. People sometimes faint after medical procedures, including vaccination. Tell your provider if you feel dizzy or have vision changes or ringing in the ears. As with any medicine, there is a very remote chance of a vaccine causing a severe allergic reaction, other serious injury, or death. 5. What if there is  a serious problem? An allergic reaction could occur after the vaccinated person leaves the clinic. If you see signs of a severe allergic reaction (hives, swelling of the face and throat, difficulty breathing, a fast  heartbeat, dizziness, or weakness), call 9-1-1 and get the person to the nearest hospital. For other signs that concern you, call your health care provider. Adverse reactions should be reported to the Vaccine Adverse Event Reporting System (VAERS). Your health care provider will usually file this report, or you can do it yourself. Visit the VAERS website at www.vaers.SamedayNews.es or call (616)178-6912. VAERS is only for reporting reactions, and VAERS staff members do not give medical advice. 6. The National Vaccine Injury Compensation Program The [employer] Vaccine Injury Compensation Program (VICP) is a federal program that was created to compensate people who may have been injured by certain vaccines. Claims regarding alleged injury or death due to vaccination have a time limit for filing, which may be as short as two years. Visit the VICP website at GoldCloset.com.ee or call 984-518-5111 to learn about the program and about filing a claim. 7. How can I learn more? Ask your health care provider. Call your local or state health department. Visit the website of the Food and Drug Administration (FDA) for vaccine package inserts and additional information at TraderRating.uy. Contact the Centers for Disease Control and Prevention (CDC): Call 808-219-1952 (1-800-CDC-INFO) or Visit CDC's website at https://gibson.com/. Vaccine Information Statement Inactivated Influenza Vaccine (11/05/2019) This information is not intended to replace advice given to you by your health care provider. Make sure you discuss any questions you have with your health care provider. Document Revised: 12/23/2019 Document Reviewed: 12/23/2019 Elsevier Patient Education  2022 [employer].

## 2020-12-07 NOTE — Progress Notes (Signed)
Subjective:    Patient ID: Kenneth [name], male    DOB: 05/03/1955, 65 y.o.   MRN: [id number]  HPI  Kenneth [name] is a very pleasant 65 y.o. male with a history of hypertension, OSA, type 2 diabetes, hyperlipidemia, tobacco abuse, vitamin B12 deficiency who presents today for follow up of diabetes.  Current medications include: Metformin 500 mg BID.  He is checking his blood glucose once monthly and is getting readings of high 90's to low 100's.  Last A1C: 6.6 in February 2022, 6.2 today Last Eye Exam: UTD Last Foot Exam: Due today Pneumonia Vaccination: 2020 Urine Microalbumin: lisinopril  Statin: atorvastatin   Dietary changes since last visit: He endorses a fair diet.    Exercise: He is active.   BP Readings from Last 3 Encounters:  12/07/20 118/68  07/12/20 (!) 151/86  07/11/20 (!) 150/78         Review of Systems  Eyes:  Negative for visual disturbance.  Respiratory:  Negative for shortness of breath.   Cardiovascular:  Negative for chest pain.  Neurological:  Negative for numbness.        Past Medical History:  Diagnosis Date   Anxiety and depression    Bipolar 1 disorder ([place])    Blood transfusion without reported diagnosis    Chronic kidney disease    Depression    GERD (gastroesophageal reflux disease)    Hypercholesterolemia    Hypertension    Sleep apnea    Type 2 diabetes mellitus ([place])     Social History   Socioeconomic History   Marital status: Divorced    Spouse name: Not on file   Number of children: Not on file   Years of education: Not on file   Highest education level: Not on file  Occupational History   Not on file  Tobacco Use   Smoking status: Every Day    Packs/day: 1.00    Years: 41.00    Pack years: 41.00    Types: Cigarettes   Smokeless tobacco: Never  Vaping Use   Vaping Use: Never used  Substance and Sexual Activity   Alcohol use: No   Drug use: No   Sexual activity: Not on file  Other Topics Concern    Not on file  Social History Narrative   Single.   1 daughter, 1 grandchild.   Retired. Once worked in [employer].   Enjoys sports, racing, traveling.    Social Determinants of Health   Financial Resource Strain: Not on file  Food Insecurity: Not on file  Transportation Needs: Not on file  Physical Activity: Not on file  Stress: Not on file  Social Connections: Not on file  Intimate Partner Violence: Not on file    Past Surgical History:  Procedure Laterality Date   L hand surgery     R knee arthoroscopy     SPLENECTOMY      Family History  Problem Relation Age of Onset   Hypertension Mother    Mental illness Mother    Diabetes Mother    Hypertension Father    Lung cancer Father    Diabetes Paternal Grandmother    Colon cancer Maternal Uncle    Esophageal cancer Neg Hx    Liver cancer Neg Hx    Pancreatic cancer Neg Hx    Rectal cancer Neg Hx    Stomach cancer Neg Hx     No Known Allergies  Current Outpatient Medications on File Prior to Visit  Medication Sig Dispense Refill   Accu-Chek FastClix Lancets MISC USE TO CHECK BLOOD SUGAR UP TO 4 TIMES A DAY 100 each 3   ACCU-CHEK GUIDE test strip USE TO CHECK UP TO 4 TIMES A DAY 100 strip 3   Ascorbic Acid (VITAMIN C) 1000 MG tablet Take 1,000 mg by mouth daily.     atenolol (TENORMIN) 50 MG tablet TAKE 1 TABLET BY MOUTH EVERY DAY 90 tablet 1   atorvastatin (LIPITOR) 20 MG tablet TAKE 1 TABLET BY MOUTH EVERY DAY IN THE EVENING 90 tablet 1   blood glucose meter kit and supplies KIT Dispense based on patient and insurance preference. Use up to four times daily as directed. (FOR ICD-9 250.00, 250.01). (Patient not taking: Reported on 05/25/2020) 1 each 0   Cyanocobalamin (VITAMIN B-12 PO) Take 1 tablet by mouth daily.     diclofenac (VOLTAREN) 75 MG EC tablet Take 1 tablet (75 mg total) by mouth daily as needed for moderate pain. 90 tablet 0   fluticasone (FLONASE) 50 MCG/ACT nasal spray SPRAY 2 SPRAYS INTO EACH NOSTRIL EVERY DAY  16 mL 3   GARLIC PO Take 1 tablet by mouth daily.      Ginseng (GIN-ZING PO) Take 1 capsule by mouth daily.  (Patient not taking: Reported on 05/25/2020)     hydrochlorothiazide (HYDRODIURIL) 25 MG tablet TAKE 1 TABLET BY MOUTH EVERY DAY 90 tablet 1   hydrOXYzine (ATARAX/VISTARIL) 25 MG tablet Take 1 tablet (25 mg total) by mouth every 6 (six) hours. 30 tablet 0   lisinopril (ZESTRIL) 40 MG tablet TAKE 1 TABLET BY MOUTH EVERY DAY 90 tablet 0   metFORMIN (GLUCOPHAGE) 500 MG tablet Take 1 tablet (500 mg total) by mouth 2 (two) times daily with a meal. For diabetes. 180 tablet 3   methocarbamol (ROBAXIN) 500 MG tablet Take 1 tablet (500 mg total) by mouth daily as needed for muscle spasms. 30 tablet 0   Multiple Vitamins-Minerals (MULTIVITAMIN ADULT) TABS Take 1 tablet by mouth daily.     Omega-3 Fatty Acids (OMEGA 3 PO) Take 1 capsule by mouth daily.     sertraline (ZOLOFT) 50 MG tablet Take 1 tablet (50 mg total) by mouth daily. For anxiety and depression. 90 tablet 1   SHINGRIX injection  (Patient not taking: Reported on 05/25/2020)     Current Facility-Administered Medications on File Prior to Visit  Medication Dose Route Frequency Provider Last Rate Last Admin   0.9 %  sodium chloride infusion  500 mL Intravenous Once Ladene Artist, MD        BP 118/68   Pulse 98   Temp 98.7 F (37.1 C) (Temporal)   Ht _0  (1.727 m)   Wt 222 lb (100.7 kg)   SpO2 98%   BMI 33.75 kg/m  Objective:   Physical Exam Cardiovascular:     Rate and Rhythm: Normal rate and regular rhythm.  Pulmonary:     Effort: Pulmonary effort is normal.     Breath sounds: Normal breath sounds. No wheezing or rales.  Musculoskeletal:     Cervical back: Neck supple.  Skin:    General: Skin is warm and dry.     Findings: No erythema.     Comments: Abrasions to bilateral lower extremities.   Neurological:     Mental Status: He is alert and oriented to person, place, and time.          Assessment & Plan:       This visit  occurred during the SARS-CoV-2 public health emergency.  Safety protocols were in place, including screening questions prior to the visit, additional usage of staff PPE, and extensive cleaning of exam room while observing appropriate contact time as indicated for disinfecting solutions.

## 2020-12-07 NOTE — Telephone Encounter (Signed)
Results given in office today. No further action needed at this time.

## 2020-12-07 NOTE — Assessment & Plan Note (Signed)
Intermittent, has been told this is secondary to his back. Currently active in PT and improving.

## 2020-12-07 NOTE — Telephone Encounter (Signed)
Please notify patient that his diabetic eye exam looked good. Will repeat this again in 1 year. Exam abstracted and sent off for scanning.

## 2020-12-07 NOTE — Assessment & Plan Note (Signed)
Compliant to Metformin 500 mg BID, A1C today 6.2 which is an improvement and well controlled.   Foot exam today. Eye exam UTD. Pneumonia vaccine UTD. Managed on ACE-I and statin.  Follow up in 6 months.

## 2020-12-08 DIAGNOSIS — M5442 Lumbago with sciatica, left side: Secondary | ICD-10-CM | POA: Diagnosis not present

## 2020-12-08 DIAGNOSIS — M5441 Lumbago with sciatica, right side: Secondary | ICD-10-CM | POA: Diagnosis not present

## 2020-12-08 DIAGNOSIS — M2560 Stiffness of unspecified joint, not elsewhere classified: Secondary | ICD-10-CM | POA: Diagnosis not present

## 2020-12-12 DIAGNOSIS — M2560 Stiffness of unspecified joint, not elsewhere classified: Secondary | ICD-10-CM | POA: Diagnosis not present

## 2020-12-12 DIAGNOSIS — M5442 Lumbago with sciatica, left side: Secondary | ICD-10-CM | POA: Diagnosis not present

## 2020-12-12 DIAGNOSIS — M5441 Lumbago with sciatica, right side: Secondary | ICD-10-CM | POA: Diagnosis not present

## 2020-12-13 DIAGNOSIS — M2560 Stiffness of unspecified joint, not elsewhere classified: Secondary | ICD-10-CM | POA: Diagnosis not present

## 2020-12-13 DIAGNOSIS — M5442 Lumbago with sciatica, left side: Secondary | ICD-10-CM | POA: Diagnosis not present

## 2020-12-13 DIAGNOSIS — M5441 Lumbago with sciatica, right side: Secondary | ICD-10-CM | POA: Diagnosis not present

## 2020-12-14 DIAGNOSIS — R5383 Other fatigue: Secondary | ICD-10-CM | POA: Diagnosis not present

## 2020-12-14 DIAGNOSIS — E0590 Thyrotoxicosis, unspecified without thyrotoxic crisis or storm: Secondary | ICD-10-CM | POA: Diagnosis not present

## 2020-12-14 DIAGNOSIS — R7303 Prediabetes: Secondary | ICD-10-CM | POA: Diagnosis not present

## 2020-12-14 DIAGNOSIS — Z79899 Other long term (current) drug therapy: Secondary | ICD-10-CM | POA: Diagnosis not present

## 2020-12-14 DIAGNOSIS — Q8901 Asplenia (congenital): Secondary | ICD-10-CM | POA: Diagnosis not present

## 2020-12-20 DIAGNOSIS — M5442 Lumbago with sciatica, left side: Secondary | ICD-10-CM | POA: Diagnosis not present

## 2020-12-20 DIAGNOSIS — M5441 Lumbago with sciatica, right side: Secondary | ICD-10-CM | POA: Diagnosis not present

## 2020-12-20 DIAGNOSIS — M2560 Stiffness of unspecified joint, not elsewhere classified: Secondary | ICD-10-CM | POA: Diagnosis not present

## 2020-12-22 DIAGNOSIS — I10 Essential (primary) hypertension: Secondary | ICD-10-CM | POA: Diagnosis not present

## 2020-12-22 DIAGNOSIS — M48062 Spinal stenosis, lumbar region with neurogenic claudication: Secondary | ICD-10-CM | POA: Diagnosis not present

## 2020-12-25 DIAGNOSIS — M2560 Stiffness of unspecified joint, not elsewhere classified: Secondary | ICD-10-CM | POA: Diagnosis not present

## 2020-12-25 DIAGNOSIS — M5441 Lumbago with sciatica, right side: Secondary | ICD-10-CM | POA: Diagnosis not present

## 2020-12-25 DIAGNOSIS — M5442 Lumbago with sciatica, left side: Secondary | ICD-10-CM | POA: Diagnosis not present

## 2020-12-27 DIAGNOSIS — M2560 Stiffness of unspecified joint, not elsewhere classified: Secondary | ICD-10-CM | POA: Diagnosis not present

## 2020-12-27 DIAGNOSIS — M5442 Lumbago with sciatica, left side: Secondary | ICD-10-CM | POA: Diagnosis not present

## 2020-12-27 DIAGNOSIS — M5441 Lumbago with sciatica, right side: Secondary | ICD-10-CM | POA: Diagnosis not present

## 2021-02-01 ENCOUNTER — Telehealth: Payer: Self-pay

## 2021-02-01 NOTE — Telephone Encounter (Signed)
Received refill from [facility] mail order for patient. Received from [facility]. Is not on patient pharmacy list. Called and left message for patient to verify that he wanted to use this pharmacy.   Ask if he has called to transfer scriopts that he has had called in. Most were sent in for one year recently   Requested medications:

## 2021-02-13 NOTE — Telephone Encounter (Signed)
No answer no voice mail  

## 2021-02-19 NOTE — Telephone Encounter (Signed)
Left message to return call to our office.  

## 2021-02-26 ENCOUNTER — Telehealth: Payer: Self-pay | Admitting: Primary Care

## 2021-02-26 NOTE — Chronic Care Management (AMB) (Signed)
  Chronic Care Management   Note  02/26/2021 Name: Kenneth [name] MRN: [id number] DOB: February 10, 1956  Kenneth [name] is a 65 y.o. year old male who is a primary care patient of Pleas Koch, NP. I reached out to Sharmaine Base by phone today in response to a referral sent by Kenneth [name] PCP, Pleas Koch, NP.   [name] was given information about Chronic Care Management services today including:  CCM service includes personalized support from designated clinical staff supervised by his physician, including individualized plan of care and coordination with other care providers 24/7 contact phone numbers for assistance for urgent and routine care needs. Service will only be billed when office clinical staff spend 20 minutes or more in a month to coordinate care. Only one practitioner may furnish and bill the service in a calendar month. The patient may stop CCM services at any time (effective at the end of the month) by phone call to the office staff.   Patient agreed to services and verbal consent obtained.   Follow up plan:   Tatjana Secretary/administrator

## 2021-03-02 NOTE — Telephone Encounter (Signed)
Have not been able to reach patient.

## 2021-03-20 ENCOUNTER — Ambulatory Visit (HOSPITAL_COMMUNITY)
Admission: EM | Admit: 2021-03-20 | Discharge: 2021-03-20 | Disposition: A | Discharge status: Home / Self Care | Payer: Medicare (Managed Care) | Attending: Family Medicine | Admitting: Family Medicine

## 2021-03-20 ENCOUNTER — Encounter (HOSPITAL_COMMUNITY): Payer: Self-pay | Admitting: Emergency Medicine

## 2021-03-20 ENCOUNTER — Other Ambulatory Visit: Payer: Self-pay

## 2021-03-20 DIAGNOSIS — L989 Disorder of the skin and subcutaneous tissue, unspecified: Secondary | ICD-10-CM | POA: Diagnosis not present

## 2021-03-20 NOTE — ED Provider Notes (Signed)
Kenneth [name]    CSN: [id number] Arrival date & time: 03/20/21  1849      History   Chief Complaint Chief Complaint  Patient presents with   Skin Growth     HPI Kenneth [name] is a 65 y.o. male.   HPI Here with a history of a skin lesion growing in the last 2 weeks or so.  Several months ago he had been his dermatologist and he had had a flat dry skin lesion on the dorsum of his left hand.  At that point the dermatologist did not feel any intervention needed to be done.  He also had several similar lesions on his left forearm.  Then 2 weeks ago he started noticing the lesion become taller on his skin and growing it is not painful, it is not red nor has it drained anything.  No fever or chills.  He does have an appointment with his dermatologist for January 3 he had had a snafu with his insurance but that has been fixed.  Past Medical History:  Diagnosis Date   Anxiety and depression    Bipolar 1 disorder ([place])    Blood transfusion without reported diagnosis    Chronic kidney disease    Depression    GERD (gastroesophageal reflux disease)    Hypercholesterolemia    Hypertension    Sleep apnea    Type 2 diabetes mellitus ([place])     Patient Active Problem List   Diagnosis Date Noted   Acute thoracic back pain 05/14/2019   Lower extremity numbness 04/17/2018   Rhinorrhea 04/08/2018   Vitamin B 12 deficiency 07/28/2017   Tobacco abuse 05/05/2017   Preventative health care 04/24/2017   Numbness and tingling 04/15/2017   OSA (obstructive sleep apnea) 02/07/2017   Epigastric abdominal tenderness without rebound tenderness 09/10/2016   GERD without esophagitis 09/10/2016   Nonspecific abnormal electrocardiogram (ECG) (EKG) 09/10/2016   Lower extremity edema 08/12/2016   Type 2 diabetes mellitus ([place]) 06/26/2016   Anxiety and depression 06/26/2016   Hypertension 06/26/2016   Hyperlipidemia 06/26/2016   Chronic neck pain 06/26/2016    Past Surgical History:   Procedure Laterality Date   L hand surgery     R knee arthoroscopy     SPLENECTOMY         Home Medications    Prior to Admission medications   Medication Sig Start Date End Date Taking? Authorizing Provider  atenolol (TENORMIN) 50 MG tablet TAKE 1 TABLET BY MOUTH EVERY DAY 01/21/20  Yes Pleas Koch, NP  atorvastatin (LIPITOR) 20 MG tablet TAKE 1 TABLET BY MOUTH EVERY DAY IN THE EVENING 02/11/20  Yes Pleas Koch, NP  diclofenac (VOLTAREN) 75 MG EC tablet Take 1 tablet (75 mg total) by mouth daily as needed for moderate pain. 12/01/20  Yes Pleas Koch, NP  hydrochlorothiazide (HYDRODIURIL) 25 MG tablet TAKE 1 TABLET BY MOUTH EVERY DAY 01/25/20  Yes Pleas Koch, NP  lisinopril (ZESTRIL) 40 MG tablet TAKE 1 TABLET BY MOUTH EVERY DAY 10/12/20  Yes Lesleigh Noe, MD  metFORMIN (GLUCOPHAGE) 500 MG tablet Take 1 tablet (500 mg total) by mouth 2 (two) times daily with a meal. For diabetes. 12/07/20  Yes Pleas Koch, NP  Multiple Vitamins-Minerals (MULTIVITAMIN ADULT) TABS Take 1 tablet by mouth daily.   Yes [provider]  Omega-3 Fatty Acids (OMEGA 3 PO) Take 1 capsule by mouth daily.   Yes [provider]  sertraline (ZOLOFT) 50 MG  tablet Take 1 tablet (50 mg total) by mouth daily. For anxiety and depression. 12/01/20  Yes Pleas Koch, NP  Accu-Chek FastClix Lancets MISC USE TO CHECK BLOOD SUGAR UP TO 4 TIMES A DAY 04/16/19   Pleas Koch, NP  ACCU-CHEK GUIDE test strip USE TO CHECK UP TO 4 TIMES A DAY 04/16/19   Pleas Koch, NP  Ascorbic Acid (VITAMIN C) 1000 MG tablet Take 1,000 mg by mouth daily.    [provider]  blood glucose meter kit and supplies KIT Dispense based on patient and insurance preference. Use up to four times daily as directed. (FOR ICD-9 250.00, 250.01). 11/18/18   Pleas Koch, NP  Cyanocobalamin (VITAMIN B-12 PO) Take 1 tablet by mouth daily.    [provider]  fluticasone  (FLONASE) 50 MCG/ACT nasal spray SPRAY 2 SPRAYS INTO EACH NOSTRIL EVERY DAY 03/13/20   Pleas Koch, NP  GARLIC PO Take 1 tablet by mouth daily.     [provider]  Ginseng (GIN-ZING PO) Take 1 capsule by mouth daily.  Patient not taking: No sig reported    [provider]  hydrOXYzine (ATARAX/VISTARIL) 25 MG tablet Take 1 tablet (25 mg total) by mouth every 6 (six) hours. 07/11/20   Pearson Forster, NP  methocarbamol (ROBAXIN) 500 MG tablet Take 1 tablet (500 mg total) by mouth daily as needed for muscle spasms. 12/01/20   Pleas Koch, NP    Family History Family History  Problem Relation Age of Onset   Hypertension Mother    Mental illness Mother    Diabetes Mother    Hypertension Father    Lung cancer Father    Diabetes Paternal Grandmother    Colon cancer Maternal Uncle    Esophageal cancer Neg Hx    Liver cancer Neg Hx    Pancreatic cancer Neg Hx    Rectal cancer Neg Hx    Stomach cancer Neg Hx     Social History Social History   Tobacco Use   Smoking status: Every Day    Packs/day: 1.00    Years: 41.00    Pack years: 41.00    Types: Cigarettes   Smokeless tobacco: Never  Vaping Use   Vaping Use: Never used  Substance Use Topics   Alcohol use: No   Drug use: No     Allergies   Patient has no known allergies.   Review of Systems Review of Systems   Physical Exam Triage Vital Signs ED Triage Vitals  Enc Vitals Group     BP 03/20/21 1921 (!) 176/74     Pulse Rate 03/20/21 1921 68     Resp 03/20/21 1921 16     Temp 03/20/21 1921 97.9 F (36.6 C)     Temp Source 03/20/21 1921 Oral     SpO2 03/20/21 1921 97 %     Weight --      Height --      Head Circumference --      Peak Flow --      Pain Score 03/20/21 1927 3     Pain Loc --      Pain Edu? --      Excl. in [place]? --    No data found.  Updated Vital Signs BP (!) 176/74 (BP Location: Left Arm)    Pulse 68    Temp 97.9 F (36.6 C) (Oral)    Resp 16    SpO2 97%  Visual Acuity Right Eye Distance:   Left Eye Distance:   Bilateral Distance:    Right Eye Near:   Left Eye Near:    Bilateral Near:     Physical Exam Vitals reviewed.  Constitutional:      General: He is not in acute distress.    Appearance: He is not toxic-appearing.  Skin:    Comments: Has a raised, scaly lesion about 1.5 cm on the dorsum of his left hand. Has a cornified, horn type raised part in the center, protruding from the base. No purulence. No erythema. Nontender, and is not boggy. He does also have several more flat scaly gray lesions, c/w seborrheic keratoses, on his forearm  Neurological:     Mental Status: He is alert and oriented to person, place, and time.  Psychiatric:        Behavior: Behavior normal.     UC Treatments / Results  Labs (all labs ordered are listed, but only abnormal results are displayed) Labs Reviewed - No data to display  EKG   Radiology No results found.  Procedures Procedures (including critical care time)  Medications Ordered in UC Medications - No data to display  Initial Impression / Assessment and Plan / UC Course  I have reviewed the triage vital signs and the nursing notes.  Pertinent labs & imaging results that were available during my care of the patient were reviewed by me and considered in my medical decision making (see chart for details).     Discussed care of the lesion, and to return if starts being red, more irritated, draining. Final Clinical Impressions(s) / UC Diagnoses   Final diagnoses:  Skin lesion     Discharge Instructions      Keep your appointment with the dermatologist for 04/03/21. Put neosporin on the spot twice daily, and keep it covered, to protect it.     ED Prescriptions   None    PDMP not reviewed this encounter.   Barrett Henle, MD 03/20/21 [phone]

## 2021-03-20 NOTE — ED Triage Notes (Signed)
Patient c/o skin growth x 2 weeks ago.   Patient endorses symptoms continue to worsen.   Patient has growth present on LFT hand.   Patient endorses pain.   Patient hasn't used any medications for symptoms.

## 2021-03-20 NOTE — Discharge Instructions (Addendum)
Keep your appointment with the dermatologist for 04/03/21. Put neosporin on the spot twice daily, and keep it covered, to protect it.

## 2021-04-03 ENCOUNTER — Telehealth: Payer: Self-pay | Admitting: Primary Care

## 2021-04-03 DIAGNOSIS — C44319 Basal cell carcinoma of skin of other parts of face: Secondary | ICD-10-CM | POA: Diagnosis not present

## 2021-04-03 DIAGNOSIS — C44629 Squamous cell carcinoma of skin of left upper limb, including shoulder: Secondary | ICD-10-CM | POA: Diagnosis not present

## 2021-04-03 NOTE — Telephone Encounter (Signed)
Patient is overdue for general follow up with me and needs to be scheduled ASAP.  Also received papers from [facility] requesting we send refills to their pharmacy. Did he initiate this?  We will need to see him soon, thanks.

## 2021-04-04 ENCOUNTER — Telehealth: Payer: Self-pay

## 2021-04-04 NOTE — Chronic Care Management (AMB) (Signed)
Chronic Care Management Pharmacy Assistant   Name: Kenneth [name]  MRN: [id number] DOB: 10/06/1955  Kenneth [name] is an 66 y.o. year old male who presents for his initial CCM visit with the clinical pharmacist.  Reason for Encounter: Initial Questions   Conditions to be addressed/monitored: HTN, HLD, and DMII   Recent office visits:  12/07/20-PCP-Kenneth Clark,NP-Patient presented for follow up diabetes. New A1c 6.2-no medication changes  Recent consult visits:  03/20/21-Kenneth [name] presented for skin lesion.Keep appt with dermatology upcoming.Apply neosporin twice daily 11/22/20-Physical Therapy- [facility]- multiple visits for back pain   Hospital visits:  None in 6 months   Medications: Outpatient Encounter Medications as of 04/04/2021  Medication Sig   Accu-Chek FastClix Lancets MISC USE TO CHECK BLOOD SUGAR UP TO 4 TIMES A DAY   ACCU-CHEK GUIDE test strip USE TO CHECK UP TO 4 TIMES A DAY   Ascorbic Acid (VITAMIN C) 1000 MG tablet Take 1,000 mg by mouth daily.   atenolol (TENORMIN) 50 MG tablet TAKE 1 TABLET BY MOUTH EVERY DAY   atorvastatin (LIPITOR) 20 MG tablet TAKE 1 TABLET BY MOUTH EVERY DAY IN THE EVENING   blood glucose meter kit and supplies KIT Dispense based on patient and insurance preference. Use up to four times daily as directed. (FOR ICD-9 250.00, 250.01).   Cyanocobalamin (VITAMIN B-12 PO) Take 1 tablet by mouth daily.   diclofenac (VOLTAREN) 75 MG EC tablet Take 1 tablet (75 mg total) by mouth daily as needed for moderate pain.   fluticasone (FLONASE) 50 MCG/ACT nasal spray SPRAY 2 SPRAYS INTO EACH NOSTRIL EVERY DAY   GARLIC PO Take 1 tablet by mouth daily.    Ginseng (GIN-ZING PO) Take 1 capsule by mouth daily.  (Patient not taking: No sig reported)   hydrochlorothiazide (HYDRODIURIL) 25 MG tablet TAKE 1 TABLET BY MOUTH EVERY DAY   hydrOXYzine (ATARAX/VISTARIL) 25 MG tablet Take 1 tablet (25 mg total)  by mouth every 6 (six) hours.   lisinopril (ZESTRIL) 40 MG tablet TAKE 1 TABLET BY MOUTH EVERY DAY   metFORMIN (GLUCOPHAGE) 500 MG tablet Take 1 tablet (500 mg total) by mouth 2 (two) times daily with a meal. For diabetes.   methocarbamol (ROBAXIN) 500 MG tablet Take 1 tablet (500 mg total) by mouth daily as needed for muscle spasms.   Multiple Vitamins-Minerals (MULTIVITAMIN ADULT) TABS Take 1 tablet by mouth daily.   Omega-3 Fatty Acids (OMEGA 3 PO) Take 1 capsule by mouth daily.   sertraline (ZOLOFT) 50 MG tablet Take 1 tablet (50 mg total) by mouth daily. For anxiety and depression.   No facility-administered encounter medications on file as of 04/04/2021.     Lab Results  Component Value Date/Time   HGBA1C 6.2 (A) 12/07/2020 11:17 AM   HGBA1C 6.6 (H) 05/25/2020 03:09 PM   HGBA1C 6.5 (A) 05/25/2020 02:49 PM   HGBA1C 6.1 05/05/2019 01:17 PM   MICROALBUR 10.4 06/27/2016 04:29 PM     BP Readings from Last 3 Encounters:  03/20/21 (!) 176/74  12/07/20 118/68  07/12/20 (!) 151/86    Patient contacted to review initial questions prior to visit with Kenneth [name].  Have you seen any other providers since your last visit with PCP? Yes  03/20/21-Kenneth [name] Urgent care patient presented for skin lesion.   Any changes in your medications or health? No  Any side effects from any medications? Yes  Do you have an symptoms or problems not managed by your  medications? No  Any concerns about your health right now? No  Has your provider asked that you check blood pressure, blood sugar, or follow special diet at home? Yes  The patient has both BG and BP monitors and will take readings  Do you get any type of exercise on a regular basis? No formal exercise, patient stays active with working on his house from roof to basement, goes to grocery store, keeps lawn up .  Can you think of a goal you would like to reach for your health? No  Do you have any problems getting your medications? No  the  patient uses Kenneth [name].   Is there anything that you would like to discuss during the appointment? No   Spoke with patient and reminded them to have all medications, supplements and any blood glucose and blood pressure readings available for review with pharmacist, at their telephone visit on 04/09/2021 at 9:00am.   Star Rating Drugs:  Medication:  Last Fill: Day Supply Atorvastatin 59m 11/30/20  90 Lisinopril 458m12/7/22 30 Metformin 50064m/8/22  90   Care Gaps: Annual wellness visit in last year? Yes Most Recent BP reading:118/68  98-P 12/07/20  If Diabetic: Most recent A1C reading:6.2  12/07/20 Last eye exam / retinopathy screening:11/29/20 Last diabetic foot exam:12/07/20  Kenneth BickerPP notified  [name] [phone] time spent for month CPA: 40 min

## 2021-04-05 NOTE — Telephone Encounter (Signed)
Lvm to call to schedule cpe/lab

## 2021-04-05 NOTE — Telephone Encounter (Signed)
Patient is already scheuled for 06/06/21. And he doesn't want to use [facility]

## 2021-04-06 NOTE — Telephone Encounter (Signed)
Noted. Will disregard [facility].

## 2021-04-09 ENCOUNTER — Ambulatory Visit (INDEPENDENT_AMBULATORY_CARE_PROVIDER_SITE_OTHER): Payer: Medicare Other | Admitting: Pharmacist

## 2021-04-09 ENCOUNTER — Other Ambulatory Visit: Payer: Self-pay

## 2021-04-09 DIAGNOSIS — Z720 Tobacco use: Secondary | ICD-10-CM

## 2021-04-09 DIAGNOSIS — E119 Type 2 diabetes mellitus without complications: Secondary | ICD-10-CM

## 2021-04-09 DIAGNOSIS — F32A Depression, unspecified: Secondary | ICD-10-CM

## 2021-04-09 DIAGNOSIS — G4733 Obstructive sleep apnea (adult) (pediatric): Secondary | ICD-10-CM

## 2021-04-09 DIAGNOSIS — I10 Essential (primary) hypertension: Secondary | ICD-10-CM

## 2021-04-09 DIAGNOSIS — E785 Hyperlipidemia, unspecified: Secondary | ICD-10-CM

## 2021-04-09 NOTE — Progress Notes (Signed)
Chronic Care Management Pharmacy Note  04/09/2021 Name:  Kenneth [name] MRN:  [id number] DOB:  Oct 23, 1955  Summary: -Pt reports BP 137/78-158/80 at home, although he sometimes checks after physical exertion. He also drinks 4-5 cups of coffee per day. -Of note, pt is has been on atenolol for 10+ years and does not appear to have a compelling indication for a beta blocker (HF, Afib, tremors, etc) -Pt endorses and post-nasal drip/dry cough and itchy skin; He has been using "Tussin DM' frequently  Recommendations/Changes made from today's visit: -Advised pt to check BP at rest for a few weeks and keep a log; consider switching atenolol to more optimal BP med at follow up (ie, amlodipine) -Advised to switch to decaf coffee -Advised to avoid DM products d/t BP issues; use Zyrtec/Claritin/Allegra instead  Plan: -[place] will call patient in 2 weeks for BP log -Pharmacist follow up televisit scheduled for 1 month -PCP f/u 06/06/21   Subjective: Kenneth [name] is an 66 y.o. year old male who is a primary patient of Pleas Koch, NP.  The CCM team was consulted for assistance with disease management and care coordination needs.    Engaged with patient by telephone for initial visit in response to provider referral for pharmacy case management and/or care coordination services.   Consent to Services:  The patient was given the following information about Chronic Care Management services today, agreed to services, and gave verbal consent: 1. CCM service includes personalized support from designated clinical staff supervised by the primary care provider, including individualized plan of care and coordination with other care providers 2. 24/7 contact phone numbers for assistance for urgent and routine care needs. 3. Service will only be billed when office clinical staff spend 20 minutes or more in a month to coordinate care. 4. Only one practitioner may furnish and bill the service in  a calendar month. 5.The patient may stop CCM services at any time (effective at the end of the month) by phone call to the office staff. 6. The patient will be responsible for cost sharing (co-pay) of up to 20% of the service fee (after annual deductible is met). Patient agreed to services and consent obtained.  Patient Care Team: Pleas Koch, NP as PCP - General (Internal Medicine) Unk Pinto, MD as Attending Physician (Internal Medicine) Charlton Haws, [place] as Pharmacist (Pharmacist)  Recent office visits: 12/07/20-PCP-[name],NP-Patient presented for follow up diabetes. New A1c 6.2-no medication changes. F/U 6 months.  Recent consult visits: 03/20/21-[facility] presented for skin lesion.Keep appt with dermatology upcoming.Apply neosporin twice daily 11/22/20-Physical Therapy- [facility]- multiple visits for back pain     Hospital visits: None in previous 6 months   Objective:  Lab Results  Component Value Date   CREATININE 0.88 05/25/2020   BUN 17 05/25/2020   GFR 90.92 05/25/2020   GFRNONAA >60 08/05/2017   GFRAA >60 08/05/2017   NA 140 05/25/2020   K 4.4 05/25/2020   CALCIUM 9.7 05/25/2020   CO2 29 05/25/2020   GLUCOSE 79 05/25/2020    Lab Results  Component Value Date/Time   HGBA1C 6.2 (A) 12/07/2020 11:17 AM   HGBA1C 6.6 (H) 05/25/2020 03:09 PM   HGBA1C 6.5 (A) 05/25/2020 02:49 PM   HGBA1C 6.1 05/05/2019 01:17 PM   GFR 90.92 05/25/2020 03:09 PM   GFR 93.46 05/05/2019 01:17 PM   MICROALBUR 10.4 06/27/2016 04:29 PM    Last diabetic Eye exam:  Lab  Results  Component Value Date/Time   HMDIABEYEEXA No Retinopathy 11/29/2020 12:00 AM    Last diabetic Foot exam: No results found for: HMDIABFOOTEX   Lab Results  Component Value Date   CHOL 136 05/25/2020   HDL 36.00 (L) 05/25/2020   LDLCALC 71 05/25/2020   LDLDIRECT 132.0 04/15/2017   TRIG 148.0 05/25/2020   CHOLHDL 4 05/25/2020     Hepatic Function Latest Ref Rng & Units 05/25/2020 05/05/2019 04/30/2018  Total Protein 6.0 - 8.3 g/dL 7.1 6.5 6.6  Albumin 3.5 - 5.2 g/dL 4.3 3.9 4.0  AST 0 - 37 U/L 16 18 15   ALT 0 - 53 U/L 22 27 25   Alk Phosphatase 39 - 117 U/L 82 62 81  Total Bilirubin 0.2 - 1.2 mg/dL 0.4 0.4 0.4  Bilirubin, Direct 0.0 - 0.3 mg/dL - - 0.1    Lab Results  Component Value Date/Time   TSH 0.89 04/17/2018 09:15 AM    CBC Latest Ref Rng & Units 05/25/2020 11/18/2018 04/30/2018  WBC 4.0 - 10.5 K/uL 11.4(H) 10.3 14.4(H)  Hemoglobin 13.0 - 17.0 g/dL 15.4 14.6 15.2  Hematocrit 39.0 - 52.0 % 45.6 42.2 44.8  Platelets 150.0 - 400.0 K/uL 395.0 374.0 478.0(H)    No results found for: VD25OH  Clinical ASCVD: No  The 10-year ASCVD risk score (Arnett DK, et al., 2019) is: 50.8%   Values used to calculate the score:     Age: 70 years     Sex: Male     Is Non-Hispanic African American: No     Diabetic: Yes     Tobacco smoker: Yes     Systolic Blood Pressure: 025 mmHg     Is BP treated: Yes     HDL Cholesterol: 36 mg/dL     Total Cholesterol: 136 mg/dL    Depression screen [facility] 2/9 05/05/2019 04/30/2018 04/15/2017  Decreased Interest 0 0 0  Down, Depressed, Hopeless 0 0 0  PHQ - 2 Score 0 0 0  Altered sleeping 0 0 1  Tired, decreased energy 0 0 0  Change in appetite 0 0 1  Feeling bad or failure about yourself  0 0 0  Trouble concentrating 0 0 0  Moving slowly or fidgety/restless 0 0 1  Suicidal thoughts 0 0 0  PHQ-9 Score 0 0 3  Difficult doing work/chores Not difficult at all Not difficult at all Not difficult at all     Social History   Tobacco Use  Smoking Status Every Day   Packs/day: 1.00   Years: 41.00   Pack years: 41.00   Types: Cigarettes  Smokeless Tobacco Never   BP Readings from Last 3 Encounters:  03/20/21 (!) 176/74  12/07/20 118/68  07/12/20 (!) 151/86   Pulse Readings from Last 3 Encounters:  03/20/21 68  12/07/20 98  07/12/20 (!) 54   Wt Readings from Last 3  Encounters:  12/07/20 222 lb (100.7 kg)  07/11/20 225 lb (102.1 kg)  05/25/20 231 lb 8 oz (105 kg)   BMI Readings from Last 3 Encounters:  12/07/20 33.75 kg/m  07/11/20 34.21 kg/m  05/25/20 35.72 kg/m    Assessment/Interventions: Review of patient past medical history, allergies, medications, health status, including review of consultants reports, laboratory and other test data, was performed as part of comprehensive evaluation and provision of chronic care management services.   SDOH:  (Social Determinants of Health) assessments and interventions performed: Yes  SDOH Screenings   Alcohol Screen: Not on file  Depression (PHQ2-9):  Not on file  Financial Resource Strain: Not on file  Food Insecurity: Not on file  Housing: Not on file  Physical Activity: Not on file  Social Connections: Not on file  Stress: Not on file  Tobacco Use: High Risk   Smoking Tobacco Use: Every Day   Smokeless Tobacco Use: Never   Passive Exposure: Not on file  Transportation Needs: Not on file    [place]  No Known Allergies  Medications Reviewed Today     Reviewed by Charlton Haws, [facility] (Pharmacist) on 04/09/21 at 0954  Med List Status: <None>   Medication Order Taking? Sig Documenting Provider Last Dose Status Informant  Accu-Chek FastClix Lancets MISC [id number] Yes USE TO CHECK BLOOD SUGAR UP TO 4 TIMES A DAY Pleas Koch, NP Taking Active   ACCU-CHEK GUIDE test strip [id number] Yes USE TO CHECK UP TO 4 TIMES A DAY Pleas Koch, NP Taking Active   APPLE CIDER VINEGAR PO [id number] Yes Take by mouth. [provider] Taking Active   Ascorbic Acid (VITAMIN C) 1000 MG tablet [id number] Yes Take 1,000 mg by mouth daily. [provider] Taking Active Self  atenolol (TENORMIN) 50 MG tablet [id number] Yes TAKE 1 TABLET BY MOUTH EVERY DAY Pleas Koch, NP Taking Active   atorvastatin (LIPITOR) 20 MG tablet [id number] Yes TAKE 1 TABLET BY MOUTH EVERY DAY IN  THE Tempie Hoist, NP Taking Active   Biotin 10 MG CAPS [id number] Yes Take by mouth. [provider] Taking Active   blood glucose meter kit and supplies KIT [id number] Yes Dispense based on patient and insurance preference. Use up to four times daily as directed. (FOR ICD-9 250.00, 250.01). Pleas Koch, NP Taking Active   coconut oil OIL [id number] Yes Apply 1 application topically as needed. [provider] Taking Active   Cyanocobalamin (VITAMIN B-12 PO) [id number] Yes Take 1 tablet by mouth daily. [provider] Taking Active Self  diclofenac (VOLTAREN) 75 MG EC tablet [id number] Yes Take 1 tablet (75 mg total) by mouth daily as needed for moderate pain. Pleas Koch, NP Taking Active   diphenhydrAMINE (BENADRYL) 25 MG tablet [id number] Yes Take 25 mg by mouth every 6 (six) hours as needed. [provider] Taking Active   fluticasone (FLONASE) 50 MCG/ACT nasal spray [id number] Yes SPRAY 2 SPRAYS INTO EACH NOSTRIL EVERY DAY Pleas Koch, NP Taking Active   GARLIC PO [id number] Yes Take 1 tablet by mouth daily.  [provider] Taking Active Self  Ginger, Zingiber officinalis, (GINGER PO) [id number] Yes Take by mouth. [provider] Taking Active   hydrochlorothiazide (HYDRODIURIL) 25 MG tablet [id number] Yes TAKE 1 TABLET BY MOUTH EVERY DAY Pleas Koch, NP Taking Active   hydrOXYzine (ATARAX/VISTARIL) 25 MG tablet [id number] No Take 1 tablet (25 mg total) by mouth every 6 (six) hours.  Patient not taking: Reported on 04/09/2021   Pearson Forster, NP Not Taking Active   lisinopril (ZESTRIL) 40 MG tablet [id number] Yes TAKE 1 TABLET BY MOUTH EVERY DAY Lesleigh Noe, MD Taking Active   magnesium oxide (MAG-OX) 400 MG tablet [id number] Yes Take 400 mg by mouth daily. [provider] Taking Active   metFORMIN (GLUCOPHAGE) 500 MG tablet [id number] Yes Take 1 tablet (500 mg total) by mouth 2 (two) times daily with  a meal. For diabetes. Pleas Koch, NP Taking Active   methocarbamol (ROBAXIN) 500 MG tablet [id number] Yes Take 1 tablet (500 mg  total) by mouth daily as needed for muscle spasms. Pleas Koch, NP Taking Active   Multiple Vitamins-Minerals (MULTIVITAMIN ADULT) TABS [id number] Yes Take 1 tablet by mouth daily. [provider] Taking Active Self  Omega-3 Fatty Acids (OMEGA 3 PO) [id number] Yes Take 1 capsule by mouth daily. [provider] Taking Active Self  sertraline (ZOLOFT) 50 MG tablet [id number] Yes Take 1 tablet (50 mg total) by mouth daily. For anxiety and depression. Pleas Koch, NP Taking Active             Patient Active Problem List   Diagnosis Date Noted   Acute thoracic back pain 05/14/2019   Lower extremity numbness 04/17/2018   Rhinorrhea 04/08/2018   Vitamin B 12 deficiency 07/28/2017   Tobacco abuse 05/05/2017   Preventative health care 04/24/2017   Numbness and tingling 04/15/2017   OSA (obstructive sleep apnea) 02/07/2017   Epigastric abdominal tenderness without rebound tenderness 09/10/2016   GERD without esophagitis 09/10/2016   Nonspecific abnormal electrocardiogram (ECG) (EKG) 09/10/2016   Lower extremity edema 08/12/2016   Type 2 diabetes mellitus ([place]) 06/26/2016   Anxiety and depression 06/26/2016   Hypertension 06/26/2016   Hyperlipidemia 06/26/2016   Chronic neck pain 06/26/2016    Immunization History  Administered Date(s) Administered   Influenza,inj,Quad PF,6+ Mos 04/24/2017, 02/04/2018, 12/07/2020   Janssen (J&J) SARS-COV-2 Vaccination 12/01/2019   Moderna SARS-COV2 Booster Vaccination 04/11/2020   Pneumococcal Polysaccharide-23 05/27/2018   Tdap 10/08/2013   Zoster Recombinat (Shingrix) 05/15/2019    Conditions to be addressed/monitored:  Hypertension, Hyperlipidemia, Diabetes, Depression, Anxiety, and Tobacco use  Care Plan : [place]  Updates made by Charlton Haws, [place] since  04/09/2021 12:00 AM     Problem: Hypertension, Hyperlipidemia, Diabetes, Depression, Anxiety, and Tobacco use   Priority: High     Long-Range Goal: Disease mgmt   Start Date: 04/09/2021  Expected End Date: 04/09/2022  This Visit's Progress: On track  Priority: High  Note:   Current Barriers:  Unable to independently monitor therapeutic efficacy  Pharmacist Clinical Goal(s):  Patient will achieve adherence to monitoring guidelines and medication adherence to achieve therapeutic efficacy through collaboration with PharmD and provider.   Interventions: 1:1 collaboration with Pleas Koch, NP regarding development and update of comprehensive plan of care as evidenced by provider attestation and co-signature Inter-disciplinary care team collaboration (see longitudinal plan of care) Comprehensive medication review performed; medication list updated in electronic medical record  Hypertension (BP goal <130/80) -Not ideally controlled - BP is above goal at home sometimes but pt is checking BP inconsistently, sometimes after exertion; he also drinks 4-5 cups of coffee per day -Hx of OSA, could not tolerate CPAP -Current home readings: 137/78, 158/80 (checking inconsistently) -Current treatment: Atenolol 50 mg daily - Query Appropriate HCTZ 25 mg daily - Appropriate, Effective, Safe, Accessible Lisinopril 40 mg daily - Appropriate, Effective, Safe, Accessible -Denies hypotensive/hypertensive symptoms -Educated on BP goals and benefits of medications for prevention of heart attack, stroke and kidney damage; Daily salt intake goal < 2300 mg; Importance of home blood pressure monitoring; Proper BP monitoring technique (take while resting, not within 30 min of coffee or cigarettes) -Counseled to monitor BP at home daily, document, and provide log at future appointments -Pt does not appear to have a compelling indication for a beta blocker (no Afib, HF, tremor, migraines, etc), atenolol is known  to be a relatively poor BP medication and he may benefit from changing to a more effective class (ie,  CCB); pt will check BP at home for a few weeks and we will determine if atenolol should be changed at follow up -Recommended to continue current medication  Hyperlipidemia: (LDL goal < 100) -Controlled - LDL is at goal; pt endorses compliance with statin and denies issues -Current treatment: Atorvastatin 20 mg daily - Appropriate, Effective, Safe, Accessible OTC Omega-3 fish oil -Educated on Cholesterol goals; Benefits of statin for ASCVD risk reduction; -Recommended to continue current medication  Diabetes (A1c goal <7%) -Controlled - a1c is at goal; pt endorses compliance with metformin and denies issues -Current home glucose readings fasting glucose: ~100 -Current medications: Metformin 500 mg BID - Appropriate, Effective, Safe, Accessible Testing supplies -Educated on A1c and blood sugar goals; -Counseled to check feet daily and get yearly eye exams -Recommended to continue current medication  Depression/Anxiety (Goal: manage symptoms) -Controlled - per pt report; he is now taking sertraline daily, previously only took PRN -Current treatment: Sertraline 50 mg daily - Appropriate, Effective, Safe, Accessible -Educated on Benefits of medication for symptom control -Recommended to continue current medication  Chronic back/neck pain (Goal: manage pain) -Controlled - pt sees ortho regularly; he takes NSAID prn; cuts Robaxin in half when he needs it; uses maybe twice a week -Current treatment  Diclofenac 75 mg daily PRN - Appropriate, Effective, Safe, Accessibe Methocarbamol 500 mg PRN - Appropriate, Effective, Safe, Accessible -Counseled on risks of chronic NSAIDS (bleeding issues, high BP, kidney damage) -Recommended to continue current medication  Dry cough / postnasal drip (Goal: manage symptoms) -Not ideally controlled - pt reports Flonase has helped some but still endorses  sinus issues and cough; he started taking Tussin DM recently -Current treatment  Tussin DM Fluticasone nasal spray -Counseled on risks of DM and BP issues;  -considered lisinopril may be contributing to cough - will reassess after trial of antihistamine -Advised to avoid DM products and try Zyrtec/Claritin/Allegra once daily  Tobacco use (Goal quit smoking) -Not addressed today. Reassess at follow up  Health Maintenance -Vaccine gaps: Prevnar, Shingrix, Covid booster -Advised to get Shingrix at pharmacy (Medicare should pay for this in 2023); advised to get Prevnar 20 at next PCP visit in March 2023 -Current therapy:  Vitamin C 1000 mg Multivitamin Vitamin B84 Garlic +Magnesium +Biotin +Apple cider vinegar +Beet root +Ginger +Coconut extract +Benadryl PRN -Educated on Herbal supplement research is limited and benefits usually cannot be proven;Cost vs benefit of each product must be carefully weighed by individual consumer;Supplements may interfere with prescription drugs -Patient is satisfied with current therapy and denies issues -Recommended to continue current medication  Patient Goals/Self-Care Activities Patient will:  - take medications as prescribed as evidenced by patient report and record review focus on medication adherence by pill box check glucose 1-2x weekly, document, and provide at future appointments check blood pressure daily, document, and provide at future appointments engage in dietary modifications by switching to decaf coffee -Avoid DM products; try Zyrtec/Claritin/Allegra instead      Medication Assistance: None required.  Patient affirms current coverage meets needs.  Compliance/Adherence/Medication fill history: Care Gaps: None  Star-Rating Drugs: Atorvastatin - LF 11/30/20 x 90 ds  (PDC 63%) --pt reports 7 pills remaining, will refill this week Metformin - LF 12/07/20 x 90 ds (PDC 50%) --pt reports 1 week supply remaining, will refill this  week Lisinopril - LF 03/07/21 x 30 s ([place] 100%)  Patient's preferred pharmacy is:  [facility], [place] - [address] [place] [address]  Phone: [phone] Fax: [phone]  [id number] [id number] [place] [address] [id number]: [phone]: [phone] Uses pill box? Yes Pt endorses 90% compliance  - misses meds infrequently  We discussed: Current pharmacy is preferred with insurance plan and patient is satisfied with pharmacy services Patient decided to: Continue current medication management strategy  Care Plan and Follow Up Patient Decision:  Patient agrees to Care Plan and Follow-up.  Plan: Telephone follow up appointment with care management team member scheduled for:  1 month  LCharlene Brooke PharmD, BCoral Ridge Outpatient Center LLCClinical Pharmacist LJacksonPrimary Care at SBapt[facility] [phone]

## 2021-04-09 NOTE — Patient Instructions (Signed)
Visit Information  Phone number for Pharmacist: [phone]  Thank you for meeting with me to discuss your medications! I look forward to working with you to achieve your health care goals. Below is a summary of what we talked about during the visit:   Goals Addressed             This Visit's Progress    Track and Manage My Blood Pressure-Hypertension       Timeframe:  Long-Range Goal Priority:  High Start Date:       04/09/21                      Expected End Date:   10/07/21                    Follow Up Date Feb 2023   - check blood pressure daily - choose a place to take my blood pressure (home, clinic or office, retail store) - write blood pressure results in a log or diary    Why is this important?   You won't feel high blood pressure, but it can still hurt your blood vessels.  High blood pressure can cause heart or kidney problems. It can also cause a stroke.  Making lifestyle changes like losing a little weight or eating less salt will help.  Checking your blood pressure at home and at different times of the day can help to control blood pressure.  If the doctor prescribes medicine remember to take it the way the doctor ordered.  Call the office if you cannot afford the medicine or if there are questions about it.     Notes:         Care Plan : CCM Pharmacy Care Plan  Updates made by Charlton Haws, RPH since 04/09/2021 12:00 AM     Problem: Hypertension, Hyperlipidemia, Diabetes, Depression, Anxiety, and Tobacco use   Priority: High     Long-Range Goal: Disease mgmt   Start Date: 04/09/2021  Expected End Date: 04/09/2022  This Visit's Progress: On track  Priority: High  Note:   Current Barriers:  Unable to independently monitor therapeutic efficacy  Pharmacist Clinical Goal(s):  Patient will achieve adherence to monitoring guidelines and medication adherence to achieve therapeutic efficacy through collaboration with PharmD and provider.    Interventions: 1:1 collaboration with Pleas Koch, NP regarding development and update of comprehensive plan of care as evidenced by provider attestation and co-signature Inter-disciplinary care team collaboration (see longitudinal plan of care) Comprehensive medication review performed; medication list updated in electronic medical record  Hypertension (BP goal <130/80) -Not ideally controlled - BP is above goal at home sometimes but pt is checking BP inconsistently, sometimes after exertion; he also drinks 4-5 cups of coffee per day -Hx of OSA, could not tolerate CPAP -Current home readings: 137/78, 158/80 (checking inconsistently) -Current treatment: Atenolol 50 mg daily - Query Appropriate HCTZ 25 mg daily - Appropriate, Effective, Safe, Accessible Lisinopril 40 mg daily - Appropriate, Effective, Safe, Accessible -Denies hypotensive/hypertensive symptoms -Educated on BP goals and benefits of medications for prevention of heart attack, stroke and kidney damage; Daily salt intake goal < 2300 mg; Importance of home blood pressure monitoring; Proper BP monitoring technique (take while resting, not within 30 min of coffee or cigarettes) -Counseled to monitor BP at home daily, document, and provide log at future appointments -Pt does not appear to have a compelling indication for a beta blocker (no Afib, HF, tremor, migraines, etc),  atenolol is known to be a relatively poor BP medication and he may benefit from changing to a more effective class (ie, CCB); pt will check BP at home for a few weeks and we will determine if atenolol should be changed at follow up -Recommended to continue current medication  Hyperlipidemia: (LDL goal < 100) -Controlled - LDL is at goal; pt endorses compliance with statin and denies issues -Current treatment: Atorvastatin 20 mg daily - Appropriate, Effective, Safe, Accessible OTC Omega-3 fish oil -Educated on Cholesterol goals; Benefits of statin for  ASCVD risk reduction; -Recommended to continue current medication  Diabetes (A1c goal <7%) -Controlled - a1c is at goal; pt endorses compliance with metformin and denies issues -Current home glucose readings fasting glucose: ~100 -Current medications: Metformin 500 mg BID - Appropriate, Effective, Safe, Accessible Testing supplies -Educated on A1c and blood sugar goals; -Counseled to check feet daily and get yearly eye exams -Recommended to continue current medication  Depression/Anxiety (Goal: manage symptoms) -Controlled - per pt report; he is now taking sertraline daily, previously only took PRN -Current treatment: Sertraline 50 mg daily - Appropriate, Effective, Safe, Accessible -Educated on Benefits of medication for symptom control -Recommended to continue current medication  Chronic back/neck pain (Goal: manage pain) -Controlled - pt sees ortho regularly; he takes NSAID prn; cuts Robaxin in half when he needs it; uses maybe twice a week -Current treatment  Diclofenac 75 mg daily PRN - Appropriate, Effective, Safe, Accessibe Methocarbamol 500 mg PRN - Appropriate, Effective, Safe, Accessible -Counseled on risks of chronic NSAIDS (bleeding issues, high BP, kidney damage) -Recommended to continue current medication  Dry cough / postnasal drip (Goal: manage symptoms) -Not ideally controlled - pt reports Flonase has helped some but still endorses sinus issues and cough; he started taking Tussin DM recently -Current treatment  Tussin DM Fluticasone nasal spray -Counseled on risks of DM and BP issues;  -considered lisinopril may be contributing to cough - will reassess after trial of antihistamine -Advised to avoid DM products and try Zyrtec/Claritin/Allegra once daily  Tobacco use (Goal quit smoking) -Not addressed today. Reassess at follow up  Health Maintenance -Vaccine gaps: Prevnar, Shingrix, Covid booster -Advised to get Shingrix at pharmacy (Medicare should pay for  this in 2023); advised to get Prevnar 20 at next PCP visit in March 2023 -Current therapy:  Vitamin C 1000 mg Multivitamin Vitamin G38 Garlic +Magnesium +Biotin +Apple cider vinegar +Beet root +Ginger +Coconut extract +Benadryl PRN -Educated on Herbal supplement research is limited and benefits usually cannot be proven;Cost vs benefit of each product must be carefully weighed by individual consumer;Supplements may interfere with prescription drugs -Patient is satisfied with current therapy and denies issues -Recommended to continue current medication  Patient Goals/Self-Care Activities Patient will:  - take medications as prescribed as evidenced by patient report and record review focus on medication adherence by pill box check glucose 1-2x weekly, document, and provide at future appointments check blood pressure daily, document, and provide at future appointments engage in dietary modifications by switching to decaf coffee -Avoid DM products; try Zyrtec/Claritin/Allegra instead      Mr. Klemens was given information about Chronic Care Management services today including:  CCM service includes personalized support from designated clinical staff supervised by his physician, including individualized plan of care and coordination with other care providers 24/7 contact phone numbers for assistance for urgent and routine care needs. Standard insurance, coinsurance, copays and deductibles apply for chronic care management only during months in which we provide at least  20 minutes of these services. Most insurances cover these services at 100%, however patients may be responsible for any copay, coinsurance and/or deductible if applicable. This service may help you avoid the need for more expensive face-to-face services. Only one practitioner may furnish and bill the service in a calendar month. The patient may stop CCM services at any time (effective at the end of the month) by phone call to  the office staff.  Patient agreed to services and verbal consent obtained.   The patient verbalized understanding of instructions, educational materials, and care plan provided today and declined offer to receive copy of patient instructions, educational materials, and care plan.  Telephone follow up appointment with pharmacy team member scheduled for: 1 month  [name], PharmD, [facility] Clinical Pharmacist [facility] [phone]

## 2021-04-13 ENCOUNTER — Other Ambulatory Visit: Payer: Self-pay | Admitting: Primary Care

## 2021-04-13 DIAGNOSIS — G8929 Other chronic pain: Secondary | ICD-10-CM

## 2021-04-20 ENCOUNTER — Other Ambulatory Visit: Payer: Self-pay | Admitting: Primary Care

## 2021-04-20 DIAGNOSIS — G8929 Other chronic pain: Secondary | ICD-10-CM

## 2021-04-24 ENCOUNTER — Telehealth: Payer: Self-pay

## 2021-04-24 DIAGNOSIS — Z85828 Personal history of other malignant neoplasm of skin: Secondary | ICD-10-CM | POA: Diagnosis not present

## 2021-04-24 DIAGNOSIS — L570 Actinic keratosis: Secondary | ICD-10-CM | POA: Diagnosis not present

## 2021-04-24 DIAGNOSIS — Z08 Encounter for follow-up examination after completed treatment for malignant neoplasm: Secondary | ICD-10-CM | POA: Diagnosis not present

## 2021-04-24 DIAGNOSIS — X32XXXD Exposure to sunlight, subsequent encounter: Secondary | ICD-10-CM | POA: Diagnosis not present

## 2021-04-24 NOTE — Progress Notes (Signed)
Spoke with the patient for BP log.  Since 04/10/21- ranging  137/90,133/77,136/89,132/87                                        135/75,136/79,146/82     153/85[name] monitor that showed weight also, and patient states the scale was off 20 lbs so he believes cuff not calibrated correctly.  [name] CPP notified  [name], [place] Assistant [phone]  Total time spent for month CPA: 20 min

## 2021-05-01 ENCOUNTER — Other Ambulatory Visit: Payer: Self-pay | Admitting: Primary Care

## 2021-05-01 DIAGNOSIS — F419 Anxiety disorder, unspecified: Secondary | ICD-10-CM | POA: Diagnosis not present

## 2021-05-01 DIAGNOSIS — I10 Essential (primary) hypertension: Secondary | ICD-10-CM | POA: Diagnosis not present

## 2021-05-01 DIAGNOSIS — E785 Hyperlipidemia, unspecified: Secondary | ICD-10-CM

## 2021-05-01 DIAGNOSIS — E119 Type 2 diabetes mellitus without complications: Secondary | ICD-10-CM

## 2021-05-01 DIAGNOSIS — F32A Depression, unspecified: Secondary | ICD-10-CM | POA: Diagnosis not present

## 2021-05-07 ENCOUNTER — Telehealth: Payer: Self-pay

## 2021-05-07 NOTE — Chronic Care Management (AMB) (Signed)
° ° °  Chronic Care Management Pharmacy Assistant   Name: Kenneth [name]  MRN: [id number] DOB: 22-Apr-1955   Reason for Encounter: Reminder Call   Conditions to be addressed/monitored: HTN, HLD, and DMII   Medications: Outpatient Encounter Medications as of 05/07/2021  Medication Sig   Accu-Chek FastClix Lancets MISC USE TO CHECK BLOOD SUGAR UP TO 4 TIMES A DAY   ACCU-CHEK GUIDE test strip USE TO CHECK UP TO 4 TIMES A DAY   APPLE CIDER VINEGAR PO Take by mouth.   Ascorbic Acid (VITAMIN C) 1000 MG tablet Take 1,000 mg by mouth daily.   atenolol (TENORMIN) 50 MG tablet TAKE 1 TABLET BY MOUTH EVERY DAY   atorvastatin (LIPITOR) 20 MG tablet Take 1 tablet (20 mg total) by mouth daily. For cholesterol.   Biotin 10 MG CAPS Take by mouth.   blood glucose meter kit and supplies KIT Dispense based on patient and insurance preference. Use up to four times daily as directed. (FOR ICD-9 250.00, 250.01).   coconut oil OIL Apply 1 application topically as needed.   Cyanocobalamin (VITAMIN B-12 PO) Take 1 tablet by mouth daily.   diclofenac (VOLTAREN) 75 MG EC tablet TAKE ONE TABLET EACH DAY AS NEEDED FOR MODERATE PAIN   diphenhydrAMINE (BENADRYL) 25 MG tablet Take 25 mg by mouth every 6 (six) hours as needed.   fluticasone (FLONASE) 50 MCG/ACT nasal spray SPRAY 2 SPRAYS INTO EACH NOSTRIL EVERY DAY   GARLIC PO Take 1 tablet by mouth daily.    Ginger, Zingiber officinalis, (GINGER PO) Take by mouth.   hydrochlorothiazide (HYDRODIURIL) 25 MG tablet TAKE 1 TABLET BY MOUTH EVERY DAY   lisinopril (ZESTRIL) 40 MG tablet TAKE 1 TABLET BY MOUTH EVERY DAY   magnesium oxide (MAG-OX) 400 MG tablet Take 400 mg by mouth daily.   metFORMIN (GLUCOPHAGE) 500 MG tablet Take 1 tablet (500 mg total) by mouth 2 (two) times daily with a meal. For diabetes.   methocarbamol (ROBAXIN) 500 MG tablet TAKE ONE TABLET EACH DAY AS NEEDED FOR MUSCLE SPASM   Multiple Vitamins-Minerals (MULTIVITAMIN ADULT) TABS Take 1 tablet by mouth  daily.   Omega-3 Fatty Acids (OMEGA 3 PO) Take 1 capsule by mouth daily.   sertraline (ZOLOFT) 50 MG tablet Take 1 tablet (50 mg total) by mouth daily. For anxiety and depression.   No facility-administered encounter medications on file as of 05/07/2021.   Kenneth [name] did not answer the telehone to remind of upcoming telephone visit with [name] on 05/10/21 at 3:30pm. Patient was reminded to have all medications, supplements and any blood glucose and blood pressure readings available for review at appointment. If unable to reach, a voicemail was left for patient.      Star Rating Drugs: Medication:  Last Fill: Day Supply Atorvastatin 59m 11/30/20  90 Lisinopril 493m1/20/23 30 Metformin 50043m/13/23 90 [place] notified  VelAvel SensorCMDeltanasistant [phone] time spent for month CPA: 10 min

## 2021-05-10 ENCOUNTER — Other Ambulatory Visit: Payer: Self-pay

## 2021-05-10 ENCOUNTER — Ambulatory Visit (INDEPENDENT_AMBULATORY_CARE_PROVIDER_SITE_OTHER): Payer: Medicare Other | Admitting: Pharmacist

## 2021-05-10 DIAGNOSIS — F32A Depression, unspecified: Secondary | ICD-10-CM

## 2021-05-10 DIAGNOSIS — I10 Essential (primary) hypertension: Secondary | ICD-10-CM

## 2021-05-10 DIAGNOSIS — E119 Type 2 diabetes mellitus without complications: Secondary | ICD-10-CM

## 2021-05-10 DIAGNOSIS — Z720 Tobacco use: Secondary | ICD-10-CM

## 2021-05-10 DIAGNOSIS — E785 Hyperlipidemia, unspecified: Secondary | ICD-10-CM

## 2021-05-10 NOTE — Patient Instructions (Signed)
Visit Information  Phone number for Pharmacist: [phone]   Goals Addressed   None     Care Plan : [place]  Updates made by Charlton Haws, RPH since 05/10/2021 12:00 AM     Problem: Hypertension, Hyperlipidemia, Diabetes, Depression, Anxiety, and Tobacco use   Priority: High     Long-Range Goal: Disease mgmt   Start Date: 04/09/2021  Expected End Date: 04/09/2022  This Visit's Progress: On track  Recent Progress: On track  Priority: High  Note:   Current Barriers:  Unable to independently monitor therapeutic efficacy  Pharmacist Clinical Goal(s):  Patient will achieve adherence to monitoring guidelines and medication adherence to achieve therapeutic efficacy through collaboration with PharmD and provider.   Interventions: 1:1 collaboration with Pleas Koch, NP regarding development and update of comprehensive plan of care as evidenced by provider attestation and co-signature Inter-disciplinary care team collaboration (see longitudinal plan of care) Comprehensive medication review performed; medication list updated in electronic medical record  Hypertension (BP goal <140/90) -Not ideally controlled - BP is above goal at home sometimes but pt is checking BP inconsistently, sometimes after exertion; he also drinks 4-5 cups of coffee per day - he tried to switch to decaf but could not get going in the morning with decaf coffee -Hx of OSA, could not tolerate CPAP -Current home readings: 137/87 -Current treatment: Atenolol 50 mg daily - Query Appropriate HCTZ 25 mg daily - Appropriate, Effective, Safe, Accessible Lisinopril 40 mg daily - Appropriate, Effective, Safe, Accessible -Denies hypotensive/hypertensive symptoms -Educated on BP goals and benefits of medications for prevention of heart attack, stroke and kidney damage; Daily salt intake goal < 2300 mg; Importance of home blood pressure monitoring; Proper BP monitoring technique (take while  resting, not within 30 min of coffee or cigarettes) -Counseled to monitor BP at home daily, document, and provide log at future appointments -Pt does not appear to have a compelling indication for a beta blocker (no Afib, HF, tremor, migraines, etc), atenolol is known to be a relatively poor BP medication and he may benefit from changing to a more effective class (ie, CCB) -Recommended to continue current medication  Hyperlipidemia: (LDL goal < 100) -Controlled - LDL is at goal; pt endorses compliance with statin and denies issues -Current treatment: Atorvastatin 20 mg daily - Appropriate, Effective, Safe, Accessible OTC Omega-3 fish oil -Educated on Cholesterol goals; Benefits of statin for ASCVD risk reduction; -Recommended to continue current medication  Diabetes (A1c goal <7%) -Controlled - A1c is at goal; pt endorses compliance with metformin and denies issues -Current home glucose readings fasting glucose: 105, 111 -Current medications: Metformin 500 mg BID - Appropriate, Effective, Safe, Accessible Testing supplies -Educated on A1c and blood sugar goals; -Counseled to check feet daily and get yearly eye exams -Recommended to continue current medication  Depression/Anxiety (Goal: manage symptoms) -Controlled - per pt report; he is now taking sertraline daily, previously only took PRN -Current treatment: Sertraline 50 mg daily - Appropriate, Effective, Safe, Accessible -Educated on Benefits of medication for symptom control -Recommended to continue current medication  Chronic back/neck pain (Goal: manage pain) -Controlled - pt sees ortho regularly; he takes NSAID prn; cuts Robaxin in half when he needs it; uses maybe twice a week -Current treatment  Diclofenac 75 mg daily PRN - Appropriate, Effective, Safe, Accessibe Methocarbamol 500 mg PRN - Appropriate, Effective, Safe, Accessible -Counseled on risks of chronic NSAIDS (bleeding issues, high BP, kidney damage) -Recommended  to continue current medication  Dry  cough / postnasal drip (Goal: manage symptoms) -Not ideally controlled - pt reports Flonase has helped some but still endorses sinus issues and cough; he started taking Tussin DM recently -Current treatment  Tussin DM - Appropriate, Effective, Query Safe Fluticasone nasal spray - Appropriate, Effective, Safe, Accessible -Counseled on risks of DM and BP issues;  -considered lisinopril may be contributing to cough - will reassess after trial of antihistamine -Advised to avoid DM products and try Zyrtec/Claritin/Allegra once daily  Tobacco use (Goal quit smoking) -Smoking 1 ppd; he says he could quit if he wants but he just likes to smoke; first cigarette within 30 min of waking -Pt started smoking at 18. He has quit multiple times before, once for 12 years.  -Discussed risks of continued smoking including high BP, CAD, worsening DM, poor healing, lung disease; offered help with cessation if he desires in the future  Health Maintenance -Vaccine gaps: Prevnar, Shingrix #2, Covid booster -Advised to get Shingrix at pharmacy (Medicare should pay for this in 2023); advised to get Prevnar 20 at next PCP visit in March 2023  Patient Goals/Self-Care Activities Patient will:  - take medications as prescribed as evidenced by patient report and record review focus on medication adherence by pill box check glucose 1-2x weekly, document, and provide at future appointments check blood pressure daily, document, and provide at future appointments engage in dietary modifications by switching to decaf coffee -Avoid DM products; try Zyrtec/Claritin/Allegra instead      The patient verbalized understanding of instructions, educational materials, and care plan provided today and declined offer to receive copy of patient instructions, educational materials, and care plan.  Telephone follow up appointment with pharmacy team member scheduled for: 3 months  [name], PharmD, [facility] Clinical Pharmacist [facility] at [facility] [phone]

## 2021-05-10 NOTE — Progress Notes (Signed)
Chronic Care Management Pharmacy Note  05/10/2021 Name:  Kenneth [name] MRN:  [id number] DOB:  1955-04-23  Summary: -Pt reports BP at home 137/87 -Pt tried decaf coffee, he could not make it work; advised to use regular coffee in AM and switch to decaf for subsequent cups later in the day -Of note, pt is has been on atenolol for 10+ years and does not appear to have a compelling indication for a beta blocker (HF, Afib, tremors, etc) -Pt was previously taking cough syrup 2-3 times per day; we had discussed issues with DM and high BP; his cough has resolved so he stopped using it so much  Recommendations/Changes made from today's visit: -Can consider changing atenolol to amlodipine in future if BP out of control -Advised to get 2nd Shingrix vaccine at local pharmacy -Recommend Prevnar 20 at upcoming PCP appt  Plan: -[place] will call patient in 2 months for BP log -Pharmacist follow up televisit scheduled for 3 months -PCP f/u 06/06/21   Subjective: Kenneth [name] is an 66 y.o. year old male who is a primary patient of Pleas Koch, NP.  The CCM team was consulted for assistance with disease management and care coordination needs.    Engaged with patient by telephone for follow up visit in response to provider referral for pharmacy case management and/or care coordination services.   Consent to Services:  The patient was given information about Chronic Care Management services, agreed to services, and gave verbal consent prior to initiation of services.  Please see initial visit note for detailed documentation.   Patient Care Team: Pleas Koch, NP as PCP - General (Internal Medicine) Unk Pinto, MD as Attending Physician (Internal Medicine) Charlton Haws, [facility] as Pharmacist (Pharmacist)  Recent office visits: 12/07/20-PCP-[name],NP-Patient presented for follow up diabetes. New A1c 6.2-no medication changes. F/U 6 months.  Recent consult  visits: 03/20/21- Urgent Alex presented for skin lesion.Keep appt with dermatology upcoming.Apply neosporin twice daily 11/22/20-Physical Therapy- [facility]- multiple visits for back pain    Hospital visits: None in previous 6 months   Objective:  Lab Results  Component Value Date   CREATININE 0.88 05/25/2020   BUN 17 05/25/2020   GFR 90.92 05/25/2020   GFRNONAA >60 08/05/2017   GFRAA >60 08/05/2017   NA 140 05/25/2020   K 4.4 05/25/2020   CALCIUM 9.7 05/25/2020   CO2 29 05/25/2020   GLUCOSE 79 05/25/2020    Lab Results  Component Value Date/Time   HGBA1C 6.2 (A) 12/07/2020 11:17 AM   HGBA1C 6.6 (H) 05/25/2020 03:09 PM   HGBA1C 6.5 (A) 05/25/2020 02:49 PM   HGBA1C 6.1 05/05/2019 01:17 PM   GFR 90.92 05/25/2020 03:09 PM   GFR 93.46 05/05/2019 01:17 PM   MICROALBUR 10.4 06/27/2016 04:29 PM    Last diabetic Eye exam:  Lab Results  Component Value Date/Time   HMDIABEYEEXA No Retinopathy 11/29/2020 12:00 AM    Last diabetic Foot exam: No results found for: HMDIABFOOTEX   Lab Results  Component Value Date   CHOL 136 05/25/2020   HDL 36.00 (L) 05/25/2020   LDLCALC 71 05/25/2020   LDLDIRECT 132.0 04/15/2017   TRIG 148.0 05/25/2020   CHOLHDL 4 05/25/2020    Hepatic Function Latest Ref Rng & Units 05/25/2020 05/05/2019 04/30/2018  Total Protein 6.0 - 8.3 g/dL 7.1 6.5 6.6  Albumin 3.5 - 5.2 g/dL 4.3 3.9 4.0  AST 0 - 37 U/L 16 18 15  ALT 0 - 53 U/L 22 27 25   Alk Phosphatase 39 - 117 U/L 82 62 81  Total Bilirubin 0.2 - 1.2 mg/dL 0.4 0.4 0.4  Bilirubin, Direct 0.0 - 0.3 mg/dL - - 0.1    Lab Results  Component Value Date/Time   TSH 0.89 04/17/2018 09:15 AM    CBC Latest Ref Rng & Units 05/25/2020 11/18/2018 04/30/2018  WBC 4.0 - 10.5 K/uL 11.4(H) 10.3 14.4(H)  Hemoglobin 13.0 - 17.0 g/dL 15.4 14.6 15.2  Hematocrit 39.0 - 52.0 % 45.6 42.2 44.8  Platelets 150.0 - 400.0 K/uL 395.0 374.0 478.0(H)    No results found  for: VD25OH  Clinical ASCVD: No  The 10-year ASCVD risk score (Arnett DK, et al., 2019) is: 50.8%   Values used to calculate the score:     Age: 43 years     Sex: Male     Is Non-Hispanic African American: No     Diabetic: Yes     Tobacco smoker: Yes     Systolic Blood Pressure: 325 mmHg     Is BP treated: Yes     HDL Cholesterol: 36 mg/dL     Total Cholesterol: 136 mg/dL    Depression screen [facility] 2/9 05/05/2019 04/30/2018 04/15/2017  Decreased Interest 0 0 0  Down, Depressed, Hopeless 0 0 0  PHQ - 2 Score 0 0 0  Altered sleeping 0 0 1  Tired, decreased energy 0 0 0  Change in appetite 0 0 1  Feeling bad or failure about yourself  0 0 0  Trouble concentrating 0 0 0  Moving slowly or fidgety/restless 0 0 1  Suicidal thoughts 0 0 0  PHQ-9 Score 0 0 3  Difficult doing work/chores Not difficult at all Not difficult at all Not difficult at all     Social History   Tobacco Use  Smoking Status Every Day   Packs/day: 1.00   Years: 41.00   Pack years: 41.00   Types: Cigarettes  Smokeless Tobacco Never   BP Readings from Last 3 Encounters:  03/20/21 (!) 176/74  12/07/20 118/68  07/12/20 (!) 151/86   Pulse Readings from Last 3 Encounters:  03/20/21 68  12/07/20 98  07/12/20 (!) 54   Wt Readings from Last 3 Encounters:  12/07/20 222 lb (100.7 kg)  07/11/20 225 lb (102.1 kg)  05/25/20 231 lb 8 oz (105 kg)   BMI Readings from Last 3 Encounters:  12/07/20 33.75 kg/m  07/11/20 34.21 kg/m  05/25/20 35.72 kg/m    Assessment/Interventions: Review of patient past medical history, allergies, medications, health status, including review of consultants reports, laboratory and other test data, was performed as part of comprehensive evaluation and provision of chronic care management services.   SDOH:  (Social Determinants of Health) assessments and interventions performed: Yes  SDOH Screenings   Alcohol Screen: Not on file  Depression (PHQ2-9): Not on file  Financial  Resource Strain: Not on file  Food Insecurity: Not on file  Housing: Not on file  Physical Activity: Not on file  Social Connections: Not on file  Stress: Not on file  Tobacco Use: High Risk   Smoking Tobacco Use: Every Day   Smokeless Tobacco Use: Never   Passive Exposure: Not on file  Transportation Needs: Not on file    [place]  No Known Allergies  Medications Reviewed Today     Reviewed by Charlton Haws, [facility] (Pharmacist) on 04/09/21 at 0954  Med List Status: <None>   Medication  Order Taking? Sig Documenting Provider Last Dose Status Informant  Accu-Chek FastClix Lancets MISC [id number] Yes USE TO CHECK BLOOD SUGAR UP TO 4 TIMES A DAY Pleas Koch, NP Taking Active   ACCU-CHEK GUIDE test strip [id number] Yes USE TO CHECK UP TO 4 TIMES A DAY Pleas Koch, NP Taking Active   APPLE CIDER VINEGAR PO [id number] Yes Take by mouth. [provider] Taking Active   Ascorbic Acid (VITAMIN C) 1000 MG tablet [id number] Yes Take 1,000 mg by mouth daily. [provider] Taking Active Self  atenolol (TENORMIN) 50 MG tablet [id number] Yes TAKE 1 TABLET BY MOUTH EVERY DAY Pleas Koch, NP Taking Active   atorvastatin (LIPITOR) 20 MG tablet [id number] Yes TAKE 1 TABLET BY MOUTH EVERY DAY IN THE Tempie Hoist, NP Taking Active   Biotin 10 MG CAPS [id number] Yes Take by mouth. [provider] Taking Active   blood glucose meter kit and supplies KIT [id number] Yes Dispense based on patient and insurance preference. Use up to four times daily as directed. (FOR ICD-9 250.00, 250.01). Pleas Koch, NP Taking Active   coconut oil OIL [id number] Yes Apply 1 application topically as needed. [provider] Taking Active   Cyanocobalamin (VITAMIN B-12 PO) [id number] Yes Take 1 tablet by mouth daily. [provider] Taking Active Self  diclofenac (VOLTAREN) 75 MG EC tablet [id number] Yes Take 1 tablet (75 mg total) by mouth  daily as needed for moderate pain. Pleas Koch, NP Taking Active   diphenhydrAMINE (BENADRYL) 25 MG tablet [id number] Yes Take 25 mg by mouth every 6 (six) hours as needed. [provider] Taking Active   fluticasone (FLONASE) 50 MCG/ACT nasal spray [id number] Yes SPRAY 2 SPRAYS INTO EACH NOSTRIL EVERY DAY Pleas Koch, NP Taking Active   GARLIC PO [id number] Yes Take 1 tablet by mouth daily.  [provider] Taking Active Self  Ginger, Zingiber officinalis, (GINGER PO) [id number] Yes Take by mouth. [provider] Taking Active   hydrochlorothiazide (HYDRODIURIL) 25 MG tablet [id number] Yes TAKE 1 TABLET BY MOUTH EVERY DAY Pleas Koch, NP Taking Active   hydrOXYzine (ATARAX/VISTARIL) 25 MG tablet [id number] No Take 1 tablet (25 mg total) by mouth every 6 (six) hours.  Patient not taking: Reported on 04/09/2021   Pearson Forster, NP Not Taking Active   lisinopril (ZESTRIL) 40 MG tablet [id number] Yes TAKE 1 TABLET BY MOUTH EVERY DAY Lesleigh Noe, MD Taking Active   magnesium oxide (MAG-OX) 400 MG tablet [id number] Yes Take 400 mg by mouth daily. [provider] Taking Active   metFORMIN (GLUCOPHAGE) 500 MG tablet [id number] Yes Take 1 tablet (500 mg total) by mouth 2 (two) times daily with a meal. For diabetes. Pleas Koch, NP Taking Active   methocarbamol (ROBAXIN) 500 MG tablet [id number] Yes Take 1 tablet (500 mg total) by mouth daily as needed for muscle spasms. Pleas Koch, NP Taking Active   Multiple Vitamins-Minerals (MULTIVITAMIN ADULT) TABS [id number] Yes Take 1 tablet by mouth daily. [provider] Taking Active Self  Omega-3 Fatty Acids (OMEGA 3 PO) [id number] Yes Take 1 capsule by mouth daily. [provider] Taking Active Self  sertraline (ZOLOFT) 50 MG tablet [id number] Yes Take 1 tablet (50 mg total) by mouth daily. For anxiety and depression. Pleas Koch, NP Taking Active             Patient  Active Problem List   Diagnosis Date Noted  Acute thoracic back pain 05/14/2019   Lower extremity numbness 04/17/2018   Rhinorrhea 04/08/2018   Vitamin B 12 deficiency 07/28/2017   Tobacco abuse 05/05/2017   Preventative health care 04/24/2017   Numbness and tingling 04/15/2017   OSA (obstructive sleep apnea) 02/07/2017   Epigastric abdominal tenderness without rebound tenderness 09/10/2016   GERD without esophagitis 09/10/2016   Nonspecific abnormal electrocardiogram (ECG) (EKG) 09/10/2016   Lower extremity edema 08/12/2016   Type 2 diabetes mellitus ([place]) 06/26/2016   Anxiety and depression 06/26/2016   Hypertension 06/26/2016   Hyperlipidemia 06/26/2016   Chronic neck pain 06/26/2016    Immunization History  Administered Date(s) Administered   Influenza,inj,Quad PF,6+ Mos 04/24/2017, 02/04/2018, 12/07/2020   Janssen (J&J) SARS-COV-2 Vaccination 12/01/2019   Moderna SARS-COV2 Booster Vaccination 04/11/2020   Pneumococcal Polysaccharide-23 05/27/2018   Tdap 10/08/2013   Zoster Recombinat (Shingrix) 05/15/2019    Conditions to be addressed/monitored:  Hypertension, Hyperlipidemia, Diabetes, Depression, Anxiety, and Tobacco use  Care Plan : [place]  Updates made by Charlton Haws, [place] since 05/10/2021 12:00 AM     Problem: Hypertension, Hyperlipidemia, Diabetes, Depression, Anxiety, and Tobacco use   Priority: High     Long-Range Goal: Disease mgmt   Start Date: 04/09/2021  Expected End Date: 04/09/2022  This Visit's Progress: On track  Recent Progress: On track  Priority: High  Note:   Current Barriers:  Unable to independently monitor therapeutic efficacy  Pharmacist Clinical Goal(s):  Patient will achieve adherence to monitoring guidelines and medication adherence to achieve therapeutic efficacy through collaboration with PharmD and provider.   Interventions: 1:1 collaboration with Pleas Koch, NP regarding development and update of  comprehensive plan of care as evidenced by provider attestation and co-signature Inter-disciplinary care team collaboration (see longitudinal plan of care) Comprehensive medication review performed; medication list updated in electronic medical record  Hypertension (BP goal <140/90) -Not ideally controlled - BP is above goal at home sometimes but pt is checking BP inconsistently, sometimes after exertion; he also drinks 4-5 cups of coffee per day - he tried to switch to decaf but could not get going in the morning with decaf coffee -Hx of OSA, could not tolerate CPAP -Current home readings: 137/87 -Current treatment: Atenolol 50 mg daily - Query Appropriate HCTZ 25 mg daily - Appropriate, Effective, Safe, Accessible Lisinopril 40 mg daily - Appropriate, Effective, Safe, Accessible -Denies hypotensive/hypertensive symptoms -Educated on BP goals and benefits of medications for prevention of heart attack, stroke and kidney damage; Daily salt intake goal < 2300 mg; Importance of home blood pressure monitoring; Proper BP monitoring technique (take while resting, not within 30 min of coffee or cigarettes) -Counseled to monitor BP at home daily, document, and provide log at future appointments -Pt does not appear to have a compelling indication for a beta blocker (no Afib, HF, tremor, migraines, etc), atenolol is known to be a relatively poor BP medication and he may benefit from changing to a more effective class (ie, CCB) -Recommended to continue current medication  Hyperlipidemia: (LDL goal < 100) -Controlled - LDL is at goal; pt endorses compliance with statin and denies issues -Current treatment: Atorvastatin 20 mg daily - Appropriate, Effective, Safe, Accessible OTC Omega-3 fish oil -Educated on Cholesterol goals; Benefits of statin for ASCVD risk reduction; -Recommended to continue current medication  Diabetes (A1c goal <7%) -Controlled - A1c is at goal; pt endorses compliance with  metformin and denies issues -Current home glucose readings fasting glucose: 105, 111 -Current medications: Metformin  500 mg BID - Appropriate, Effective, Safe, Accessible Testing supplies -Educated on A1c and blood sugar goals; -Counseled to check feet daily and get yearly eye exams -Recommended to continue current medication  Depression/Anxiety (Goal: manage symptoms) -Controlled - per pt report; he is now taking sertraline daily, previously only took PRN -Current treatment: Sertraline 50 mg daily - Appropriate, Effective, Safe, Accessible -Educated on Benefits of medication for symptom control -Recommended to continue current medication  Chronic back/neck pain (Goal: manage pain) -Controlled - pt sees ortho regularly; he takes NSAID prn; cuts Robaxin in half when he needs it; uses maybe twice a week -Current treatment  Diclofenac 75 mg daily PRN - Appropriate, Effective, Safe, Accessibe Methocarbamol 500 mg PRN - Appropriate, Effective, Safe, Accessible -Counseled on risks of chronic NSAIDS (bleeding issues, high BP, kidney damage) -Recommended to continue current medication  Dry cough / postnasal drip (Goal: manage symptoms) -Not ideally controlled - pt reports Flonase has helped some but still endorses sinus issues and cough; he started taking Tussin DM recently -Current treatment  Tussin DM - Appropriate, Effective, Query Safe Fluticasone nasal spray - Appropriate, Effective, Safe, Accessible -Counseled on risks of DM and BP issues;  -considered lisinopril may be contributing to cough - will reassess after trial of antihistamine -Advised to avoid DM products and try Zyrtec/Claritin/Allegra once daily  Tobacco use (Goal quit smoking) -Smoking 1 ppd; he says he could quit if he wants but he just likes to smoke; first cigarette within 30 min of waking -Pt started smoking at 18. He has quit multiple times before, once for 12 years.  -Discussed risks of continued smoking  including high BP, CAD, worsening DM, poor healing, lung disease; offered help with cessation if he desires in the future  Health Maintenance -Vaccine gaps: Prevnar, Shingrix #2, Covid booster -Advised to get Shingrix at pharmacy (Medicare should pay for this in 2023); advised to get Prevnar 20 at next PCP visit in March 2023  Patient Goals/Self-Care Activities Patient will:  - take medications as prescribed as evidenced by patient report and record review focus on medication adherence by pill box check glucose 1-2x weekly, document, and provide at future appointments check blood pressure daily, document, and provide at future appointments engage in dietary modifications by switching to decaf coffee -Avoid DM products; try Zyrtec/Claritin/Allegra instead      Medication Assistance: None required.  Patient affirms current coverage meets needs.  Compliance/Adherence/Medication fill history: Care Gaps: None  Star-Rating Drugs: Atorvastatin 52m       05/02/21              30 PDC 53% Lisinopril 461m           04/20/21            30 PDC 96% Metformin 50038m      04/13/21            90 PDC 63%  [facility]  [facility] [id number]: [phone]: [phone]/pharmacy #[id number] [facility] [address] [id number]: [phone]: [phone] pill box? Yes Pt endorses 90% compliance  - misses meds infrequently  We discussed: Current pharmacy is preferred with insurance plan and patient is satisfied with pharmacy services Patient decided to: Continue current medication management strategy  Care Plan and Follow Up  Patient Decision:  Patient agrees to Care Plan and Follow-up.  Plan: Telephone follow up appointment with care management team member scheduled for:  3 months  [name], PharmD, BCACP Clinical Pharmacist [facility] [phone]

## 2021-05-28 ENCOUNTER — Other Ambulatory Visit: Payer: Self-pay | Admitting: Primary Care

## 2021-05-28 DIAGNOSIS — I10 Essential (primary) hypertension: Secondary | ICD-10-CM

## 2021-05-28 DIAGNOSIS — J309 Allergic rhinitis, unspecified: Secondary | ICD-10-CM

## 2021-05-28 NOTE — Telephone Encounter (Signed)
Next Appt With Family Medicine Pleas Koch, NP) 06/06/2021 at 2:20 PM

## 2021-05-29 DIAGNOSIS — I10 Essential (primary) hypertension: Secondary | ICD-10-CM

## 2021-05-29 DIAGNOSIS — F419 Anxiety disorder, unspecified: Secondary | ICD-10-CM | POA: Diagnosis not present

## 2021-05-29 DIAGNOSIS — E119 Type 2 diabetes mellitus without complications: Secondary | ICD-10-CM | POA: Diagnosis not present

## 2021-05-29 DIAGNOSIS — E785 Hyperlipidemia, unspecified: Secondary | ICD-10-CM | POA: Diagnosis not present

## 2021-05-29 DIAGNOSIS — F32A Depression, unspecified: Secondary | ICD-10-CM

## 2021-06-06 ENCOUNTER — Other Ambulatory Visit: Payer: Self-pay

## 2021-06-06 ENCOUNTER — Ambulatory Visit (INDEPENDENT_AMBULATORY_CARE_PROVIDER_SITE_OTHER): Payer: Medicare Other | Admitting: Primary Care

## 2021-06-06 ENCOUNTER — Encounter: Payer: Self-pay | Admitting: Primary Care

## 2021-06-06 VITALS — BP 138/80 | HR 87 | Temp 98.6°F | Ht 68.0 in | Wt 230.0 lb

## 2021-06-06 DIAGNOSIS — E538 Deficiency of other specified B group vitamins: Secondary | ICD-10-CM

## 2021-06-06 DIAGNOSIS — G8929 Other chronic pain: Secondary | ICD-10-CM

## 2021-06-06 DIAGNOSIS — M542 Cervicalgia: Secondary | ICD-10-CM

## 2021-06-06 DIAGNOSIS — I10 Essential (primary) hypertension: Secondary | ICD-10-CM

## 2021-06-06 DIAGNOSIS — Z125 Encounter for screening for malignant neoplasm of prostate: Secondary | ICD-10-CM

## 2021-06-06 DIAGNOSIS — Z720 Tobacco use: Secondary | ICD-10-CM | POA: Diagnosis not present

## 2021-06-06 DIAGNOSIS — G4733 Obstructive sleep apnea (adult) (pediatric): Secondary | ICD-10-CM

## 2021-06-06 DIAGNOSIS — E785 Hyperlipidemia, unspecified: Secondary | ICD-10-CM | POA: Diagnosis not present

## 2021-06-06 DIAGNOSIS — Z0000 Encounter for general adult medical examination without abnormal findings: Secondary | ICD-10-CM

## 2021-06-06 DIAGNOSIS — R600 Localized edema: Secondary | ICD-10-CM

## 2021-06-06 DIAGNOSIS — F419 Anxiety disorder, unspecified: Secondary | ICD-10-CM

## 2021-06-06 DIAGNOSIS — I872 Venous insufficiency (chronic) (peripheral): Secondary | ICD-10-CM | POA: Insufficient documentation

## 2021-06-06 DIAGNOSIS — Z122 Encounter for screening for malignant neoplasm of respiratory organs: Secondary | ICD-10-CM | POA: Diagnosis not present

## 2021-06-06 DIAGNOSIS — R200 Anesthesia of skin: Secondary | ICD-10-CM | POA: Diagnosis not present

## 2021-06-06 DIAGNOSIS — E1165 Type 2 diabetes mellitus with hyperglycemia: Secondary | ICD-10-CM

## 2021-06-06 DIAGNOSIS — F32A Depression, unspecified: Secondary | ICD-10-CM

## 2021-06-06 DIAGNOSIS — K219 Gastro-esophageal reflux disease without esophagitis: Secondary | ICD-10-CM | POA: Diagnosis not present

## 2021-06-06 NOTE — Assessment & Plan Note (Signed)
Chronic, overall stable. ? ?Continue methocarbamol 500 mg PRN, diclofenac 75 mg daily PRN.  ?

## 2021-06-06 NOTE — Assessment & Plan Note (Signed)
Not using CPAP machine as he could not tolerate. ?

## 2021-06-06 NOTE — Assessment & Plan Note (Signed)
Smoking history, off and on, longest duration of smoking was 20 years. Currently smoking 1 PPD x 4 years.  ? ?Referral placed for lung cancer screening program.  ?

## 2021-06-06 NOTE — Assessment & Plan Note (Signed)
Continue vitamin B12 1000 mcg daily. ?Repeat B12 level pending. ? ?

## 2021-06-06 NOTE — Assessment & Plan Note (Signed)
Infrequent, occurs with trigger foods. ? ?Not on treatment. ?

## 2021-06-06 NOTE — Assessment & Plan Note (Signed)
Continue atorvastatin 20 mg daily. Repeat lipid panel pending. 

## 2021-06-06 NOTE — Assessment & Plan Note (Signed)
Chronic and continued. ? ?Reviewed vascular surgery notes from 2019 who recommended conservative treatment. Encouraged elevation of feet and compression socks. ? ?Also suspect his edema is multifactorial including from his lumbar DDD and venous reflux. Following with neurosurgery.  ? ?Echocardiogram reviewed from 2018 ?

## 2021-06-06 NOTE — Assessment & Plan Note (Signed)
Continue metformin 500 mg BID. ?Repeat A1C pending. ? ?Managed on statin and ACE-I. ?Foot and eye exam UTD. ?Updated Pneumonia vaccine.  ? ?Follow up in 6 months ?

## 2021-06-06 NOTE — Assessment & Plan Note (Signed)
He completed "4 vaccines" at [facility] one month ago. Will call to verify which he received.  ? ?PSA due and pending.  ?Colonoscopy UTD, due 2029. ?Referral placed for lung cancer screening. ? ?Discussed the importance of a healthy diet and regular exercise in order for weight loss, and to reduce the risk of further co-morbidity. ? ?Exam today stable, labs pending. ?

## 2021-06-06 NOTE — Assessment & Plan Note (Addendum)
Borderline high today, overall okay. ? ?Reviewed pharmacist's notes who did not find a good indication for atenolol. Patient has been taking this for 15+ years, prior to establishing with our clinic.  ? ?Unfortunately he struggles with chronic ankle and lower extremity edema, amlodipine may further exacerbate. We will continue atenolol 50 mg, HCTZ 25 mg daily, lisinopril 40 mg daily. ? ?We will plan to see him back in 2-3 weeks for BP check.  ? ?CMP pending. ?

## 2021-06-06 NOTE — Patient Instructions (Signed)
Stop taking atenolol 50 mg for blood pressure. ? ?Start taking amlodipine 5 mg once daily for blood pressure. ?Continue all of your other medications. ? ?You will be contacted regarding your referral to the lung cancer screening program.  Please let us know if you have not been contacted within two weeks.  ? ?It was a pleasure to see you today! ? ?Preventive Care 9 Years and Older, Male ?Preventive care refers to lifestyle choices and visits with your health care provider that can promote health and wellness. Preventive care visits are also called wellness exams. ?What can I expect for my preventive care visit? ?Counseling ?During your preventive care visit, your health care provider may ask about your: ?Medical history, including: ?Past medical problems. ?Family medical history. ?History of falls. ?Current health, including: ?Emotional well-being. ?Home life and relationship well-being. ?Sexual activity. ?Memory and ability to understand (cognition). ?Lifestyle, including: ?Alcohol, nicotine or tobacco, and drug use. ?Access to firearms. ?Diet, exercise, and sleep habits. ?Work and work Statistician. ?Sunscreen use. ?Safety issues such as seatbelt and bike helmet use. ?Physical exam ?Your health care provider will check your: ?Height and weight. These may be used to calculate your BMI (body mass index). BMI is a measurement that tells if you are at a healthy weight. ?Waist circumference. This measures the distance around your waistline. This measurement also tells if you are at a healthy weight and may help predict your risk of certain diseases, such as type 2 diabetes and high blood pressure. ?Heart rate and blood pressure. ?Body temperature. ?Skin for abnormal spots. ?What immunizations do I need? ?Vaccines are usually given at various ages, according to a schedule. Your health care provider will recommend vaccines for you based on your age, medical history, and lifestyle or other factors, such as travel or where  you work. ?What tests do I need? ?Screening ?Your health care provider may recommend screening tests for certain conditions. This may include: ?Lipid and cholesterol levels. ?Diabetes screening. This is done by checking your blood sugar (glucose) after you have not eaten for a while (fasting). ?Hepatitis C test. ?Hepatitis B test. ?HIV (human immunodeficiency virus) test. ?STI (sexually transmitted infection) testing, if you are at risk. ?Lung cancer screening. ?Colorectal cancer screening. ?Prostate cancer screening. ?Abdominal aortic aneurysm (AAA) screening. You may need this if you are a current or former smoker. ?Talk with your health care provider about your test results, treatment options, and if necessary, the need for more tests. ?Follow these instructions at home: ?Eating and drinking ? ?Eat a diet that includes fresh fruits and vegetables, whole grains, lean protein, and low-fat dairy products. Limit your intake of foods with high amounts of sugar, saturated fats, and salt. ?Take vitamin and mineral supplements as recommended by your health care provider. ?Do not drink alcohol if your health care provider tells you not to drink. ?If you drink alcohol: ?Limit how much you have to 0-2 drinks a day. ?Know how much alcohol is in your drink. In the [place] one drink equals one 12 oz bottle of beer (355 mL), one 5 oz glass of wine (148 mL), or one 1? oz glass of hard liquor (44 mL). ?Lifestyle ?Brush your teeth every morning and night with fluoride toothpaste. Floss one time each day. ?Exercise for at least 30 minutes 5 or more days each week. ?Do not use any products that contain nicotine or tobacco. These products include cigarettes, chewing tobacco, and vaping devices, such as e-cigarettes. If you need help quitting, ask your  health care provider. ?Do not use drugs. ?If you are sexually active, practice safe sex. Use a condom or other form of protection to prevent STIs. ?Take aspirin only as told by your  health care provider. Make sure that you understand how much to take and what form to take. Work with your health care provider to find out whether it is safe and beneficial for you to take aspirin daily. ?Ask your health care provider if you need to take a cholesterol-lowering medicine (statin). ?Find healthy ways to manage stress, such as: ?Meditation, yoga, or listening to music. ?Journaling. ?Talking to a trusted person. ?Spending time with friends and family. ?Safety ?Always wear your seat belt while driving or riding in a vehicle. ?Do not drive: ?If you have been drinking alcohol. Do not ride with someone who has been drinking. ?When you are tired or distracted. ?While texting. ?If you have been using any mind-altering substances or drugs. ?Wear a helmet and other protective equipment during sports activities. ?If you have firearms in your house, make sure you follow all gun safety procedures. ?Minimize exposure to UV radiation to reduce your risk of skin cancer. ?What's next? ?Visit your health care provider once a year for an annual wellness visit. ?Ask your health care provider how often you should have your eyes and teeth checked. ?Stay up to date on all vaccines. ?This information is not intended to replace advice given to you by your health care provider. Make sure you discuss any questions you have with your health care provider. ?Document Revised: 09/13/2020 Document Reviewed: 09/13/2020 ?Elsevier Patient Education ? [place]. ? ?

## 2021-06-06 NOTE — Assessment & Plan Note (Signed)
Evaluated by neurosurgery and vascular services, symptoms secondary to lumbar spine.  ? ?He is contemplating surgery. ?

## 2021-06-06 NOTE — Assessment & Plan Note (Signed)
Controlled.  Continue sertraline 50 mg daily. 

## 2021-06-06 NOTE — Assessment & Plan Note (Signed)
Chronic.  ? ?Reviewed notes from vascular surgery in 2019 who recommended conservative treatment with compression stockings. ? ?Encouraged elevation of legs when resting. ?He will notify if his swelling becomes worse/bothersome. ?

## 2021-06-06 NOTE — Progress Notes (Signed)
? ?Subjective:  ? ? Patient ID: Kenneth [name], male    DOB: 01-07-1956, 66 y.o.   MRN: [id number] ? ?HPI ? ?Kenneth [name] is a very pleasant 66 y.o. male who presents today for complete physical and follow up of chronic conditions. ? ?Immunizations: ?-Tetanus: 2015 ?-Influenza: Completed the season ?-Covid-19: 2 vaccines total ?-Shingles: Completed 1 dose of Shingrix ?-Pneumonia: Pneumovax 23 in 2020 ? ?He completed "4 vaccines" at [facility] one month ago. Will call to verify which vaccines he received.  ? ?Diet: Fair diet.  ?Exercise: No regular exercise. ? ?Eye exam: Completes annually  ? ? ?Colonoscopy: Completed in 2019, due 2029 ?PSA: Due ? ?Lung Cancer Screening: Never Completed. Smoking history, off and on, longest duration of smoking was 20 years. Currently smoking 1 PPD x 4 years.  ? ?BP Readings from Last 3 Encounters:  ?06/06/21 138/80  ?03/20/21 (!) 176/74  ?12/07/20 118/68  ? ? ? ? ? ?Review of Systems  ?Constitutional:  Negative for unexpected weight change.  ?HENT:  Negative for rhinorrhea.   ?Respiratory:  Negative for shortness of breath.   ?Cardiovascular:  Negative for chest pain.  ?Gastrointestinal:  Negative for constipation and diarrhea.  ?Genitourinary:  Negative for difficulty urinating.  ?Musculoskeletal:  Positive for arthralgias. Negative for myalgias.  ?Skin:  Negative for rash.  ?Allergic/Immunologic: Negative for environmental allergies.  ?Neurological:  Negative for dizziness and headaches.  ?Psychiatric/Behavioral:  The patient is not nervous/anxious.   ? ?   ? ? ?Past Medical History:  ?Diagnosis Date  ? Anxiety and depression   ? Bipolar 1 disorder ([place])   ? Blood transfusion without reported diagnosis   ? Chronic kidney disease   ? Depression   ? GERD (gastroesophageal reflux disease)   ? Hypercholesterolemia   ? Hypertension   ? Sleep apnea   ? Type 2 diabetes mellitus ([place])   ? ? ?Social History  ? ?Socioeconomic History  ? Marital status: Divorced  ?  Spouse name:  Not on file  ? Number of children: Not on file  ? Years of education: Not on file  ? Highest education level: Not on file  ?Occupational History  ? Not on file  ?Tobacco Use  ? Smoking status: Every Day  ?  Packs/day: 1.00  ?  Years: 41.00  ?  Pack years: 41.00  ?  Types: Cigarettes  ? Smokeless tobacco: Never  ?Vaping Use  ? Vaping Use: Never used  ?Substance and Sexual Activity  ? Alcohol use: No  ? Drug use: No  ? Sexual activity: Not on file  ?Other Topics Concern  ? Not on file  ?Social History Narrative  ? Single.  ? 1 daughter, 1 grandchild.  ? Retired. Once worked in [employer].  ? Enjoys sports, racing, traveling.   ? ?Social Determinants of Health  ? ?Financial Resource Strain: Not on file  ?Food Insecurity: Not on file  ?Transportation Needs: Not on file  ?Physical Activity: Not on file  ?Stress: Not on file  ?Social Connections: Not on file  ?Intimate Partner Violence: Not on file  ? ? ?Past Surgical History:  ?Procedure Laterality Date  ? L hand surgery    ? R knee arthoroscopy    ? SPLENECTOMY    ? ? ?Family History  ?Problem Relation Age of Onset  ? Hypertension Mother   ? Mental illness Mother   ? Diabetes Mother   ? Hypertension Father   ? Lung cancer Father   ?  Diabetes Paternal Grandmother   ? Colon cancer Maternal Uncle   ? Esophageal cancer Neg Hx   ? Liver cancer Neg Hx   ? Pancreatic cancer Neg Hx   ? Rectal cancer Neg Hx   ? Stomach cancer Neg Hx   ? ? ?No Known Allergies ? ?Current Outpatient Medications on File Prior to Visit  ?Medication Sig Dispense Refill  ? Accu-Chek FastClix Lancets MISC USE TO CHECK BLOOD SUGAR UP TO 4 TIMES A DAY 100 each 3  ? ACCU-CHEK GUIDE test strip USE TO CHECK UP TO 4 TIMES A DAY 100 strip 3  ? APPLE CIDER VINEGAR PO Take by mouth.    ? Ascorbic Acid (VITAMIN C) 1000 MG tablet Take 1,000 mg by mouth daily.    ? atorvastatin (LIPITOR) 20 MG tablet Take 1 tablet (20 mg total) by mouth daily. For cholesterol. 30 tablet 0  ? Biotin 10 MG CAPS Take by mouth.    ? blood  glucose meter kit and supplies KIT Dispense based on patient and insurance preference. Use up to four times daily as directed. (FOR ICD-9 250.00, 250.01). 1 each 0  ? coconut oil OIL Apply 1 application topically as needed.    ? Cyanocobalamin (VITAMIN B-12 PO) Take 1 tablet by mouth daily.    ? diclofenac (VOLTAREN) 75 MG EC tablet TAKE ONE TABLET EACH DAY AS NEEDED FOR MODERATE PAIN 90 tablet 0  ? diphenhydrAMINE (BENADRYL) 25 MG tablet Take 25 mg by mouth every 6 (six) hours as needed.    ? fluticasone (FLONASE) 50 MCG/ACT nasal spray Place 1 spray into both nostrils 2 (two) times daily as needed for allergies or rhinitis. 16 g 0  ? GARLIC PO Take 1 tablet by mouth daily.     ? Ginger, Zingiber officinalis, (GINGER PO) Take by mouth.    ? hydrochlorothiazide (HYDRODIURIL) 25 MG tablet TAKE 1 TABLET BY MOUTH EVERY DAY 90 tablet 1  ? lisinopril (ZESTRIL) 40 MG tablet TAKE 1 TABLET BY MOUTH EVERY DAY 90 tablet 0  ? magnesium oxide (MAG-OX) 400 MG tablet Take 400 mg by mouth daily.    ? metFORMIN (GLUCOPHAGE) 500 MG tablet Take 1 tablet (500 mg total) by mouth 2 (two) times daily with a meal. For diabetes. 180 tablet 3  ? methocarbamol (ROBAXIN) 500 MG tablet TAKE ONE TABLET EACH DAY AS NEEDED FOR MUSCLE SPASM 30 tablet 0  ? Multiple Vitamins-Minerals (MULTIVITAMIN ADULT) TABS Take 1 tablet by mouth daily.    ? Omega-3 Fatty Acids (OMEGA 3 PO) Take 1 capsule by mouth daily.    ? sertraline (ZOLOFT) 50 MG tablet Take 1 tablet (50 mg total) by mouth daily. For anxiety and depression. 90 tablet 1  ? ?No current facility-administered medications on file prior to visit.  ? ? ?BP 138/80   Pulse 87   Temp 98.6 ?F (37 ?C) (Oral)   Ht 5' 8"  (1.727 m)   Wt 230 lb (104.3 kg)   SpO2 96%   BMI 34.97 kg/m?  ?Objective:  ? Physical Exam ?HENT:  ?   Right Ear: Tympanic membrane and ear canal normal.  ?   Left Ear: Tympanic membrane and ear canal normal.  ?   Nose: Nose normal.  ?   Right Sinus: No maxillary sinus tenderness or  frontal sinus tenderness.  ?   Left Sinus: No maxillary sinus tenderness or frontal sinus tenderness.  ?Eyes:  ?   Conjunctiva/sclera: Conjunctivae normal.  ?Neck:  ?  Thyroid: No thyromegaly.  ?   Vascular: No carotid bruit.  ?Cardiovascular:  ?   Rate and Rhythm: Normal rate and regular rhythm.  ?   Heart sounds: Normal heart sounds.  ?Pulmonary:  ?   Effort: Pulmonary effort is normal.  ?   Breath sounds: Normal breath sounds. No wheezing or rales.  ?Abdominal:  ?   General: Bowel sounds are normal.  ?   Palpations: Abdomen is soft.  ?   Tenderness: There is no abdominal tenderness.  ?Musculoskeletal:     ?   General: Normal range of motion.  ?   Cervical back: Neck supple.  ?Skin: ?   General: Skin is warm and dry.  ?Neurological:  ?   Mental Status: He is alert and oriented to person, place, and time.  ?   Cranial Nerves: No cranial nerve deficit.  ?   Deep Tendon Reflexes: Reflexes are normal and symmetric.  ?Psychiatric:     ?   Mood and Affect: Mood normal.  ? ? ? ? ? ?   ?Assessment & Plan:  ? ? ? ? ?This visit occurred during the SARS-CoV-2 public health emergency.  Safety protocols were in place, including screening questions prior to the visit, additional usage of staff PPE, and extensive cleaning of exam room while observing appropriate contact time as indicated for disinfecting solutions.  ?

## 2021-06-07 LAB — CBC
HCT: 42.2 % (ref 39.0–52.0)
Hemoglobin: 14.4 g/dL (ref 13.0–17.0)
MCHC: 34.2 g/dL (ref 30.0–36.0)
MCV: 100.1 fl — ABNORMAL HIGH (ref 78.0–100.0)
Platelets: 371 10*3/uL (ref 150.0–400.0)
RBC: 4.22 Mil/uL (ref 4.22–5.81)
RDW: 13.2 % (ref 11.5–15.5)
WBC: 11.6 10*3/uL — ABNORMAL HIGH (ref 4.0–10.5)

## 2021-06-07 LAB — LIPID PANEL
Cholesterol: 128 mg/dL (ref 0–200)
HDL: 34.7 mg/dL — ABNORMAL LOW (ref >39.00)
NonHDL: 93.76
Total CHOL/HDL Ratio: 4
Triglycerides: 247 mg/dL — ABNORMAL HIGH (ref 0.0–149.0)
VLDL: 49.4 mg/dL — ABNORMAL HIGH (ref 0.0–40.0)

## 2021-06-07 LAB — VITAMIN B12: Vitamin B-12: 137 pg/mL — ABNORMAL LOW (ref 211–911)

## 2021-06-07 LAB — COMPREHENSIVE METABOLIC PANEL
ALT: 33 U/L (ref 0–53)
AST: 23 U/L (ref 0–37)
Albumin: 4.1 g/dL (ref 3.5–5.2)
Alkaline Phosphatase: 71 U/L (ref 39–117)
BUN: 15 mg/dL (ref 6–23)
CO2: 30 mEq/L (ref 19–32)
Calcium: 9.3 mg/dL (ref 8.4–10.5)
Chloride: 103 mEq/L (ref 96–112)
Creatinine, Ser: 0.81 mg/dL (ref 0.40–1.50)
GFR: 92.55 mL/min (ref >60.00)
Glucose, Bld: 108 mg/dL — ABNORMAL HIGH (ref 70–99)
Potassium: 4.2 mEq/L (ref 3.5–5.1)
Sodium: 141 mEq/L (ref 135–145)
Total Bilirubin: 0.4 mg/dL (ref 0.2–1.2)
Total Protein: 6.5 g/dL (ref 6.0–8.3)

## 2021-06-07 LAB — LDL CHOLESTEROL, DIRECT: Direct LDL: 78 mg/dL

## 2021-06-07 LAB — PSA, MEDICARE: PSA: 1.23 ng/ml (ref 0.10–4.00)

## 2021-06-07 LAB — HEMOGLOBIN A1C: Hgb A1c MFr Bld: 6.3 % (ref 4.6–6.5)

## 2021-06-09 ENCOUNTER — Other Ambulatory Visit: Payer: Self-pay | Admitting: Primary Care

## 2021-06-09 DIAGNOSIS — E785 Hyperlipidemia, unspecified: Secondary | ICD-10-CM

## 2021-06-12 ENCOUNTER — Telehealth: Payer: Self-pay | Admitting: Primary Care

## 2021-06-12 NOTE — Telephone Encounter (Signed)
Pt returning your call

## 2021-06-13 ENCOUNTER — Telehealth: Payer: Self-pay

## 2021-06-13 NOTE — Telephone Encounter (Signed)
-----   Message from Pleas Koch, NP sent at 06/07/2021  5:17 PM EST ----- ?Please call patient: ? ?Kidneys, liver, electrolytes, blood counts, prostate, cholesterol, diabetes look good. ? ?Vitamin B12 level is too low.  Is he taking any vitamin B12 vitamins?  If so, what is the dose? ?If not then have him start vitamin B12 1000 mcg injections.   ? ?He will need 1 injection once weekly x 1 month, then 1 injection every 2 weeks x 1 month, then once monthly x 1 month only.  This is a total of 3 months of treatment. ? ?He will also need a vitamin B12 lab appointment in 4 months. This will be 1 month after he finishes his last dose. ? ?He also needs a diabetes follow-up scheduled for 26-month. ?

## 2021-06-13 NOTE — Telephone Encounter (Signed)
Called pharmacy will send immunizations. See open message for further documentation.  ?

## 2021-06-13 NOTE — Telephone Encounter (Signed)
Spoke with patient regarding his vit b12 meds he is going call us back to confirm the the dose of his medication , regarding his  dose  ?

## 2021-06-19 DIAGNOSIS — E119 Type 2 diabetes mellitus without complications: Secondary | ICD-10-CM | POA: Diagnosis not present

## 2021-06-19 LAB — HM DIABETES EYE EXAM

## 2021-07-09 ENCOUNTER — Other Ambulatory Visit: Payer: Self-pay | Admitting: Primary Care

## 2021-07-09 DIAGNOSIS — G8929 Other chronic pain: Secondary | ICD-10-CM

## 2021-07-10 ENCOUNTER — Telehealth: Payer: Self-pay

## 2021-07-10 NOTE — Chronic Care Management (AMB) (Signed)
? ? ?Chronic Care Management ?Pharmacy Assistant  ? ?Name: Kenneth [name]  MRN: [id number] DOB: [birth year] ? ? ?Reason for Encounter Hypertension  Disease State ? ?Recent office visits:  ?06/06/21-PCP-[name],NP- [facility]-discussed screenings and vaccines.Labs ordered(Kidneys, liver, electrolytes, blood counts, prostate, cholesterol, diabetes look good-Vitamin B12 level is too low).-Continue atenolol 35m 1 daily ? ?Recent consult visits:  ?None since last CCM contact ? ?Hospital visits:  ?None in previous 6 months ? ?Medications: ?Outpatient Encounter Medications as of 07/10/2021  ?Medication Sig  ? Accu-Chek FastClix Lancets MISC USE TO CHECK BLOOD SUGAR UP TO 4 TIMES A DAY  ? ACCU-CHEK GUIDE test strip USE TO CHECK UP TO 4 TIMES A DAY  ? APPLE CIDER VINEGAR PO Take by mouth.  ? Ascorbic Acid (VITAMIN C) 1000 MG tablet Take 1,000 mg by mouth daily.  ? atorvastatin (LIPITOR) 20 MG tablet Take 1 tablet (20 mg total) by mouth daily. for cholesterol.  ? Biotin 10 MG CAPS Take by mouth.  ? blood glucose meter kit and supplies KIT Dispense based on patient and insurance preference. Use up to four times daily as directed. (FOR ICD-9 250.00, 250.01).  ? coconut oil OIL Apply 1 application topically as needed.  ? Cyanocobalamin (VITAMIN B-12 PO) Take 1 tablet by mouth daily.  ? diclofenac (VOLTAREN) 75 MG EC tablet TAKE ONE TABLET EACH DAY AS NEEDED FOR MODERATE PAIN  ? diphenhydrAMINE (BENADRYL) 25 MG tablet Take 25 mg by mouth every 6 (six) hours as needed.  ? fluticasone (FLONASE) 50 MCG/ACT nasal spray Place 1 spray into both nostrils 2 (two) times daily as needed for allergies or rhinitis.  ? GARLIC PO Take 1 tablet by mouth daily.   ? Ginger, Zingiber officinalis, (GINGER PO) Take by mouth.  ? hydrochlorothiazide (HYDRODIURIL) 25 MG tablet TAKE 1 TABLET BY MOUTH EVERY DAY  ? lisinopril (ZESTRIL) 40 MG tablet TAKE 1 TABLET BY MOUTH EVERY DAY  ? magnesium oxide (MAG-OX) 400 MG tablet Take 400 mg by mouth daily.  ?  metFORMIN (GLUCOPHAGE) 500 MG tablet Take 1 tablet (500 mg total) by mouth 2 (two) times daily with a meal. For diabetes.  ? methocarbamol (ROBAXIN) 500 MG tablet TAKE ONE TABLET EACH DAY AS NEEDED FOR MUSCLE SPASM  ? Multiple Vitamins-Minerals (MULTIVITAMIN ADULT) TABS Take 1 tablet by mouth daily.  ? Omega-3 Fatty Acids (OMEGA 3 PO) Take 1 capsule by mouth daily.  ? sertraline (ZOLOFT) 50 MG tablet Take 1 tablet (50 mg total) by mouth daily. For anxiety and depression.  ? ?No facility-administered encounter medications on file as of 07/10/2021.  ? ? ?Recent Office Vitals: ?BP Readings from Last 3 Encounters:  ?06/06/21 138/80  ?03/20/21 (!) 176/74  ?12/07/20 118/68  ? ?Pulse Readings from Last 3 Encounters:  ?06/06/21 87  ?03/20/21 68  ?12/07/20 98  ?  ?Wt Readings from Last 3 Encounters:  ?06/06/21 230 lb (104.3 kg)  ?12/07/20 222 lb (100.7 kg)  ?07/11/20 225 lb (102.1 kg)  ?  ? ?Kidney Function ?Lab Results  ?Component Value Date/Time  ? CREATININE 0.81 06/06/2021 03:09 PM  ? CREATININE 0.88 05/25/2020 03:09 PM  ? GFR 92.55 06/06/2021 03:09 PM  ? GFRNONAA >60 08/05/2017 04:20 AM  ? GFRAA >60 08/05/2017 04:20 AM  ? ? ? ?  Latest Ref Rng & Units 06/06/2021  ?  3:09 PM 05/25/2020  ?  3:09 PM 05/05/2019  ?  1:17 PM  ?BMP  ?Glucose 70 - 99 mg/dL 108   79  92    ?BUN 6 - 23 mg/dL 15   17   12     ?Creatinine 0.40 - 1.50 mg/dL 0.81   0.88   0.83    ?Sodium 135 - 145 mEq/L 141   140   142    ?Potassium 3.5 - 5.1 mEq/L 4.2   4.4   3.8    ?Chloride 96 - 112 mEq/L 103   101   106    ?CO2 19 - 32 mEq/L 30   29   29     ?Calcium 8.4 - 10.5 mg/dL 9.3   9.7   8.9    ? ? ? ?Contacted patient on 07/12/21 to discuss hypertension disease state ? ?Current antihypertensive regimen:  ?Atenolol 50 mg daily ?HCTZ 25 mg daily  ?Lisinopril 40 mg daily  ? ? Patient verbally confirms he is taking the above medications as directed. Yes ? ?How often are you checking your Blood Pressure? weekly ? ?he checks his blood pressure in the middle of the day  after taking his medication. ? ?Current home BP readings:  138/80 06/06/21 office  BP ? ?  From the week of  07/02/21- 135/82, 139/77, 140/87, 138/88 ?             ?Wrist or arm cuff:  arm cuff ?Salt intake: removed from diet  ?Over the counter medications including pseudoephedrine or NSAIDs?  No ? ?Any readings above 180/120? No ? ? ?What recent interventions/DTPs have been made by any provider to improve Blood Pressure control since last CPP Visit: Proper BP monitoring technique (take while resting, not within 30 min of coffee or cigarettes)  no medication changes until swelling lower extremities addressed  ? ?Any recent hospitalizations or ED visits since last visit with CPP? No ? ?What diet changes have been made to improve Blood Pressure Control? Patient reports he cooks meals at home and uses healthy meal planning and maintains healthy weight. He has cut out adding salt to food. ?   ? ?What exercise is being done to improve your Blood Pressure Control?  ? Patient  reports he mows own grass with a push mower, maintains yard ? ?Adherence Review: ?Is the patient currently on ACE/ARB medication? Yes ?Does the patient have >5 day gap between last estimated fill dates? No ? ? ?Star Rating Drugs:  ?Medication:  Last Fill: Day Supply ?Atorvastatin 63m 06/11/21 30 ?Metformin 5076m1/13/23 90 ?Lisinopril 4021m/27/23 90 ? ? ? ?Care Gaps: ?Annual wellness visit in last year? Yes ?Most Recent BP reading:138/80  87-P  06/06/21 ? ?If Diabetic: ?Most recent A1C reading:6.3 ?Last eye exam / retinopathy screening:UTD ?Last diabetic foot exam:UTD ? ?Upcoming appointments: ?CCM appointment on 5/ 4/23 ? ? ?LinCharlene BrookePP notified ? ?Elli Groesbeck, CCMA ?Health concierge  ?[phone]

## 2021-07-17 ENCOUNTER — Encounter: Payer: Self-pay | Admitting: Primary Care

## 2021-07-18 ENCOUNTER — Ambulatory Visit (INDEPENDENT_AMBULATORY_CARE_PROVIDER_SITE_OTHER): Payer: Medicare Other | Admitting: Nurse Practitioner

## 2021-07-18 VITALS — BP 154/88 | HR 70 | Temp 96.9°F | Resp 16 | Ht 68.0 in | Wt 220.0 lb

## 2021-07-18 DIAGNOSIS — H6122 Impacted cerumen, left ear: Secondary | ICD-10-CM | POA: Diagnosis not present

## 2021-07-18 NOTE — Patient Instructions (Signed)
Nice to see you today ?Follow up with [name] as needed ? ?

## 2021-07-18 NOTE — Progress Notes (Signed)
? ? ?  Acute Office Visit ? ?Subjective:  ? ?  ?Patient ID: Kenneth [name], male    DOB: Oct 22, 1955, 66 y.o.   MRN: [id number] ? ?Chief Complaint  ?Patient presents with  ? Ear Fullness  ?  Left ear, about 2 weeks now feels stopped up/muffled.  ? ? ? ?Patient is in today for ear fullness ? ?States that he noticed it about 2 weeks ago. States that he will go to clean his ears out with toilet tissue.  When he was doing this he did notice a brief.  Of fullness that self resolved.  States this has happened in the past. States a few days later he used his finger and ear stopped up and then did not. ?Used over the counter syringe to try to flush it out, without success. States that on the way he it felt like it was stopping it up ? ?Review of Systems  ?Constitutional:  Negative for chills and fever.  ?HENT:  Positive for ear pain (ear fullness). Negative for ear discharge and hearing loss.   ?Neurological:  Negative for dizziness and headaches.  ? ? ?   ?Objective:  ?  ?BP (!) 154/88   Pulse 70   Temp (!) 96.9 ?F (36.1 ?C)   Resp 16   Ht '5\' 8"'$  (1.727 m)   Wt 220 lb (99.8 kg)   SpO2 94%   BMI 33.45 kg/m?  ? ? ?Physical Exam ?Vitals and nursing note reviewed.  ?Constitutional:   ?   Appearance: Normal appearance.  ?HENT:  ?   Right Ear: Tympanic membrane, ear canal and external ear normal. There is no impacted cerumen.  ?   Left Ear: Ear canal and external ear normal. There is impacted cerumen.  ?   Mouth/Throat:  ?   Mouth: Mucous membranes are moist.  ?   Pharynx: Oropharynx is clear.  ?Cardiovascular:  ?   Rate and Rhythm: Normal rate and regular rhythm.  ?Pulmonary:  ?   Effort: Pulmonary effort is normal.  ?   Breath sounds: Normal breath sounds.  ?Neurological:  ?   Mental Status: He is alert.  ? ? ?No results found for any visits on 07/18/21. ? ? ?   ?Assessment & Plan:  ? ?Problem List Items Addressed This Visit   ? ?  ? Nervous and Auditory  ? Impacted cerumen of left ear - Primary  ?  We will consent obtained  from patient.  Patient was prepped with cerumen softening eardrops and then per office policy mixture of water and hydroperoxide was used to irrigate impaction.  Impaction removed TMs within normal limits post irrigation ? ?  ?  ? Relevant Orders  ? Ear Lavage  ? ? ?No orders of the defined types were placed in this encounter. ? ? ?Return if symptoms worsen or fail to improve. ? ?Romilda Garret, NP ? ? ?

## 2021-07-18 NOTE — Assessment & Plan Note (Signed)
We will consent obtained from patient.  Patient was prepped with cerumen softening eardrops and then per office policy mixture of water and hydroperoxide was used to irrigate impaction.  Impaction removed TMs within normal limits post irrigation ?

## 2021-07-19 NOTE — Telephone Encounter (Signed)
According to my office notes from his visit we were considering changing atenolol to amlodipine, but he has experienced pedal edema with amlodipine so we decided to continue atenolol 50 mg, HCTZ 25 mg, lisinopril 40 mg. ? ?He is correct, continue all meds. ?Atenolol was discontinued without being placed back onto med list. ? ?See assessment and plan from office visit. ? ? ?Please notify patient that he needs to double his dose of vitamin B12 to 1000 mg daily.  I recommend repeat B12 level in 2 months.  Lab only appointment is fine. ? ?

## 2021-07-19 NOTE — Telephone Encounter (Signed)
Patient states he is taking Vitamin B12 500 mg once daily. Does he need to increase or start injections? ? ?Also, patient states he is taking Lisinopril 40 mg, HCTZ 25 mg and Atenolol 50 mg daily. Per his med list this was taking out by PCP during visit In March. Patient states when he saw PCP last he confirmed that nothing was changed and was told no to continue all of his b/p medications. Advised patient that I would check with PCP to confirm what he is to take for b/p to make sure. ?

## 2021-07-20 NOTE — Telephone Encounter (Signed)
Called and lvm for pt to return out call. ?

## 2021-07-25 NOTE — Telephone Encounter (Signed)
I spoke to pt. He said he started taking 1000 mg of B12 a few days ago. I made him a lab appt in 2 months. ?

## 2021-07-25 NOTE — Telephone Encounter (Signed)
Called and left a message for him to call us back. ?

## 2021-07-30 ENCOUNTER — Telehealth: Payer: Self-pay

## 2021-07-30 NOTE — Chronic Care Management (AMB) (Signed)
? ? ?  Chronic Care Management ?Pharmacy Assistant  ? ?Name: Kenneth [name]  MRN: [id number] DOB: Dec 29, 1955 ? ? ?Reason for Encounter: Reminder Call ?  ?Conditions to be addressed/monitored: ?HTN, HLD, and DMII ? ?Medications: ?Outpatient Encounter Medications as of 07/30/2021  ?Medication Sig  ? Accu-Chek FastClix Lancets MISC USE TO CHECK BLOOD SUGAR UP TO 4 TIMES A DAY  ? ACCU-CHEK GUIDE test strip USE TO CHECK UP TO 4 TIMES A DAY  ? APPLE CIDER VINEGAR PO Take by mouth.  ? Ascorbic Acid (VITAMIN C) 1000 MG tablet Take 1,000 mg by mouth daily.  ? atenolol (TENORMIN) 50 MG tablet Take 50 mg by mouth daily.  ? atorvastatin (LIPITOR) 20 MG tablet Take 1 tablet (20 mg total) by mouth daily. for cholesterol.  ? Biotin 10 MG CAPS Take by mouth.  ? blood glucose meter kit and supplies KIT Dispense based on patient and insurance preference. Use up to four times daily as directed. (FOR ICD-9 250.00, 250.01).  ? COCONUT OIL PO Take by mouth daily.  ? Cyanocobalamin (VITAMIN B-12 PO) Take 1 tablet by mouth daily.  ? diclofenac (VOLTAREN) 75 MG EC tablet TAKE ONE TABLET EACH DAY AS NEEDED FOR MODERATE PAIN  ? diphenhydrAMINE (BENADRYL) 25 MG tablet Take 25 mg by mouth every 6 (six) hours as needed.  ? fluticasone (FLONASE) 50 MCG/ACT nasal spray Place 1 spray into both nostrils 2 (two) times daily as needed for allergies or rhinitis.  ? GARLIC PO Take 1 tablet by mouth daily.   ? Ginger, Zingiber officinalis, (GINGER PO) Take by mouth.  ? hydrochlorothiazide (HYDRODIURIL) 25 MG tablet TAKE 1 TABLET BY MOUTH EVERY DAY  ? lisinopril (ZESTRIL) 40 MG tablet TAKE 1 TABLET BY MOUTH EVERY DAY  ? magnesium oxide (MAG-OX) 400 MG tablet Take 400 mg by mouth daily.  ? metFORMIN (GLUCOPHAGE) 500 MG tablet Take 1 tablet (500 mg total) by mouth 2 (two) times daily with a meal. For diabetes.  ? methocarbamol (ROBAXIN) 500 MG tablet TAKE ONE TABLET EACH DAY AS NEEDED FOR MUSCLE SPASM  ? Multiple Vitamins-Minerals (MULTIVITAMIN ADULT) TABS Take  1 tablet by mouth daily.  ? Omega-3 Fatty Acids (OMEGA 3 PO) Take 1 capsule by mouth daily.  ? sertraline (ZOLOFT) 50 MG tablet Take 1 tablet (50 mg total) by mouth daily. For anxiety and depression.  ? ?No facility-administered encounter medications on file as of 07/30/2021.  ? ?Sharmaine Base was contacted to remind of upcoming telephone visit with [name] on 08/02/21 at 3:00pm. Patient was reminded to have any blood glucose and blood pressure readings available for review at appointment.  ? ?Patient confirmed appointment. ? ? ?Are you having any problems with your medications? No  ? ?Do you have any concerns you like to discuss with the pharmacist? No  Patient states he would like to discuss metformin and ER ( if applicable)  only wants to take 1 a day  ? ? ? ?Star Rating Drugs: ?Medication:  Last Fill: Day Supply ?Atorvastatin 20mg  06/11/21 30 ?Lisinopril 40mg  05/28/21 90 ?Metformin 500mg  04/13/21 90  Sometimes he misses the evening dose ? ?[name], CPP notified ? ?Ranae Casebier, CCMA ?Health concierge  ?[phone]  ? ? ?

## 2021-08-02 ENCOUNTER — Telehealth: Payer: Self-pay | Admitting: Pharmacist

## 2021-08-02 ENCOUNTER — Telehealth: Payer: Medicare Other

## 2021-08-02 NOTE — Telephone Encounter (Signed)
?  Chronic Care Management  ? ?Outreach Note ? ?08/02/2021 ?Name: Kenneth [name] MRN: [id number] DOB: 1955-06-16 ? ?Referred by: Pleas Koch, NP ? ?Patient had a phone appointment scheduled with clinical pharmacist today. ? ?An unsuccessful telephone outreach was attempted today. The patient was referred to the pharmacist for assistance with medications, care management and care coordination.  ? ?Patient will NOT be penalized in any way for missing a CCM appointment. The no-show fee does not apply. ? ?If possible, a message was left to return call to: [phone] or to [facility]. ? ?[name], PharmD, BCACP ?Clinical Pharmacist ?[facility] ?[phone] ? ? ?

## 2021-08-02 NOTE — Progress Notes (Deleted)
Chronic Care Management Pharmacy Note  08/02/2021 Name:  Kenneth [name] MRN:  [id number] DOB:  04/08/1955  Summary: CCM F/U visit -Pt reports BP at home 137/87 -Pt tried decaf coffee, he could not make it work; advised to use regular coffee in AM and switch to decaf for subsequent cups later in the day -Pt was previously taking cough syrup 2-3 times per day; we had discussed issues with DM products and high BP; his cough has resolved so he stopped using it so much  Recommendations/Changes made from today's visit: -Advised to get 2nd Shingrix vaccine at [facility] [place] [facility] at upcoming PCP appt  Plan: -[facility] will call patient in 2 months for BP log -Pharmacist follow up televisit scheduled for 3 months -PCP f/u 12/20/21   Subjective: Kenneth [name] is an 66 y.o. year old male who is a primary patient of Pleas Koch, NP.  The CCM team was consulted for assistance with disease management and care coordination needs.    Engaged with patient by telephone for follow up visit in response to provider referral for pharmacy case management and/or care coordination services.   Consent to Services:  The patient was given information about Chronic Care Management services, agreed to services, and gave verbal consent prior to initiation of services.  Please see initial visit note for detailed documentation.   Patient Care Team: Pleas Koch, NP as PCP - General (Internal Medicine) Unk Pinto, MD as Attending Physician (Internal Medicine) Charlton Haws, [facility] as Pharmacist (Pharmacist)  Recent office visits: 06/06/21 NP Allie Bossier OV: annual exam. Labs stable, B12 low - start B12 injections. Repeat B12 lab 4 months. RTC 6 months for DM.  12/07/20-PCP-[name],NP-Patient presented for follow up diabetes. New A1c 6.2-no medication changes. F/U 6 months.  Recent consult visits: 03/20/21-[facility] presented for skin lesion.Keep appt with dermatology upcoming.Apply neosporin twice daily 11/22/20-Physical Therapy- [facility]- multiple visits for back pain    Hospital visits: None in previous 6 months   Objective:  Lab Results  Component Value Date   CREATININE 0.81 06/06/2021   BUN 15 06/06/2021   GFR 92.55 06/06/2021   GFRNONAA >60 08/05/2017   GFRAA >60 08/05/2017   NA 141 06/06/2021   K 4.2 06/06/2021   CALCIUM 9.3 06/06/2021   CO2 30 06/06/2021   GLUCOSE 108 (H) 06/06/2021    Lab Results  Component Value Date/Time   HGBA1C 6.3 06/06/2021 03:09 PM   HGBA1C 6.2 (A) 12/07/2020 11:17 AM   HGBA1C 6.6 (H) 05/25/2020 03:09 PM   HGBA1C 6.5 (A) 05/25/2020 02:49 PM   GFR 92.55 06/06/2021 03:09 PM   GFR 90.92 05/25/2020 03:09 PM   MICROALBUR 10.4 06/27/2016 04:29 PM    Last diabetic Eye exam:  Lab Results  Component Value Date/Time   HMDIABEYEEXA No Retinopathy 06/19/2021 12:00 AM    Last diabetic Foot exam: No results found for: HMDIABFOOTEX   Lab Results  Component Value Date   CHOL 128 06/06/2021   HDL 34.70 (L) 06/06/2021   LDLCALC 71 05/25/2020   LDLDIRECT 78.0 06/06/2021   TRIG 247.0 (H) 06/06/2021   CHOLHDL 4 06/06/2021       Latest Ref Rng & Units 06/06/2021    3:09 PM 05/25/2020    3:09 PM 05/05/2019    1:17 PM  Hepatic Function  Total Protein 6.0 - 8.3 g/dL 6.5   7.1   6.5    Albumin 3.5 -  5.2 g/dL 4.1   4.3   3.9    AST 0 - 37 U/L 23   16   18     ALT 0 - 53 U/L 33   22   27    Alk Phosphatase 39 - 117 U/L 71   82   62    Total Bilirubin 0.2 - 1.2 mg/dL 0.4   0.4   0.4      Lab Results  Component Value Date/Time   TSH 0.89 04/17/2018 09:15 AM       Latest Ref Rng & Units 06/06/2021    3:09 PM 05/25/2020    3:09 PM 11/18/2018    1:09 PM  CBC  WBC 4.0 - 10.5 K/uL 11.6   11.4   10.3    Hemoglobin 13.0 - 17.0 g/dL 14.4   15.4   14.6    Hematocrit 39.0 - 52.0 % 42.2   45.6   42.2    Platelets 150.0 - 400.0 K/uL 371.0   395.0    374.0      No results found for: VD25OH  Clinical ASCVD: No  The ASCVD Risk score (Arnett DK, et al., 2019) failed to calculate for the following reasons:   The valid total cholesterol range is 130 to 320 mg/dL       05/05/2019   10:43 AM 04/30/2018   12:36 PM 04/15/2017   11:30 AM  Depression screen PHQ 2/9  Decreased Interest 0 0 0  Down, Depressed, Hopeless 0 0 0  PHQ - 2 Score 0 0 0  Altered sleeping 0 0 1  Tired, decreased energy 0 0 0  Change in appetite 0 0 1  Feeling bad or failure about yourself  0 0 0  Trouble concentrating 0 0 0  Moving slowly or fidgety/restless 0 0 1  Suicidal thoughts 0 0 0  PHQ-9 Score 0 0 3  Difficult doing work/chores Not difficult at all Not difficult at all Not difficult at all     Social History   Tobacco Use  Smoking Status Every Day   Packs/day: 1.00   Years: 41.00   Pack years: 41.00   Types: Cigarettes  Smokeless Tobacco Never   BP Readings from Last 3 Encounters:  07/18/21 (!) 154/88  06/06/21 138/80  03/20/21 (!) 176/74   Pulse Readings from Last 3 Encounters:  07/18/21 70  06/06/21 87  03/20/21 68   Wt Readings from Last 3 Encounters:  07/18/21 220 lb (99.8 kg)  06/06/21 230 lb (104.3 kg)  12/07/20 222 lb (100.7 kg)   BMI Readings from Last 3 Encounters:  07/18/21 33.45 kg/m  06/06/21 34.97 kg/m  12/07/20 33.75 kg/m    Assessment/Interventions: Review of patient past medical history, allergies, medications, health status, including review of consultants reports, laboratory and other test data, was performed as part of comprehensive evaluation and provision of chronic care management services.   SDOH:  (Social Determinants of Health) assessments and interventions performed: Yes  SDOH Screenings   Alcohol Screen: Not on file  Depression (XVQ0-0): Not on file  Financial Resource Strain: Not on file  Food Insecurity: Not on file  Housing: Not on file  Physical Activity: Not on file  Social Connections:  Not on file  Stress: Not on file  Tobacco Use: High Risk   Smoking Tobacco Use: Every Day   Smokeless Tobacco Use: Never   Passive Exposure: Not on file  Transportation Needs: Not on file    [place]  No Known Allergies  Medications Reviewed Today     Reviewed by Pleas Koch, NP (Nurse Practitioner) on 06/06/21 at [place] List Status: <None>   Medication Order Taking? Sig Documenting Provider Last Dose Status Informant  Accu-Chek FastClix Lancets MISC [id number] Yes USE TO CHECK BLOOD SUGAR UP TO 4 TIMES A DAY Pleas Koch, NP Taking Active   ACCU-CHEK GUIDE test strip [id number] Yes USE TO CHECK UP TO 4 TIMES A DAY Pleas Koch, NP Taking Active   APPLE CIDER VINEGAR PO [id number] Yes Take by mouth. [provider] Taking Active   Ascorbic Acid (VITAMIN C) 1000 MG tablet [id number] Yes Take 1,000 mg by mouth daily. [provider] Taking Active Self  atorvastatin (LIPITOR) 20 MG tablet [id number] Yes Take 1 tablet (20 mg total) by mouth daily. For cholesterol. Pleas Koch, NP Taking Active   Biotin 10 MG CAPS [id number] Yes Take by mouth. [provider] Taking Active   blood glucose meter kit and supplies KIT [id number] Yes Dispense based on patient and insurance preference. Use up to four times daily as directed. (FOR ICD-9 250.00, 250.01). Pleas Koch, NP Taking Active   coconut oil OIL [id number] Yes Apply 1 application topically as needed. [provider] Taking Active   Cyanocobalamin (VITAMIN B-12 PO) [id number] Yes Take 1 tablet by mouth daily. [provider] Taking Active Self  diclofenac (VOLTAREN) 75 MG EC tablet [id number] Yes TAKE ONE TABLET EACH DAY AS NEEDED FOR MODERATE PAIN Pleas Koch, NP Taking Active   diphenhydrAMINE (BENADRYL) 25 MG tablet [id number] Yes Take 25 mg by mouth every 6 (six) hours as needed. [provider] Taking Active   fluticasone (FLONASE) 50 MCG/ACT nasal  spray [id number] Yes Place 1 spray into both nostrils 2 (two) times daily as needed for allergies or rhinitis. Pleas Koch, NP Taking Active   GARLIC PO [id number] Yes Take 1 tablet by mouth daily.  [provider] Taking Active Self  Ginger, Zingiber officinalis, (GINGER PO) [id number] Yes Take by mouth. [provider] Taking Active   hydrochlorothiazide (HYDRODIURIL) 25 MG tablet [id number] Yes TAKE 1 TABLET BY MOUTH EVERY DAY Pleas Koch, NP Taking Active   lisinopril (ZESTRIL) 40 MG tablet [id number] Yes TAKE 1 TABLET BY MOUTH EVERY DAY Lesleigh Noe, MD Taking Active   magnesium oxide (MAG-OX) 400 MG tablet [id number] Yes Take 400 mg by mouth daily. [provider] Taking Active   metFORMIN (GLUCOPHAGE) 500 MG tablet [id number] Yes Take 1 tablet (500 mg total) by mouth 2 (two) times daily with a meal. For diabetes. Pleas Koch, NP Taking Active   methocarbamol (ROBAXIN) 500 MG tablet [id number] Yes TAKE ONE TABLET EACH DAY AS NEEDED FOR MUSCLE SPASM Pleas Koch, NP Taking Active   Multiple Vitamins-Minerals (MULTIVITAMIN ADULT) TABS [id number] Yes Take 1 tablet by mouth daily. [provider] Taking Active Self  Omega-3 Fatty Acids (OMEGA 3 PO) [id number] Yes Take 1 capsule by mouth daily. [provider] Taking Active Self  sertraline (ZOLOFT) 50 MG tablet [id number] Yes Take 1 tablet (50 mg total) by mouth daily. For anxiety and depression. Pleas Koch, NP Taking Active             Patient Active Problem List   Diagnosis Date Noted   Impacted cerumen of left ear 07/18/2021   Venous reflux 06/06/2021   Acute thoracic back pain 05/14/2019   Lower extremity numbness 04/17/2018   Rhinorrhea  04/08/2018   Vitamin B 12 deficiency 07/28/2017   Tobacco abuse 05/05/2017   Preventative health care 04/24/2017   Numbness and tingling 04/15/2017   OSA (obstructive sleep apnea) 02/07/2017   Epigastric abdominal tenderness  without rebound tenderness 09/10/2016   GERD without esophagitis 09/10/2016   Nonspecific abnormal electrocardiogram (ECG) (EKG) 09/10/2016   Lower extremity edema 08/12/2016   Type 2 diabetes mellitus ([place]) 06/26/2016   Anxiety and depression 06/26/2016   Hypertension 06/26/2016   Hyperlipidemia 06/26/2016   Chronic neck pain 06/26/2016    Immunization History  Administered Date(s) Administered   Influenza,inj,Quad PF,6+ Mos 04/24/2017, 02/04/2018, 12/07/2020   Janssen (J&J) SARS-COV-2 Vaccination 12/01/2019   Moderna SARS-COV2 Booster Vaccination 04/11/2020   Pneumococcal Polysaccharide-23 05/27/2018   Tdap 10/08/2013   Zoster Recombinat (Shingrix) 05/15/2019    Conditions to be addressed/monitored:  Hypertension, Hyperlipidemia, Diabetes, Depression, Anxiety, and Tobacco use  There are no care plans that you recently modified to display for this patient.     Medication Assistance: None required.  Patient affirms current coverage meets needs.  Compliance/Adherence/Medication fill history: Care Gaps: None  Star-Rating Drugs: Atorvastatin 48m - PDC 53%; LF 07/23/21 x 30 ds; Lisinopril 489m- PDC 91% Metformin 50044m PDC 70%; LF 04/13/21 x 90 ds  Patient's preferred pharmacy is:  [facility] [place]: [phone]: [phone]/pharmacy #[id number] [place] [facility] [address] [id number]: [phone]: [phone] pill box? Yes Pt endorses 90% compliance  - misses meds infrequently  We discussed: Current pharmacy is preferred with insurance plan and patient is satisfied with pharmacy services Patient decided to: Continue current medication management strategy  Care Plan and Follow Up Patient Decision:  Patient agrees to Care Plan and Follow-up.  Plan: Telephone follow up  appointment with care management team member scheduled for:  3 months  LindCharlene BrookearmD, BCACP Clinical Pharmacist [phone] Barriers:  Unable to independently monitor therapeutic efficacy  Pharmacist Clinical Goal(s):  Patient will achieve adherence to monitoring guidelines and medication adherence to achieve therapeutic efficacy through collaboration with PharmD and provider.   Interventions: 1:1 collaboration with ClarPleas Koch regarding development and update of comprehensive plan of care as evidenced by provider attestation and co-signature Inter-disciplinary care team collaboration (see longitudinal plan of care) Comprehensive medication review performed; medication list updated in electronic medical record  Hypertension (BP goal <140/90) -Not ideally controlled - BP is above goal at home sometimes but pt is checking BP inconsistently, sometimes after exertion; he also drinks 4-5 cups of coffee per day - he tried to switch to decaf but could not get going in the morning with decaf coffee -Hx of OSA, could not tolerate CPAP -Current home readings: 137/87 -Denies hypotensive/hypertensive symptoms -Current treatment: Atenolol 50 mg daily - Appropriate, Effective, Safe, Accessible HCTZ 25 mg daily - Appropriate, Effective, Safe, Accessible Lisinopril 40 mg daily - Appropriate, Effective, Safe, Accessible -Educated on BP goals and benefits of medications for prevention of heart attack, stroke and kidney damage; Daily salt intake goal < 2300 mg; Importance of home blood pressure monitoring; Proper BP monitoring technique (take while resting, not within 30 min of coffee or cigarettes) -Counseled to monitor BP at home daily, document, and provide log at future appointments -Previously reviewed  atenolol indication - pt has been on this 15+ years, does not have a compelling indication (HF, Afib, migraine, tremor, etc),  however BP is at goal and alternative drug class ([facility]) may exacerbate chronic LE edema, so he will remain on current regimen -Recommended to continue current medication  Hyperlipidemia: (LDL goal < 100) -Controlled - LDL 78 (05/2021) at goal; pt endorses compliance with statin and denies issues -Current treatment: Atorvastatin 20 mg daily - Appropriate, Effective, Safe, Accessible OTC Omega-3 fish oil -Educated on Cholesterol goals; Benefits of statin for ASCVD risk reduction; -Recommended to continue current medication  Diabetes (A1c goal <7%) -Controlled - A1c 6.3% (05/2021); pt endorses compliance with metformin and denies issues -Current home glucose readings fasting glucose: 105, 111 -Current medications: Metformin 500 mg BID - Appropriate, Effective, Safe, Accessible Testing supplies -Educated on A1c and blood sugar goals; -Counseled to check feet daily and get yearly eye exams -Recommended to continue current medication  Depression/Anxiety (Goal: manage symptoms) -Controlled - per pt report; he is now taking sertraline daily, previously only took PRN -Current treatment: Sertraline 50 mg daily - Appropriate, Effective, Safe, Accessible -Educated on Benefits of medication for symptom control -Recommended to continue current medication  Chronic back/neck pain (Goal: manage pain) -Controlled - pt sees ortho regularly; he takes NSAID prn; cuts Robaxin in half when he needs it; uses maybe twice a week -Current treatment  Diclofenac 75 mg daily PRN - Appropriate, Effective, Safe, Accessibe Methocarbamol 500 mg PRN - Appropriate, Effective, Safe, Accessible -Counseled on risks of chronic NSAIDS (bleeding issues, high BP, kidney damage) -Recommended to continue current medication  Dry cough / postnasal drip (Goal: manage symptoms) -Not ideally controlled - pt reports Flonase has helped some but still endorses sinus issues and cough; he started taking Tussin DM recently -Current  treatment  Tussin DM - Appropriate, Effective, Query Safe Fluticasone nasal spray - Appropriate, Effective, Safe, Accessible -Counseled on risks of DM and BP issues;  -considered lisinopril may be contributing to cough - will reassess after trial of antihistamine -Advised to avoid DM products and try Zyrtec/Claritin/Allegra once daily  Tobacco use (Goal quit smoking) -Smoking 1 ppd; he says he could quit if he wants but he just likes to smoke; first cigarette within 30 min of waking -Pt started smoking at 18. He has quit multiple times before, once for 12 years.  -Discussed risks of continued smoking including high BP, CAD, worsening DM, poor healing, lung disease; offered help with cessation if he desires in the future  Health Maintenance -Vaccine gaps: Prevnar, Shingrix #2, Covid booster -Advised to get Shingrix at pharmacy (Medicare should pay for this in 2023); advised to get Prevnar 20 at next PCP visit in March 2023  Patient Goals/Self-Care Activities Patient will:  - take medications as prescribed as evidenced by patient report and record review focus on medication adherence by pill box check glucose 1-2x weekly, document, and provide at future appointments check blood pressure daily, document, and provide at future appointments engage in dietary modifications by switching to decaf coffee -Avoid DM products; try Zyrtec/Claritin/Allegra instead

## 2021-08-08 NOTE — Telephone Encounter (Signed)
Pt called and request a call back at [phone] ?

## 2021-08-15 ENCOUNTER — Telehealth: Payer: Self-pay | Admitting: Primary Care

## 2021-08-15 NOTE — Telephone Encounter (Signed)
Left message for patient to call back and schedule Medicare Annual Wellness Visit (AWV) either virtually or phone ? ? ?Last AWV 05/05/19 ?please schedule at anytime with health coach ? ?I left my direct # [phone] ?

## 2021-08-21 DIAGNOSIS — C4359 Malignant melanoma of other part of trunk: Secondary | ICD-10-CM | POA: Diagnosis not present

## 2021-08-21 DIAGNOSIS — Z08 Encounter for follow-up examination after completed treatment for malignant neoplasm: Secondary | ICD-10-CM | POA: Diagnosis not present

## 2021-08-21 DIAGNOSIS — C44629 Squamous cell carcinoma of skin of left upper limb, including shoulder: Secondary | ICD-10-CM | POA: Diagnosis not present

## 2021-08-21 DIAGNOSIS — Z85828 Personal history of other malignant neoplasm of skin: Secondary | ICD-10-CM | POA: Diagnosis not present

## 2021-08-23 ENCOUNTER — Encounter (HOSPITAL_COMMUNITY): Payer: Self-pay | Admitting: *Deleted

## 2021-08-23 ENCOUNTER — Other Ambulatory Visit: Payer: Self-pay

## 2021-08-23 ENCOUNTER — Ambulatory Visit (HOSPITAL_COMMUNITY)
Admission: EM | Admit: 2021-08-23 | Discharge: 2021-08-23 | Disposition: A | Discharge status: Home / Self Care | Payer: Medicare Other | Attending: Family Medicine | Admitting: Family Medicine

## 2021-08-23 DIAGNOSIS — T148XXA Other injury of unspecified body region, initial encounter: Secondary | ICD-10-CM | POA: Diagnosis not present

## 2021-08-23 MED ORDER — AMOXICILLIN-POT CLAVULANATE 875-125 MG PO TABS: 1.0000 | ORAL_TABLET | Freq: Two times a day (BID) | ORAL | 0 refills | Status: DC

## 2021-08-23 NOTE — ED Triage Notes (Signed)
Pt reports a concert Squirrel fell on his Lt lower leg yesterday. Pt's leg is red with small skin tear.

## 2021-08-28 NOTE — ED Provider Notes (Signed)
[facility]   [id number] 08/23/21 Arrival Time: 1952  ASSESSMENT & PLAN:  1. Avulsion of skin    Meds ordered this encounter  Medications   amoxicillin-clavulanate (AUGMENTIN) 875-125 MG tablet    Sig: Take 1 tablet by mouth every 12 (twelve) hours.    Dispense:  14 tablet    Refill:  0   Simple wound care discussed. No repair warranted. Baseline LE edema. Will follow up with PCP or here if worsening or failing to improve as anticipated. Reviewed expectations re: course of current medical issues. Questions answered. Outlined signs and symptoms indicating need for more acute intervention. Patient verbalized understanding. After Visit Summary given.   SUBJECTIVE:  Kenneth [name] is a 66 y.o. male who presents with a skin complaint. Reports object vs LLE yesterday. Small skin tear. Minimal bleeding; controlled. Ambulatory. No extremity sensation changes or weakness. Here for wound check. Has noted some erythema around wound this morning. Afebrile.  ROS: As per HPI.  OBJECTIVE: Vitals:   08/23/21 2026  BP: 131/82  Pulse: 66  Resp: 18  Temp: 98.8 F (37.1 C)  SpO2: 99%    General appearance: alert; no distress HEENT: [place]; AT Neck: supple with FROM Lungs: clear to auscultation bilaterally Heart: regular rate and rhythm Extremities: baseline edema; moves all extremities normally Skin: warm and dry; jagged skin tear of lateral LLE; no bleeding; with surrounding erythema; is fairly warm to touch Psychological: alert and cooperative; normal mood and affect  No Known Allergies  Past Medical History:  Diagnosis Date   Anxiety and depression    Bipolar 1 disorder (HCC)    Blood transfusion without reported diagnosis    Chronic kidney disease    Depression    GERD (gastroesophageal reflux disease)    Hypercholesterolemia    Hypertension    Sleep apnea    Type 2 diabetes mellitus (HCC)    Social History   Socioeconomic History   Marital status: Divorced     Spouse name: Not on file   Number of children: Not on file   Years of education: Not on file   Highest education level: Not on file  Occupational History   Not on file  Tobacco Use   Smoking status: Every Day    Packs/day: 1.00    Years: 41.00    Pack years: 41.00    Types: Cigarettes   Smokeless tobacco: Never  Vaping Use   Vaping Use: Never used  Substance and Sexual Activity   Alcohol use: No   Drug use: No   Sexual activity: Not on file  Other Topics Concern   Not on file  Social History Narrative   Single.   1 daughter, 1 grandchild.   Retired. Once worked in [employer].   Enjoys sports, racing, traveling.    Social Determinants of Health   Financial Resource Strain: Not on file  Food Insecurity: Not on file  Transportation Needs: Not on file  Physical Activity: Not on file  Stress: Not on file  Social Connections: Not on file  Intimate Partner Violence: Not on file   Family History  Problem Relation Age of Onset   Hypertension Mother    Mental illness Mother    Diabetes Mother    Hypertension Father    Lung cancer Father    Diabetes Paternal Grandmother    Colon cancer Maternal Uncle    Esophageal cancer Neg Hx    Liver cancer Neg Hx    Pancreatic cancer Neg Hx  Rectal cancer Neg Hx    Stomach cancer Neg Hx    Past Surgical History:  Procedure Laterality Date   L hand surgery     R knee arthoroscopy     SPLENECTOMY        Vanessa Kick, MD 08/28/21 1003

## 2021-09-03 DIAGNOSIS — D485 Neoplasm of uncertain behavior of skin: Secondary | ICD-10-CM | POA: Diagnosis not present

## 2021-09-04 DIAGNOSIS — Z85828 Personal history of other malignant neoplasm of skin: Secondary | ICD-10-CM | POA: Diagnosis not present

## 2021-09-04 DIAGNOSIS — D225 Melanocytic nevi of trunk: Secondary | ICD-10-CM | POA: Diagnosis not present

## 2021-09-04 DIAGNOSIS — Z08 Encounter for follow-up examination after completed treatment for malignant neoplasm: Secondary | ICD-10-CM | POA: Diagnosis not present

## 2021-09-04 DIAGNOSIS — Z1283 Encounter for screening for malignant neoplasm of skin: Secondary | ICD-10-CM | POA: Diagnosis not present

## 2021-09-04 DIAGNOSIS — D485 Neoplasm of uncertain behavior of skin: Secondary | ICD-10-CM | POA: Diagnosis not present

## 2021-09-04 DIAGNOSIS — Z85820 Personal history of malignant melanoma of skin: Secondary | ICD-10-CM | POA: Diagnosis not present

## 2021-09-05 DIAGNOSIS — C4359 Malignant melanoma of other part of trunk: Secondary | ICD-10-CM | POA: Diagnosis not present

## 2021-09-07 ENCOUNTER — Other Ambulatory Visit: Payer: Self-pay | Admitting: Primary Care

## 2021-09-07 ENCOUNTER — Telehealth: Payer: Self-pay

## 2021-09-07 DIAGNOSIS — E538 Deficiency of other specified B group vitamins: Secondary | ICD-10-CM

## 2021-09-07 NOTE — Telephone Encounter (Signed)
This nurse attempted to call patient three times for telephonic AWV. Message left that we will call back to reschedule for another time.

## 2021-09-11 ENCOUNTER — Ambulatory Visit (HOSPITAL_COMMUNITY)
Admission: EM | Admit: 2021-09-11 | Discharge: 2021-09-11 | Disposition: A | Discharge status: Home / Self Care | Payer: Medicare Other | Attending: Family Medicine | Admitting: Family Medicine

## 2021-09-11 ENCOUNTER — Other Ambulatory Visit: Payer: Self-pay

## 2021-09-11 ENCOUNTER — Encounter (HOSPITAL_COMMUNITY): Payer: Self-pay | Admitting: *Deleted

## 2021-09-11 DIAGNOSIS — S0501XA Injury of conjunctiva and corneal abrasion without foreign body, right eye, initial encounter: Secondary | ICD-10-CM

## 2021-09-11 DIAGNOSIS — T1591XA Foreign body on external eye, part unspecified, right eye, initial encounter: Secondary | ICD-10-CM

## 2021-09-11 MED ORDER — OFLOXACIN 0.3 % OP SOLN: 1.0000 [drp] | Freq: Four times a day (QID) | OPHTHALMIC | 0 refills | Status: DC

## 2021-09-11 MED ORDER — FLUORESCEIN SODIUM 1 MG OP STRP
ORAL_STRIP | OPHTHALMIC | Status: AC
  Filled 2021-09-11: qty 1

## 2021-09-11 MED ORDER — TETRACAINE HCL 0.5 % OP SOLN
OPHTHALMIC | Status: AC
  Filled 2021-09-11: qty 4

## 2021-09-11 NOTE — ED Triage Notes (Signed)
T reports a foreign body in RT eye.

## 2021-09-11 NOTE — Discharge Instructions (Signed)
We removed a small piece of debris from under your upper eyelid.  You have a scratch on your eye.  Please use the eyedrops as prescribed.  I do recommend that you follow-up with an eye doctor.  Please call to schedule an appointment.  If anything worsens you need to be seen immediately.

## 2021-09-11 NOTE — ED Provider Notes (Signed)
[facility]    CSN: [id number] Arrival date & time: 09/11/21  1859      History   Chief Complaint Chief Complaint  Patient presents with   Foreign Body in Eye    HPI Kenneth [name] is a 66 y.o. male.   Patient presents today with a several hour history of foreign body sensation in his right eye.  Reports that he was working under a car on an engine when he felt something go into his eye.  He does not believe that it was metal but believes it was dirt.  Since that time he has had irritation to the right eye as well as photophobia.  He is having difficulty keeping his eye open due to discomfort.  He has not tried any over-the-counter drops.  He does wear glasses and was wearing glasses when this occurred but not safety glasses.  Does not wear contacts.  Denies any headache, dizziness, nausea, vomiting.    Past Medical History:  Diagnosis Date   Anxiety and depression    Bipolar 1 disorder ([place])    Blood transfusion without reported diagnosis    Chronic kidney disease    Depression    GERD (gastroesophageal reflux disease)    Hypercholesterolemia    Hypertension    Sleep apnea    Type 2 diabetes mellitus ([place])     Patient Active Problem List   Diagnosis Date Noted   Impacted cerumen of left ear 07/18/2021   Venous reflux 06/06/2021   Acute thoracic back pain 05/14/2019   Lower extremity numbness 04/17/2018   Rhinorrhea 04/08/2018   Vitamin B 12 deficiency 07/28/2017   Tobacco abuse 05/05/2017   Preventative health care 04/24/2017   Numbness and tingling 04/15/2017   OSA (obstructive sleep apnea) 02/07/2017   Epigastric abdominal tenderness without rebound tenderness 09/10/2016   GERD without esophagitis 09/10/2016   Nonspecific abnormal electrocardiogram (ECG) (EKG) 09/10/2016   Lower extremity edema 08/12/2016   Type 2 diabetes mellitus ([place]) 06/26/2016   Anxiety and depression 06/26/2016   Hypertension 06/26/2016   Hyperlipidemia 06/26/2016   Chronic  neck pain 06/26/2016    Past Surgical History:  Procedure Laterality Date   L hand surgery     R knee arthoroscopy     SPLENECTOMY         Home Medications    Prior to Admission medications   Medication Sig Start Date End Date Taking? Authorizing Provider  ofloxacin (OCUFLOX) 0.3 % ophthalmic solution Place 1 drop into the right eye 4 (four) times daily. 09/11/21  Yes Jamita Mckelvin, Derry Skill, PA-C  Accu-Chek FastClix Lancets MISC USE TO CHECK BLOOD SUGAR UP TO 4 TIMES A DAY 04/16/19   Pleas Koch, NP  ACCU-CHEK GUIDE test strip USE TO CHECK UP TO 4 TIMES A DAY 04/16/19   Pleas Koch, NP  amoxicillin-clavulanate (AUGMENTIN) 875-125 MG tablet Take 1 tablet by mouth every 12 (twelve) hours. 08/23/21   Vanessa Kick, MD  APPLE CIDER VINEGAR PO Take by mouth.    [provider]  Ascorbic Acid (VITAMIN C) 1000 MG tablet Take 1,000 mg by mouth daily.    [provider]  atenolol (TENORMIN) 50 MG tablet Take 50 mg by mouth daily.    [provider]  atorvastatin (LIPITOR) 20 MG tablet Take 1 tablet (20 mg total) by mouth daily. for cholesterol. 06/10/21   Pleas Koch, NP  Biotin 10 MG CAPS Take by mouth.    [provider]  blood glucose meter kit and supplies KIT Dispense based on patient and insurance preference. Use up to four times daily as directed. (FOR ICD-9 250.00, 250.01). 11/18/18   Pleas Koch, NP  COCONUT OIL PO Take by mouth daily.    [provider]  Cyanocobalamin (VITAMIN B-12 PO) Take 1 tablet by mouth daily.    [provider]  diclofenac (VOLTAREN) 75 MG EC tablet TAKE ONE TABLET EACH DAY AS NEEDED FOR MODERATE PAIN 04/20/21   Pleas Koch, NP  diphenhydrAMINE (BENADRYL) 25 MG tablet Take 25 mg by mouth every 6 (six) hours as needed.    [provider]  fluticasone (FLONASE) 50 MCG/ACT nasal spray Place 1 spray into both nostrils 2 (two) times daily as needed for allergies or rhinitis. 05/28/21    Pleas Koch, NP  GARLIC PO Take 1 tablet by mouth daily.     [provider]  Ginger, Zingiber officinalis, (GINGER PO) Take by mouth.    [provider]  hydrochlorothiazide (HYDRODIURIL) 25 MG tablet TAKE 1 TABLET BY MOUTH EVERY DAY 01/25/20   Pleas Koch, NP  lisinopril (ZESTRIL) 40 MG tablet TAKE 1 TABLET BY MOUTH EVERY DAY 10/12/20   Lesleigh Noe, MD  magnesium oxide (MAG-OX) 400 MG tablet Take 400 mg by mouth daily.    [provider]  metFORMIN (GLUCOPHAGE) 500 MG tablet Take 1 tablet (500 mg total) by mouth 2 (two) times daily with a meal. For diabetes. 12/07/20   Pleas Koch, NP  methocarbamol (ROBAXIN) 500 MG tablet TAKE ONE TABLET EACH DAY AS NEEDED FOR MUSCLE SPASM 07/09/21   Pleas Koch, NP  Multiple Vitamins-Minerals (MULTIVITAMIN ADULT) TABS Take 1 tablet by mouth daily.    [provider]  Omega-3 Fatty Acids (OMEGA 3 PO) Take 1 capsule by mouth daily.    [provider]  sertraline (ZOLOFT) 50 MG tablet Take 1 tablet (50 mg total) by mouth daily. For anxiety and depression. 12/01/20   Pleas Koch, NP    Family History Family History  Problem Relation Age of Onset   Hypertension Mother    Mental illness Mother    Diabetes Mother    Hypertension Father    Lung cancer Father    Diabetes Paternal Grandmother    Colon cancer Maternal Uncle    Esophageal cancer Neg Hx    Liver cancer Neg Hx    Pancreatic cancer Neg Hx    Rectal cancer Neg Hx    Stomach cancer Neg Hx     Social History Social History   Tobacco Use   Smoking status: Every Day    Packs/day: 1.00    Years: 41.00    Total pack years: 41.00    Types: Cigarettes   Smokeless tobacco: Never  Vaping Use   Vaping Use: Never used  Substance Use Topics   Alcohol use: No   Drug use: No     Allergies   Patient has no known allergies.   Review of Systems Review of Systems  Eyes:  Positive for photophobia, pain and visual  disturbance. Negative for discharge, redness and itching.  Gastrointestinal:  Negative for abdominal pain, diarrhea, nausea and vomiting.  Neurological:  Negative for dizziness, light-headedness and headaches.     Physical Exam Triage Vital Signs ED Triage Vitals  Enc Vitals Group     BP 09/11/21 2001 138/69     Pulse Rate 09/11/21 2001 (!) 55     Resp 09/11/21  2001 18     Temp 09/11/21 2001 97.8 F (36.6 C)     Temp src --      SpO2 09/11/21 2001 93 %     Weight --      Height --      Head Circumference --      Peak Flow --      Pain Score 09/11/21 1958 3     Pain Loc --      Pain Edu? --      Excl. in [place]? --    No data found.  Updated Vital Signs BP 138/69   Pulse (!) 55   Temp 97.8 F (36.6 C)   Resp 18   SpO2 93%   Visual Acuity Right Eye Distance: 20/70 with correction Left Eye Distance: 20/30 with correction Bilateral Distance: 20/30 with correction  Right Eye Near:   Left Eye Near:    Bilateral Near:     Physical Exam Vitals reviewed.  Constitutional:      General: He is awake.     Appearance: Normal appearance. He is well-developed. He is not ill-appearing.     Comments: Very pleasant male appears stated age in no acute distress sitting comfortably in exam room  HENT:     Head: Normocephalic and atraumatic.     Mouth/Throat:     Pharynx: No oropharyngeal exudate, posterior oropharyngeal erythema or uvula swelling.  Eyes:     Pupils:     Left eye: Corneal abrasion and fluorescein uptake present.   Cardiovascular:     Rate and Rhythm: Normal rate and regular rhythm.     Heart sounds: Normal heart sounds, S1 normal and S2 normal. No murmur heard. Pulmonary:     Effort: Pulmonary effort is normal.     Breath sounds: Normal breath sounds. No stridor. No wheezing, rhonchi or rales.     Comments: Clear to auscultation bilaterally Neurological:     Mental Status: He is alert.  Psychiatric:        Behavior: Behavior is cooperative.      UC  Treatments / Results  Labs (all labs ordered are listed, but only abnormal results are displayed) Labs Reviewed - No data to display  EKG   Radiology No results found.  Procedures Procedures (including critical care time)  Medications Ordered in UC Medications - No data to display  Initial Impression / Assessment and Plan / UC Course  I have reviewed the triage vital signs and the nursing notes.  Pertinent labs & imaging results that were available during my care of the patient were reviewed by me and considered in my medical decision making (see chart for details).     Fluorescein staining was done in clinic which showed increased uptake/abrasion on medial right cornea.  Inverted eyelids and was able to remove a small piece of debris with improvement of symptoms.  We will start ofloxacin 1 drop 4 times daily.  Recommended that he follow-up with ophthalmology as soon as possible; he is already established and will call his ophthalmologist but was also given the contact information for the on-call provider in case he has difficulty being seen.  Discussed that if he has any worsening symptoms including increased pain, visual disturbance, nausea, vomiting he needs to be seen immediately to which he expressed understanding.  Final Clinical Impressions(s) / UC Diagnoses   Final diagnoses:  Foreign body of right eye, initial encounter  Abrasion of right cornea, initial encounter     Discharge  Instructions      We removed a small piece of debris from under your upper eyelid.  You have a scratch on your eye.  Please use the eyedrops as prescribed.  I do recommend that you follow-up with an eye doctor.  Please call to schedule an appointment.  If anything worsens you need to be seen immediately.     ED Prescriptions     Medication Sig Dispense Auth. Provider   ofloxacin (OCUFLOX) 0.3 % ophthalmic solution Place 1 drop into the right eye 4 (four) times daily. 5 mL Brigitta Pricer K,  PA-C      PDMP not reviewed this encounter.   Terrilee Croak, PA-C 09/11/21 2025

## 2021-09-14 DIAGNOSIS — D225 Melanocytic nevi of trunk: Secondary | ICD-10-CM | POA: Diagnosis not present

## 2021-09-14 DIAGNOSIS — D485 Neoplasm of uncertain behavior of skin: Secondary | ICD-10-CM | POA: Diagnosis not present

## 2021-09-15 ENCOUNTER — Other Ambulatory Visit: Payer: Self-pay | Admitting: Primary Care

## 2021-09-15 DIAGNOSIS — J309 Allergic rhinitis, unspecified: Secondary | ICD-10-CM

## 2021-09-17 ENCOUNTER — Telehealth: Payer: Self-pay | Admitting: Primary Care

## 2021-09-17 NOTE — Telephone Encounter (Signed)
Left message for patient to call back and schedule Medicare Annual Wellness Visit (AWV) either virtually or phone   Last AWV 05/05/19   I left my direct # [phone]

## 2021-09-20 ENCOUNTER — Telehealth: Payer: Self-pay

## 2021-09-20 NOTE — Telephone Encounter (Signed)
This nurse called patient for scheduled telephonic AWV. Person that answered the phone stated that we need to reschedule, he is having surgery tomorrow. She said to call sometime next week.

## 2021-09-21 DIAGNOSIS — I10 Essential (primary) hypertension: Secondary | ICD-10-CM | POA: Diagnosis not present

## 2021-09-21 DIAGNOSIS — C439 Malignant melanoma of skin, unspecified: Secondary | ICD-10-CM | POA: Diagnosis not present

## 2021-09-21 DIAGNOSIS — E785 Hyperlipidemia, unspecified: Secondary | ICD-10-CM | POA: Diagnosis not present

## 2021-09-21 DIAGNOSIS — E119 Type 2 diabetes mellitus without complications: Secondary | ICD-10-CM | POA: Diagnosis not present

## 2021-09-21 DIAGNOSIS — R599 Enlarged lymph nodes, unspecified: Secondary | ICD-10-CM | POA: Diagnosis not present

## 2021-09-21 DIAGNOSIS — Z79899 Other long term (current) drug therapy: Secondary | ICD-10-CM | POA: Diagnosis not present

## 2021-09-21 DIAGNOSIS — C4359 Malignant melanoma of other part of trunk: Secondary | ICD-10-CM | POA: Diagnosis not present

## 2021-09-21 DIAGNOSIS — Z85820 Personal history of malignant melanoma of skin: Secondary | ICD-10-CM | POA: Diagnosis not present

## 2021-09-21 DIAGNOSIS — L98499 Non-pressure chronic ulcer of skin of other sites with unspecified severity: Secondary | ICD-10-CM | POA: Diagnosis not present

## 2021-09-21 HISTORY — PX: MELANOMA EXCISION: SHX5266

## 2021-09-25 ENCOUNTER — Other Ambulatory Visit: Payer: Medicare Other

## 2021-10-09 DIAGNOSIS — C4359 Malignant melanoma of other part of trunk: Secondary | ICD-10-CM | POA: Diagnosis not present

## 2021-10-09 DIAGNOSIS — Z09 Encounter for follow-up examination after completed treatment for conditions other than malignant neoplasm: Secondary | ICD-10-CM | POA: Diagnosis not present

## 2021-10-10 ENCOUNTER — Other Ambulatory Visit: Payer: Self-pay | Admitting: Primary Care

## 2021-10-10 DIAGNOSIS — I10 Essential (primary) hypertension: Secondary | ICD-10-CM

## 2021-10-15 ENCOUNTER — Other Ambulatory Visit: Payer: Medicare Other

## 2021-11-02 ENCOUNTER — Other Ambulatory Visit: Payer: Self-pay | Admitting: Primary Care

## 2021-11-02 DIAGNOSIS — F419 Anxiety disorder, unspecified: Secondary | ICD-10-CM

## 2021-11-08 DIAGNOSIS — M48062 Spinal stenosis, lumbar region with neurogenic claudication: Secondary | ICD-10-CM | POA: Diagnosis not present

## 2021-11-22 ENCOUNTER — Telehealth: Payer: Self-pay

## 2021-11-22 DIAGNOSIS — I10 Essential (primary) hypertension: Secondary | ICD-10-CM

## 2021-11-22 NOTE — Progress Notes (Signed)
Patient is in need of his hydrochlorothiazide (HYDRODIURIL) 25 MG tablet refilled please. There are no refills remaining; please send to [facility].   [name], CPP notified  Marijean Niemann, [place] Clinical Pharmacy Assistant [phone]

## 2021-11-23 MED ORDER — HYDROCHLOROTHIAZIDE 25 MG PO TABS: 25.0000 mg | ORAL_TABLET | Freq: Every day | ORAL | 1 refills | Status: DC

## 2021-11-23 NOTE — Telephone Encounter (Signed)
Noted and appreciate the thorough investigation. Refill sent to pharmacy for hydrochlorothiazide.

## 2021-11-23 NOTE — Addendum Note (Signed)
Addended by: Pleas Koch on: 11/23/2021 04:09 PM   Modules accepted: Orders

## 2021-11-23 NOTE — Telephone Encounter (Addendum)
Patient is requesting refill for hydrochlorothiazide 25 mg daily. Since last Rx in chart is from 2021, reviewed fill history at pharmacy:  Last eRx in chart is from 01/25/20 for 90 ds with 1 RF. Per [facility] this Rx was filled 08/01/20 and 11/30/20. More recently HCTZ was filled 06/26/21 (30 ds) and 08/09/21 (90 ds), prescribed by Dr Vira Browns. Pt has history of poor adherence with medications. Pt is now out of refills and requesting Rx from PCP again.  Routing to PCP.

## 2021-12-17 NOTE — Progress Notes (Signed)
[facility] Quality Team Note  Name: Kenneth [name] Date of Birth: 07-03-1955 MRN: [id number] Date: 12/17/2021  [facility] Quality Team has reviewed this patient's chart, please see recommendations below:  [facility] Quality Other; Pt has open quality gap for KED, needs Micro/Creat Urine test to close this. Please address at next visit.

## 2021-12-20 ENCOUNTER — Ambulatory Visit: Payer: Medicare Other | Admitting: Primary Care

## 2021-12-28 ENCOUNTER — Other Ambulatory Visit: Payer: Self-pay | Admitting: Primary Care

## 2021-12-28 DIAGNOSIS — G8929 Other chronic pain: Secondary | ICD-10-CM

## 2022-01-11 ENCOUNTER — Ambulatory Visit: Payer: Medicare Other | Admitting: Primary Care

## 2022-01-15 ENCOUNTER — Other Ambulatory Visit: Payer: Self-pay | Admitting: Primary Care

## 2022-01-15 DIAGNOSIS — E119 Type 2 diabetes mellitus without complications: Secondary | ICD-10-CM

## 2022-01-15 DIAGNOSIS — G8929 Other chronic pain: Secondary | ICD-10-CM

## 2022-01-23 ENCOUNTER — Ambulatory Visit: Payer: Medicare Other | Admitting: Primary Care

## 2022-01-25 ENCOUNTER — Encounter: Payer: Self-pay | Admitting: Primary Care

## 2022-01-25 NOTE — Telephone Encounter (Signed)
All phone numbers in patients chart are disconnected.  Mailing letter to let patient know he needs to schedule office visit for further refills on medications.

## 2022-01-28 ENCOUNTER — Telehealth: Payer: Self-pay | Admitting: Primary Care

## 2022-01-28 NOTE — Telephone Encounter (Signed)
Tried calling patient to schedule Medicare Annual Wellness Visit (AWV) either virtually or phone   No answer   Last AWV 05/05/19    45 min for awv-i and in office appointments 30 min for awv-s  phone/virtual appointments

## 2022-01-29 DIAGNOSIS — M48062 Spinal stenosis, lumbar region with neurogenic claudication: Secondary | ICD-10-CM | POA: Diagnosis not present

## 2022-01-29 DIAGNOSIS — G959 Disease of spinal cord, unspecified: Secondary | ICD-10-CM | POA: Diagnosis not present

## 2022-01-31 DIAGNOSIS — M542 Cervicalgia: Secondary | ICD-10-CM | POA: Diagnosis not present

## 2022-01-31 DIAGNOSIS — M545 Low back pain, unspecified: Secondary | ICD-10-CM | POA: Diagnosis not present

## 2022-02-11 DIAGNOSIS — M5416 Radiculopathy, lumbar region: Secondary | ICD-10-CM | POA: Diagnosis not present

## 2022-02-11 DIAGNOSIS — M5412 Radiculopathy, cervical region: Secondary | ICD-10-CM | POA: Diagnosis not present

## 2022-02-15 ENCOUNTER — Telehealth: Payer: Self-pay

## 2022-02-15 NOTE — Chronic Care Management (AMB) (Signed)
Contacted the patient to check on adherence with lisinopril '40mg'$  and atorvastatin '20mg'$ . Patient states he is aware of the importance in taking these medications without missing doses. The patient reports he has missed 2 appointments with his PCP, due to needing to help out a friend. The office would not authorize refills on lisinopril or atorvastatin. The patient  did schedule a visit on 02/18/22 and will get refills on medications at that time. The patient uses [facility] as his only pharmacy for all his maintenance medications.  [name], CPP notified  [name], [place]  [phone]

## 2022-02-18 ENCOUNTER — Ambulatory Visit: Payer: Medicare Other | Admitting: Primary Care

## 2022-02-18 DIAGNOSIS — M48062 Spinal stenosis, lumbar region with neurogenic claudication: Secondary | ICD-10-CM | POA: Diagnosis not present

## 2022-02-26 DIAGNOSIS — D485 Neoplasm of uncertain behavior of skin: Secondary | ICD-10-CM | POA: Diagnosis not present

## 2022-02-26 DIAGNOSIS — L821 Other seborrheic keratosis: Secondary | ICD-10-CM | POA: Diagnosis not present

## 2022-02-26 DIAGNOSIS — Z08 Encounter for follow-up examination after completed treatment for malignant neoplasm: Secondary | ICD-10-CM | POA: Diagnosis not present

## 2022-02-26 DIAGNOSIS — D225 Melanocytic nevi of trunk: Secondary | ICD-10-CM | POA: Diagnosis not present

## 2022-02-26 DIAGNOSIS — L820 Inflamed seborrheic keratosis: Secondary | ICD-10-CM | POA: Diagnosis not present

## 2022-02-26 DIAGNOSIS — L905 Scar conditions and fibrosis of skin: Secondary | ICD-10-CM | POA: Diagnosis not present

## 2022-02-26 DIAGNOSIS — Z85820 Personal history of malignant melanoma of skin: Secondary | ICD-10-CM | POA: Diagnosis not present

## 2022-02-26 DIAGNOSIS — Z1283 Encounter for screening for malignant neoplasm of skin: Secondary | ICD-10-CM | POA: Diagnosis not present

## 2022-02-28 ENCOUNTER — Ambulatory Visit: Payer: Medicare Other | Admitting: Primary Care

## 2022-02-28 ENCOUNTER — Telehealth: Payer: Self-pay | Admitting: Primary Care

## 2022-02-28 NOTE — Telephone Encounter (Signed)
This patient has no showed his office visits with me 8 times since June 2023. Is there a no show policy? Is there anything we can do about this?

## 2022-03-05 NOTE — Progress Notes (Signed)
[facility] Quality Team Note  Name: Kenneth [name] Date of Birth: 1956/02/26 MRN: [id number] Date: 03/05/2022  [facility] Quality Team has reviewed this patient's chart, please see recommendations below:  [facility] Quality Other; (KED: [place] MICROALBUMIN/CREATININE RATIO TEST COMPLETED FOR GAP CLOSURE. PATIENT HAS UPCOMING APPT 12/7)

## 2022-03-06 ENCOUNTER — Telehealth: Payer: Self-pay

## 2022-03-06 NOTE — Telephone Encounter (Signed)
Pt is aware of his appointment tomorrow and plans to be here.  He is aware of no show policy and reported that he has been thinking about finding a doctor closer to home.  He plans to talk with [name] about this at his appointment.

## 2022-03-06 NOTE — Telephone Encounter (Signed)
Pt has an appointment 03/07/22 at 2pm.  Need to notify pt that if he no shows this appointment our practice will no longer be able to serve him.  Pt has no showed 8 times since June 2023.

## 2022-03-06 NOTE — Telephone Encounter (Signed)
Noted and agree. 

## 2022-03-07 ENCOUNTER — Encounter: Payer: Self-pay | Admitting: Primary Care

## 2022-03-07 ENCOUNTER — Ambulatory Visit (INDEPENDENT_AMBULATORY_CARE_PROVIDER_SITE_OTHER): Payer: Medicare Other | Admitting: Primary Care

## 2022-03-07 VITALS — BP 152/80 | HR 59 | Temp 98.4°F | Ht 68.0 in | Wt 217.0 lb

## 2022-03-07 DIAGNOSIS — F32A Depression, unspecified: Secondary | ICD-10-CM

## 2022-03-07 DIAGNOSIS — E1165 Type 2 diabetes mellitus with hyperglycemia: Secondary | ICD-10-CM | POA: Diagnosis not present

## 2022-03-07 DIAGNOSIS — E785 Hyperlipidemia, unspecified: Secondary | ICD-10-CM

## 2022-03-07 DIAGNOSIS — F419 Anxiety disorder, unspecified: Secondary | ICD-10-CM

## 2022-03-07 DIAGNOSIS — G8929 Other chronic pain: Secondary | ICD-10-CM | POA: Diagnosis not present

## 2022-03-07 DIAGNOSIS — R202 Paresthesia of skin: Secondary | ICD-10-CM

## 2022-03-07 DIAGNOSIS — Z23 Encounter for immunization: Secondary | ICD-10-CM

## 2022-03-07 DIAGNOSIS — I10 Essential (primary) hypertension: Secondary | ICD-10-CM | POA: Diagnosis not present

## 2022-03-07 DIAGNOSIS — M542 Cervicalgia: Secondary | ICD-10-CM

## 2022-03-07 DIAGNOSIS — R200 Anesthesia of skin: Secondary | ICD-10-CM

## 2022-03-07 LAB — POCT GLYCOSYLATED HEMOGLOBIN (HGB A1C): Hemoglobin A1C: 6.3 % — AB (ref 4.0–5.6)

## 2022-03-07 MED ORDER — ACCU-CHEK GUIDE VI STRP: ORAL_STRIP | 3 refills | Status: AC

## 2022-03-07 MED ORDER — ACCU-CHEK FASTCLIX LANCETS MISC: 3 refills | Status: AC

## 2022-03-07 MED ORDER — METHOCARBAMOL 500 MG PO TABS: 500.0000 mg | ORAL_TABLET | Freq: Every day | ORAL | 0 refills | Status: DC | PRN

## 2022-03-07 MED ORDER — VALSARTAN-HYDROCHLOROTHIAZIDE 160-25 MG PO TABS: 1.0000 | ORAL_TABLET | Freq: Every day | ORAL | 0 refills | Status: DC

## 2022-03-07 NOTE — Assessment & Plan Note (Signed)
Above goal today, also above goal in April 2023 and with home readings.   Stop Atenolol 50 mg as he has no evidence of CAD and has no history of MI. Stop lisinopril 40 mg as this doesn't seem to be effective. Stop HCTZ 25 mg.  Start valsartan-HCTZ 160-25 mg once daily.   We will plan to see him back in the office in 2-3 weeks for BP check.

## 2022-03-07 NOTE — Assessment & Plan Note (Signed)
Controlled.  Continue sertraline 50 mg daily.

## 2022-03-07 NOTE — Assessment & Plan Note (Addendum)
Controlled with A1C of 6.3.  He's been off of metformin for > three months.  He would like to remain off metformin for now. Commended him on dietary changes.  We will have him monitor his glucose readings and notify if levels begin to elevate.   Foot exam today. Remain off metformin for now.   Follow up in 3 months.

## 2022-03-07 NOTE — Assessment & Plan Note (Signed)
Following with orthopedics.  Continue diclofenac 75 mg PRN and methocarbamol 500 mg PRN.

## 2022-03-07 NOTE — Patient Instructions (Signed)
Stop taking atenolol for blood pressure. Stop taking hydrochlorothiazide for blood pressure. Stop taking lisinopril for blood pressure.  Start taking valsartan-hydrochlorothiazide 160-25 mg once daily for blood pressure.  Continue to monitor your blood pressure at home.  Schedule a follow-up visit with me for 2 to 3 weeks for blood pressure check.  It was a pleasure to see you today!

## 2022-03-07 NOTE — Assessment & Plan Note (Signed)
Secondary to lumbar spine. Following with neurosurgery. Continue injections.   Continue diclofenac 75 mg daily PRN and methocarbamol 500 mg PRN

## 2022-03-07 NOTE — Assessment & Plan Note (Signed)
Continue atorvastatin 20 mg daily.   Lipid panel reviewed from March 2023

## 2022-03-07 NOTE — Progress Notes (Signed)
Subjective:    Patient ID: Kenneth [name], male    DOB: [birth year], 66 y.o.   MRN: [id number]  HPI  Kenneth [name] is a very pleasant 66 y.o. male with a history of type 2 diabetes, hypertension, OSA, anxiety depression, hyperlipidemia, who presents today for follow-up of chronic conditions.  1) Type 2 Diabetes:   Current medications include: Metformin 500 mg twice daily. He ran out of his metformin three months ago.   He is checking his blood glucose 2 times monthly and is getting readings of:  Fasting in AM: 90's to low 100's.   Last A1C: 6.3 in March 2023, 6.3 today Last Eye Exam: Up-to-date Last Foot Exam: Due Pneumonia Vaccination: 2020 Urine Microalbumin: Due Statin: Atorvastatin repeat  Dietary changes since last visit: Limiting carbs   Exercise: None.  2) Hypertension: Currently managed on atenolol 50 mg daily, lisinopril 40 mg daily, hydrochlorothiazide 25 mg daily. He denies chest pain, dizziness, headaches.   He is checking his BP at home which is running in the 150's/80's normally all  BP Readings from Last 3 Encounters:  03/07/22 (!) 152/80  09/11/21 138/69  08/23/21 131/82     3) Anxiety and Depression: Currently managed on sertraline 50 mg daily. Overall feels well managed on his regimen. He denies SI/HI.  4) Chronic Neck Pain/Chronic Back Pain: Currently managed on methocarbamol 500 mg as needed, diclofenac 75 mg daily as needed.  He continues to take both medications only as needed and sparingly.  Following with neurosurgery, underwent MRI of lumbar spine for bilateral lower extremity numbness earlier this year. He hasn't undergone surgery at this point, but will undergo injections to the lumbar spine, first injection next week.   Following with orthopedics for chronic neck pain. He underwent xray of the neck this year which revealed bone spurs and arthritis. He will need surgery in the future, but he's waiting for back surgery first.   Review of  Systems  Respiratory:  Negative for shortness of breath.   Cardiovascular:  Negative for chest pain.  Neurological:  Positive for numbness. Negative for dizziness.  Psychiatric/Behavioral:  The patient is not nervous/anxious.          Past Medical History:  Diagnosis Date   Anxiety and depression    Bipolar 1 disorder ([place])    Blood transfusion without reported diagnosis    Chronic kidney disease    Depression    GERD (gastroesophageal reflux disease)    Hypercholesterolemia    Hypertension    Sleep apnea    Type 2 diabetes mellitus ([place])     Social History   Socioeconomic History   Marital status: Divorced    Spouse name: Not on file   Number of children: Not on file   Years of education: Not on file   Highest education level: Not on file  Occupational History   Not on file  Tobacco Use   Smoking status: Every Day    Packs/day: 1.00    Years: 41.00    Total pack years: 41.00    Types: Cigarettes   Smokeless tobacco: Never  Vaping Use   Vaping Use: Never used  Substance and Sexual Activity   Alcohol use: No   Drug use: No   Sexual activity: Not on file  Other Topics Concern   Not on file  Social History Narrative   Single.   1 daughter, 1 grandchild.   Retired. Once worked in [employer].   Enjoys sports, racing, traveling.  Social Determinants of Health   Financial Resource Strain: Low Risk  (05/05/2019)   Overall Financial Resource Strain (CARDIA)    Difficulty of Paying Living Expenses: Not hard at all  Food Insecurity: No Food Insecurity (05/05/2019)   Hunger Vital Sign    Worried About Running Out of Food in the Last Year: Never true    Ran Out of Food in the Last Year: Never true  Transportation Needs: No Transportation Needs (05/05/2019)   PRAPARE - Hydrologist (Medical): No    Lack of Transportation (Non-Medical): No  Physical Activity: Inactive (05/05/2019)   Exercise Vital Sign    Days of Exercise per Week: 0 days     Minutes of Exercise per Session: 0 min  Stress: No Stress Concern Present (05/05/2019)   [place]    Feeling of Stress : Not at all  Social Connections: Not on file  Intimate Partner Violence: Not At Risk (05/05/2019)   Humiliation, Afraid, Rape, and Kick questionnaire    Fear of Current or Ex-Partner: No    Emotionally Abused: No    Physically Abused: No    Sexually Abused: No    Past Surgical History:  Procedure Laterality Date   L hand surgery     R knee arthoroscopy     SPLENECTOMY      Family History  Problem Relation Age of Onset   Hypertension Mother    Mental illness Mother    Diabetes Mother    Hypertension Father    Lung cancer Father    Diabetes Paternal Grandmother    Colon cancer Maternal Uncle    Esophageal cancer Neg Hx    Liver cancer Neg Hx    Pancreatic cancer Neg Hx    Rectal cancer Neg Hx    Stomach cancer Neg Hx     No Known Allergies  Current Outpatient Medications on File Prior to Visit  Medication Sig Dispense Refill   APPLE CIDER VINEGAR PO Take by mouth.     Ascorbic Acid (VITAMIN C) 1000 MG tablet Take 1,000 mg by mouth daily.     atorvastatin (LIPITOR) 20 MG tablet Take 1 tablet (20 mg total) by mouth daily. for cholesterol. 90 tablet 3   Biotin 10 MG CAPS Take by mouth.     blood glucose meter kit and supplies KIT Dispense based on patient and insurance preference. Use up to four times daily as directed. (FOR ICD-9 250.00, 250.01). 1 each 0   COCONUT OIL PO Take by mouth daily.     Cyanocobalamin (VITAMIN B-12 PO) Take 1 tablet by mouth daily.     diclofenac (VOLTAREN) 75 MG EC tablet Take 1 tablet (75 mg total) by mouth daily as needed for moderate pain. 90 tablet 0   fluticasone (FLONASE) 50 MCG/ACT nasal spray ONE SPRAY IN EACH NOSTRIL TWICE DAILY AS NEEDED FOR ALLERGIES OR RHINITIS 48 g 0   GARLIC PO Take 1 tablet by mouth daily.      Ginger, Zingiber officinalis, (GINGER  PO) Take by mouth.     magnesium oxide (MAG-OX) 400 MG tablet Take 400 mg by mouth daily.     Multiple Vitamins-Minerals (MULTIVITAMIN ADULT) TABS Take 1 tablet by mouth daily.     ofloxacin (OCUFLOX) 0.3 % ophthalmic solution Place 1 drop into the right eye 4 (four) times daily. 5 mL 0   Omega-3 Fatty Acids (OMEGA 3 PO) Take 1 capsule by mouth  daily.     sertraline (ZOLOFT) 50 MG tablet Take 1 tablet (50 mg total) by mouth daily. for anxiety and depression. Office visit required for further refills. 90 tablet 0   No current facility-administered medications on file prior to visit.    BP (!) 152/80   Pulse (!) 59   Temp 98.4 F (36.9 C) (Temporal)   Ht _0  (1.727 m)   Wt 217 lb (98.4 kg)   SpO2 95%   BMI 32.99 kg/m  Objective:   Physical Exam Cardiovascular:     Rate and Rhythm: Normal rate and regular rhythm.  Pulmonary:     Effort: Pulmonary effort is normal.     Breath sounds: Normal breath sounds. No wheezing or rales.  Musculoskeletal:     Cervical back: Neck supple.  Skin:    General: Skin is warm and dry.  Neurological:     Mental Status: He is alert and oriented to person, place, and time.  Psychiatric:        Mood and Affect: Mood normal.           Assessment & Plan:   Problem List Items Addressed This Visit       Cardiovascular and Mediastinum   Hypertension    Above goal today, also above goal in April 2023 and with home readings.   Stop Atenolol 50 mg as he has no evidence of CAD and has no history of MI. Stop lisinopril 40 mg as this doesn't seem to be effective. Stop HCTZ 25 mg.  Start valsartan-HCTZ 160-25 mg once daily.   We will plan to see him back in the office in 2-3 weeks for BP check.      Relevant Medications   valsartan-hydrochlorothiazide (DIOVAN-HCT) 160-25 MG tablet     Endocrine   Type 2 diabetes mellitus ([place]) - Primary    Controlled with A1C of 6.3.  He's been off of metformin for > three months.  He would like to  remain off metformin for now. Commended him on dietary changes.  We will have him monitor his glucose readings and notify if levels begin to elevate.   Foot exam today. Remain off metformin for now.   Follow up in 3 months.        Relevant Medications   valsartan-hydrochlorothiazide (DIOVAN-HCT) 160-25 MG tablet   Accu-Chek FastClix Lancets MISC   glucose blood (ACCU-CHEK GUIDE) test strip   Other Relevant Orders   Microalbumin/Creatinine Ratio, Urine   POCT glycosylated hemoglobin (Hb A1C) (Completed)     Other   Anxiety and depression    Controlled.  Continue sertraline 50 mg daily.      Hyperlipidemia    Continue atorvastatin 20 mg daily.   Lipid panel reviewed from March 2023      Relevant Medications   valsartan-hydrochlorothiazide (DIOVAN-HCT) 160-25 MG tablet   Chronic neck pain    Following with orthopedics.  Continue diclofenac 75 mg PRN and methocarbamol 500 mg PRN.      Relevant Medications   methocarbamol (ROBAXIN) 500 MG tablet   Numbness and tingling    Secondary to lumbar spine. Following with neurosurgery. Continue injections.   Continue diclofenac 75 mg daily PRN and methocarbamol 500 mg PRN      Other Visit Diagnoses     Need for immunization against influenza       Relevant Orders   Flu Vaccine QUAD High Dose(Fluad) (Completed)          Pleas Koch, NP

## 2022-03-08 LAB — MICROALBUMIN / CREATININE URINE RATIO
Creatinine,U: 61.4 mg/dL
Microalb Creat Ratio: 1.8 mg/g (ref 0.0–30.0)
Microalb, Ur: 1.1 mg/dL (ref 0.0–1.9)

## 2022-03-11 ENCOUNTER — Telehealth: Payer: Self-pay | Admitting: Primary Care

## 2022-03-11 NOTE — Telephone Encounter (Signed)
Patient called and stated he is taking valsartan-hydrochlorothiazide valsartan-hydrochlorothiazide (DIOVAN-HCT) 160-25 MG tablet   and wanted to know if he still should take the linosopril. Call back number [phone].

## 2022-03-11 NOTE — Telephone Encounter (Signed)
Called patient, per chart review on office notes from 12/07 [facility] instruction are below:   Stop taking atenolol for blood pressure. Stop taking hydrochlorothiazide for blood pressure. Stop taking lisinopril for blood pressure.   Start taking valsartan-hydrochlorothiazide 160-25 mg once daily for blood pressure.   Reviewed this information with the patient, he verbalized understanding.

## 2022-03-11 NOTE — Telephone Encounter (Signed)
Unable to reach patient. Left voicemail to return call to our office.   

## 2022-03-12 ENCOUNTER — Telehealth: Payer: Self-pay | Admitting: Primary Care

## 2022-03-12 DIAGNOSIS — M48062 Spinal stenosis, lumbar region with neurogenic claudication: Secondary | ICD-10-CM | POA: Diagnosis not present

## 2022-03-12 NOTE — Telephone Encounter (Signed)
LVM for pt to rtn my call to schedule AWV with [facility] call back # [phone] 

## 2022-03-27 ENCOUNTER — Other Ambulatory Visit: Payer: Self-pay | Admitting: Primary Care

## 2022-03-27 DIAGNOSIS — F419 Anxiety disorder, unspecified: Secondary | ICD-10-CM

## 2022-03-28 ENCOUNTER — Ambulatory Visit (INDEPENDENT_AMBULATORY_CARE_PROVIDER_SITE_OTHER): Payer: Medicare Other | Admitting: Primary Care

## 2022-03-28 ENCOUNTER — Encounter: Payer: Self-pay | Admitting: Primary Care

## 2022-03-28 VITALS — BP 146/82 | HR 80 | Temp 97.9°F | Ht 68.0 in | Wt 209.0 lb

## 2022-03-28 DIAGNOSIS — I10 Essential (primary) hypertension: Secondary | ICD-10-CM | POA: Diagnosis not present

## 2022-03-28 MED ORDER — VALSARTAN-HYDROCHLOROTHIAZIDE 320-25 MG PO TABS: 1.0000 | ORAL_TABLET | Freq: Every day | ORAL | 0 refills | Status: DC

## 2022-03-28 NOTE — Progress Notes (Signed)
Subjective:    Patient ID: Kenneth [name], male    DOB: [birth year], 66 y.o.   MRN: [id number]  Hypertension Pertinent negatives include no chest pain, headaches or shortness of breath.    Kenneth [name] is a very pleasant 66 y.o. male with a history of hypertension, type 2 diabetes, OSA, tobacco use, lower extremity edema who presents today for follow up of hypertension.  He was last evaluated on 03/07/22 for follow up of chronic conditions. His BP was noted to be above goal, also with home readings, despite management on atenolol 50 mg daily, lisinopril 40 mg daily, HCTZ 25 mg daily. During this visit we discontinued his atenolol 50 mg, lisinopril 40 mg, and HCTZ 25 mg and started valsartan-HCTZ 160-25 mg. He is here for follow up today.  Since his last visit he is compliant to his valsartan-HCTZ 160-25 mg daily. He is checking his BP at [employer] on occasion, last check was 2 days ago which was 140's/90's.   BP Readings from Last 3 Encounters:  03/28/22 (!) 146/82  03/07/22 (!) 152/80  09/11/21 138/69     Review of Systems  Respiratory:  Negative for shortness of breath.   Cardiovascular:  Negative for chest pain.  Neurological:  Negative for dizziness and headaches.         Past Medical History:  Diagnosis Date   Anxiety and depression    Bipolar 1 disorder ([place])    Blood transfusion without reported diagnosis    Chronic kidney disease    Depression    GERD (gastroesophageal reflux disease)    Hypercholesterolemia    Hypertension    Sleep apnea    Type 2 diabetes mellitus ([place])     Social History   Socioeconomic History   Marital status: Divorced    Spouse name: Not on file   Number of children: Not on file   Years of education: Not on file   Highest education level: Not on file  Occupational History   Not on file  Tobacco Use   Smoking status: Every Day    Packs/day: 1.00    Years: 41.00    Total pack years: 41.00    Types: Cigarettes   Smokeless  tobacco: Never  Vaping Use   Vaping Use: Never used  Substance and Sexual Activity   Alcohol use: No   Drug use: No   Sexual activity: Not on file  Other Topics Concern   Not on file  Social History Narrative   Single.   1 daughter, 1 grandchild.   Retired. Once worked in [employer].   Enjoys sports, racing, traveling.    Social Determinants of Health   Financial Resource Strain: Low Risk  (05/05/2019)   Overall Financial Resource Strain (CARDIA)    Difficulty of Paying Living Expenses: Not hard at all  Food Insecurity: No Food Insecurity (05/05/2019)   Hunger Vital Sign    Worried About Running Out of Food in the Last Year: Never true    Ran Out of Food in the Last Year: Never true  Transportation Needs: No Transportation Needs (05/05/2019)   PRAPARE - Hydrologist (Medical): No    Lack of Transportation (Non-Medical): No  Physical Activity: Inactive (05/05/2019)   Exercise Vital Sign    Days of Exercise per Week: 0 days    Minutes of Exercise per Session: 0 min  Stress: No Stress Concern Present (05/05/2019)   [place]  Questionnaire    Feeling of Stress : Not at all  Social Connections: Not on file  Intimate Partner Violence: Not At Risk (05/05/2019)   Humiliation, Afraid, Rape, and Kick questionnaire    Fear of Current or Ex-Partner: No    Emotionally Abused: No    Physically Abused: No    Sexually Abused: No    Past Surgical History:  Procedure Laterality Date   L hand surgery     R knee arthoroscopy     SPLENECTOMY      Family History  Problem Relation Age of Onset   Hypertension Mother    Mental illness Mother    Diabetes Mother    Hypertension Father    Lung cancer Father    Diabetes Paternal Grandmother    Colon cancer Maternal Uncle    Esophageal cancer Neg Hx    Liver cancer Neg Hx    Pancreatic cancer Neg Hx    Rectal cancer Neg Hx    Stomach cancer Neg Hx     No Known  Allergies  Current Outpatient Medications on File Prior to Visit  Medication Sig Dispense Refill   Accu-Chek FastClix Lancets MISC USE TO CHECK BLOOD SUGAR UP TO 4 TIMES A DAY 200 each 3   APPLE CIDER VINEGAR PO Take by mouth.     Ascorbic Acid (VITAMIN C) 1000 MG tablet Take 1,000 mg by mouth daily.     atorvastatin (LIPITOR) 20 MG tablet Take 1 tablet (20 mg total) by mouth daily. for cholesterol. 90 tablet 3   Biotin 10 MG CAPS Take by mouth.     blood glucose meter kit and supplies KIT Dispense based on patient and insurance preference. Use up to four times daily as directed. (FOR ICD-9 250.00, 250.01). 1 each 0   COCONUT OIL PO Take by mouth daily.     Cyanocobalamin (VITAMIN B-12 PO) Take 1 tablet by mouth daily.     diclofenac (VOLTAREN) 75 MG EC tablet Take 1 tablet (75 mg total) by mouth daily as needed for moderate pain. 90 tablet 0   fluticasone (FLONASE) 50 MCG/ACT nasal spray ONE SPRAY IN EACH NOSTRIL TWICE DAILY AS NEEDED FOR ALLERGIES OR RHINITIS 48 g 0   GARLIC PO Take 1 tablet by mouth daily.      Ginger, Zingiber officinalis, (GINGER PO) Take by mouth.     glucose blood (ACCU-CHEK GUIDE) test strip USE TO CHECK UP TO 4 TIMES A DAY 200 strip 3   magnesium oxide (MAG-OX) 400 MG tablet Take 400 mg by mouth daily.     methocarbamol (ROBAXIN) 500 MG tablet Take 1 tablet (500 mg total) by mouth daily as needed for muscle spasms. 30 tablet 0   Multiple Vitamins-Minerals (MULTIVITAMIN ADULT) TABS Take 1 tablet by mouth daily.     ofloxacin (OCUFLOX) 0.3 % ophthalmic solution Place 1 drop into the right eye 4 (four) times daily. 5 mL 0   Omega-3 Fatty Acids (OMEGA 3 PO) Take 1 capsule by mouth daily.     sertraline (ZOLOFT) 50 MG tablet TAKE ONE TABLET EACH DAY FOR ANXIETY AND DEPRESSION 90 tablet 3   No current facility-administered medications on file prior to visit.    BP (!) 146/82   Pulse 80   Temp 97.9 F (36.6 C) (Temporal)   Ht _0  (1.727 m)   Wt 209 lb (94.8 kg)    SpO2 96%   BMI 31.78 kg/m  Objective:   Physical Exam Cardiovascular:  Rate and Rhythm: Normal rate and regular rhythm.  Pulmonary:     Effort: Pulmonary effort is normal.     Breath sounds: Normal breath sounds. No wheezing or rales.  Musculoskeletal:     Cervical back: Neck supple.  Skin:    General: Skin is warm and dry.  Neurological:     Mental Status: He is alert and oriented to person, place, and time.           Assessment & Plan:   Problem List Items Addressed This Visit       Cardiovascular and Mediastinum   Hypertension - Primary    Slightly improved but still above goal.  Increase valsartan-HCTZ to 320-25 mg daily. New Rx sent to pharmacy.  We will see him back in 2-3 weeks for follow up.       Relevant Medications   valsartan-hydrochlorothiazide (DIOVAN-HCT) 320-25 MG tablet       Pleas Koch, NP

## 2022-03-28 NOTE — Assessment & Plan Note (Signed)
Slightly improved but still above goal.  Increase valsartan-HCTZ to 320-25 mg daily. New Rx sent to pharmacy.  We will see him back in 2-3 weeks for follow up.

## 2022-03-28 NOTE — Patient Instructions (Signed)
We increased your valsartan-hydrochlorothiazide to 320-25 mg. I sent a new prescription to your pharmacy.  Please schedule a follow up visit to meet back with me in 2-3 weeks for blood pressure check.   It was a pleasure to see you today!

## 2022-04-09 ENCOUNTER — Telehealth: Payer: Self-pay | Admitting: Primary Care

## 2022-04-09 DIAGNOSIS — M48062 Spinal stenosis, lumbar region with neurogenic claudication: Secondary | ICD-10-CM | POA: Diagnosis not present

## 2022-04-09 NOTE — Telephone Encounter (Signed)
LVM for pt to rtn my call to schedule AWV with [facility] call back # [phone] 

## 2022-04-10 DIAGNOSIS — L988 Other specified disorders of the skin and subcutaneous tissue: Secondary | ICD-10-CM | POA: Diagnosis not present

## 2022-04-10 DIAGNOSIS — D485 Neoplasm of uncertain behavior of skin: Secondary | ICD-10-CM | POA: Diagnosis not present

## 2022-04-18 ENCOUNTER — Ambulatory Visit (INDEPENDENT_AMBULATORY_CARE_PROVIDER_SITE_OTHER): Payer: Medicare Other | Admitting: Primary Care

## 2022-04-18 ENCOUNTER — Encounter: Payer: Self-pay | Admitting: Primary Care

## 2022-04-18 VITALS — BP 140/70 | HR 94 | Temp 97.3°F | Ht 68.0 in | Wt 213.0 lb

## 2022-04-18 DIAGNOSIS — I10 Essential (primary) hypertension: Secondary | ICD-10-CM | POA: Diagnosis not present

## 2022-04-18 MED ORDER — AMLODIPINE BESYLATE 5 MG PO TABS: 5.0000 mg | ORAL_TABLET | Freq: Every day | ORAL | 0 refills | Status: DC

## 2022-04-18 NOTE — Progress Notes (Signed)
Subjective:    Patient ID: Kenneth [name], male    DOB: [birth year], 67 y.o.   MRN: [id number]  HPI  Kenneth [name] is a very pleasant 67 y.o. male  has a past medical history of Anxiety and depression, Bipolar 1 disorder ([place]), Blood transfusion without reported diagnosis, Chronic kidney disease, Depression, GERD (gastroesophageal reflux disease), Hypercholesterolemia, Hypertension, Sleep apnea, and Type 2 diabetes mellitus ([place]). who presents today for follow up of hypertension.  He was last evaluated on 03/28/22 for follow up of hypertension. During this visit his BP was above goal on valsartan 160-25 mg so we increased his dose to 320-25 mg. Her is here for follow up today.  Since his last visit he is compliant to his valsartan 320-25 mg.   BP Readings from Last 3 Encounters:  04/18/22 (!) 140/70  03/28/22 (!) 146/82  03/07/22 (!) 152/80   He has not checked his BP at home, but he has checked his BP at [employer] a few times which runs 140's-150's/80's mostly.   He denies dizziness, headaches, chest pain.  Review of Systems  Respiratory:  Negative for shortness of breath.   Cardiovascular:  Negative for chest pain.  Neurological:  Negative for dizziness and headaches.         Past Medical History:  Diagnosis Date   Anxiety and depression    Bipolar 1 disorder ([place])    Blood transfusion without reported diagnosis    Chronic kidney disease    Depression    GERD (gastroesophageal reflux disease)    Hypercholesterolemia    Hypertension    Sleep apnea    Type 2 diabetes mellitus ([place])     Social History   Socioeconomic History   Marital status: Divorced    Spouse name: Not on file   Number of children: Not on file   Years of education: Not on file   Highest education level: Not on file  Occupational History   Not on file  Tobacco Use   Smoking status: Every Day    Packs/day: 1.00    Years: 41.00    Total pack years: 41.00    Types: Cigarettes   Smokeless  tobacco: Never  Vaping Use   Vaping Use: Never used  Substance and Sexual Activity   Alcohol use: No   Drug use: No   Sexual activity: Not on file  Other Topics Concern   Not on file  Social History Narrative   Single.   1 daughter, 1 grandchild.   Retired. Once worked in [employer].   Enjoys sports, racing, traveling.    Social Determinants of Health   Financial Resource Strain: Low Risk  (05/05/2019)   Overall Financial Resource Strain (CARDIA)    Difficulty of Paying Living Expenses: Not hard at all  Food Insecurity: No Food Insecurity (05/05/2019)   Hunger Vital Sign    Worried About Running Out of Food in the Last Year: Never true    Ran Out of Food in the Last Year: Never true  Transportation Needs: No Transportation Needs (05/05/2019)   PRAPARE - Hydrologist (Medical): No    Lack of Transportation (Non-Medical): No  Physical Activity: Inactive (05/05/2019)   Exercise Vital Sign    Days of Exercise per Week: 0 days    Minutes of Exercise per Session: 0 min  Stress: No Stress Concern Present (05/05/2019)   [place]    Feeling of Stress :  Not at all  Social Connections: Not on file  Intimate Partner Violence: Not At Risk (05/05/2019)   Humiliation, Afraid, Rape, and Kick questionnaire    Fear of Current or Ex-Partner: No    Emotionally Abused: No    Physically Abused: No    Sexually Abused: No    Past Surgical History:  Procedure Laterality Date   L hand surgery     R knee arthoroscopy     SPLENECTOMY      Family History  Problem Relation Age of Onset   Hypertension Mother    Mental illness Mother    Diabetes Mother    Hypertension Father    Lung cancer Father    Diabetes Paternal Grandmother    Colon cancer Maternal Uncle    Esophageal cancer Neg Hx    Liver cancer Neg Hx    Pancreatic cancer Neg Hx    Rectal cancer Neg Hx    Stomach cancer Neg Hx     No Known  Allergies  Current Outpatient Medications on File Prior to Visit  Medication Sig Dispense Refill   Accu-Chek FastClix Lancets MISC USE TO CHECK BLOOD SUGAR UP TO 4 TIMES A DAY 200 each 3   APPLE CIDER VINEGAR PO Take by mouth.     Ascorbic Acid (VITAMIN C) 1000 MG tablet Take 1,000 mg by mouth daily.     atorvastatin (LIPITOR) 20 MG tablet Take 1 tablet (20 mg total) by mouth daily. for cholesterol. 90 tablet 3   Biotin 10 MG CAPS Take by mouth.     blood glucose meter kit and supplies KIT Dispense based on patient and insurance preference. Use up to four times daily as directed. (FOR ICD-9 250.00, 250.01). 1 each 0   COCONUT OIL PO Take by mouth daily.     Cyanocobalamin (VITAMIN B-12 PO) Take 1 tablet by mouth daily.     diclofenac (VOLTAREN) 75 MG EC tablet Take 1 tablet (75 mg total) by mouth daily as needed for moderate pain. 90 tablet 0   fluticasone (FLONASE) 50 MCG/ACT nasal spray ONE SPRAY IN EACH NOSTRIL TWICE DAILY AS NEEDED FOR ALLERGIES OR RHINITIS 48 g 0   GARLIC PO Take 1 tablet by mouth daily.      Ginger, Zingiber officinalis, (GINGER PO) Take by mouth.     glucose blood (ACCU-CHEK GUIDE) test strip USE TO CHECK UP TO 4 TIMES A DAY 200 strip 3   magnesium oxide (MAG-OX) 400 MG tablet Take 400 mg by mouth daily.     methocarbamol (ROBAXIN) 500 MG tablet Take 1 tablet (500 mg total) by mouth daily as needed for muscle spasms. 30 tablet 0   Multiple Vitamins-Minerals (MULTIVITAMIN ADULT) TABS Take 1 tablet by mouth daily.     ofloxacin (OCUFLOX) 0.3 % ophthalmic solution Place 1 drop into the right eye 4 (four) times daily. 5 mL 0   Omega-3 Fatty Acids (OMEGA 3 PO) Take 1 capsule by mouth daily.     sertraline (ZOLOFT) 50 MG tablet TAKE ONE TABLET EACH DAY FOR ANXIETY AND DEPRESSION 90 tablet 3   valsartan-hydrochlorothiazide (DIOVAN-HCT) 320-25 MG tablet Take 1 tablet by mouth daily. for blood pressure. 30 tablet 0   No current facility-administered medications on file prior  to visit.    BP (!) 140/70   Pulse 94   Temp (!) 97.3 F (36.3 C) (Temporal)   Ht '5\' 8"'$  (1.727 m)   Wt 213 lb (96.6 kg)   SpO2 95%  BMI 32.39 kg/m  Objective:   Physical Exam Cardiovascular:     Rate and Rhythm: Normal rate and regular rhythm.  Pulmonary:     Effort: Pulmonary effort is normal.     Breath sounds: Normal breath sounds. No wheezing or rales.  Musculoskeletal:     Cervical back: Neck supple.  Skin:    General: Skin is warm and dry.  Neurological:     Mental Status: He is alert and oriented to person, place, and time.           Assessment & Plan:  Primary hypertension Assessment & Plan: Improved but not at goal.  Continue valsartan-HCTZ 320-25 mg daily. Add amlodipine 5 mg daily.  BMP pending today.  We will see him back in March for follow up. He will continue to monitor BP and update if no improvement.  Orders: -     Basic metabolic panel -     amLODIPine Besylate; Take 1 tablet (5 mg total) by mouth daily. for blood pressure.  Dispense: 90 tablet; Refill: 0        Pleas Koch, NP

## 2022-04-18 NOTE — Assessment & Plan Note (Signed)
Improved but not at goal.  Continue valsartan-HCTZ 320-25 mg daily. Add amlodipine 5 mg daily.  BMP pending today.  We will see him back in March for follow up. He will continue to monitor BP and update if no improvement.

## 2022-04-18 NOTE — Patient Instructions (Signed)
Continue taking valsartan-hydrochlorothiazide 320-25 mg for blood pressure.   Start amlodipine 5 mg daily for blood pressure.  Stop by the lab prior to leaving today. I will notify you of your results once received.   We will see you in March for follow up.  It was a pleasure to see you today!

## 2022-04-19 ENCOUNTER — Telehealth: Payer: Self-pay | Admitting: Primary Care

## 2022-04-19 ENCOUNTER — Encounter: Payer: Self-pay | Admitting: Primary Care

## 2022-04-19 LAB — BASIC METABOLIC PANEL
BUN: 10 mg/dL (ref 6–23)
CO2: 28 mEq/L (ref 19–32)
Calcium: 9.1 mg/dL (ref 8.4–10.5)
Chloride: 102 mEq/L (ref 96–112)
Creatinine, Ser: 0.86 mg/dL (ref 0.40–1.50)
GFR: 90.34 mL/min (ref >60.00)
Glucose, Bld: 95 mg/dL (ref 70–99)
Potassium: 4.1 mEq/L (ref 3.5–5.1)
Sodium: 141 mEq/L (ref 135–145)

## 2022-04-19 NOTE — Telephone Encounter (Signed)
Unable to reach patient. Left voicemail to return call to our office.   

## 2022-04-19 NOTE — Telephone Encounter (Signed)
Called and notified patient, per review of office visit notes from 04/18/22 he is to continue the valsartan-HCTZ and add on amlodipine. He verbalized understanding.

## 2022-04-19 NOTE — Telephone Encounter (Signed)
Patient called in and was wanting to know if he should still be taking his old blood pressure with the new one or should he just be taking one. Please advise. Thank you!

## 2022-04-26 ENCOUNTER — Other Ambulatory Visit: Payer: Self-pay | Admitting: Primary Care

## 2022-04-26 DIAGNOSIS — I10 Essential (primary) hypertension: Secondary | ICD-10-CM

## 2022-05-08 ENCOUNTER — Telehealth: Payer: Self-pay | Admitting: Primary Care

## 2022-05-08 DIAGNOSIS — G8929 Other chronic pain: Secondary | ICD-10-CM

## 2022-05-08 MED ORDER — METHOCARBAMOL 500 MG PO TABS: 500.0000 mg | ORAL_TABLET | Freq: Every day | ORAL | 0 refills | Status: DC | PRN

## 2022-05-08 NOTE — Addendum Note (Signed)
Addended by: Pleas Koch on: 05/08/2022 04:35 PM   Modules accepted: Orders

## 2022-05-08 NOTE — Telephone Encounter (Signed)
Refills sent to pharmacy. 

## 2022-05-08 NOTE — Telephone Encounter (Signed)
Prescription Request  05/08/2022  Is this a "Controlled Substance" medicine? No  LOV: 04/18/2022  What is the name of the medication or equipment? methocarbamol (ROBAXIN) 500 MG tablet   Have you contacted your pharmacy to request a refill? No   Which pharmacy would you like this sent to?  [place], [place] - [address] [address] Phone: [phone] Fax: [phone]    Patient notified that their request is being sent to the clinical staff for review and that they should receive a response within 2 business days.   Please advise at Mobile [phone] (mobile)

## 2022-05-21 ENCOUNTER — Ambulatory Visit (INDEPENDENT_AMBULATORY_CARE_PROVIDER_SITE_OTHER): Payer: Medicare Other

## 2022-05-21 VITALS — Ht 68.0 in | Wt 213.0 lb

## 2022-05-21 DIAGNOSIS — Z0000 Encounter for general adult medical examination without abnormal findings: Secondary | ICD-10-CM

## 2022-05-21 NOTE — Progress Notes (Signed)
I connected with  Sharmaine Base on 05/21/22 by a audio enabled telemedicine application and verified that I am speaking with the correct person using two identifiers.  Patient Location: Home  Provider Location: Home Office  I discussed the limitations of evaluation and management by telemedicine. The patient expressed understanding and agreed to proceed.  Subjective:   Kenneth [name] is a 67 y.o. male who presents for Medicare Annual/Subsequent preventive examination.  Review of Systems     Cardiac Risk Factors include: advanced age (>1mn, >>15women);hypertension;diabetes mellitus;dyslipidemia     Objective:    Today's Vitals   05/21/22 1252  Weight: 213 lb (96.6 kg)  Height: 5' 8"$  (1.727 m)   Body mass index is 32.39 kg/m.     05/21/2022    1:11 PM 07/11/2020   11:09 PM 05/05/2019   10:40 AM 11/04/2018    4:39 AM 04/30/2018    2:20 PM 01/19/2018    2:34 AM 04/15/2017   11:32 AM  Advanced Directives  Does Patient Have a Medical Advance Directive? No No No No No No No  Would patient like information on creating a medical advance directive? No - Patient declined No - Patient declined No - Patient declined  No - Patient declined No - Patient declined Yes (MAU/Ambulatory/Procedural Areas - Information given)    Current Medications (verified) Outpatient Encounter Medications as of 05/21/2022  Medication Sig   Accu-Chek FastClix Lancets MISC USE TO CHECK BLOOD SUGAR UP TO 4 TIMES A DAY   amLODipine (NORVASC) 5 MG tablet Take 1 tablet (5 mg total) by mouth daily. for blood pressure.   APPLE CIDER VINEGAR PO Take by mouth.   Ascorbic Acid (VITAMIN C) 1000 MG tablet Take 1,000 mg by mouth daily.   atorvastatin (LIPITOR) 20 MG tablet Take 1 tablet (20 mg total) by mouth daily. for cholesterol.   COCONUT OIL PO Take by mouth daily.   Cyanocobalamin (VITAMIN B-12 PO) Take 1 tablet by mouth daily.   diclofenac (VOLTAREN) 75 MG EC tablet Take 1 tablet (75 mg total) by mouth daily as  needed for moderate pain.   fluticasone (FLONASE) 50 MCG/ACT nasal spray ONE SPRAY IN EACH NOSTRIL TWICE DAILY AS NEEDED FOR ALLERGIES OR RHINITIS   GARLIC PO Take 1 tablet by mouth daily.    Ginger, Zingiber officinalis, (GINGER PO) Take by mouth.   magnesium oxide (MAG-OX) 400 MG tablet Take 400 mg by mouth daily.   methocarbamol (ROBAXIN) 500 MG tablet Take 1 tablet (500 mg total) by mouth daily as needed for muscle spasms.   Multiple Vitamins-Minerals (MULTIVITAMIN ADULT) TABS Take 1 tablet by mouth daily.   ofloxacin (OCUFLOX) 0.3 % ophthalmic solution Place 1 drop into the right eye 4 (four) times daily.   Omega-3 Fatty Acids (OMEGA 3 PO) Take 1 capsule by mouth daily.   sertraline (ZOLOFT) 50 MG tablet TAKE ONE TABLET EACH DAY FOR ANXIETY AND DEPRESSION   valsartan-hydrochlorothiazide (DIOVAN-HCT) 320-25 MG tablet TAKE ONE TABLET DAILY FOR FOR BLOOD PRESSURE   Biotin 10 MG CAPS Take by mouth. (Patient not taking: Reported on 05/21/2022)   blood glucose meter kit and supplies KIT Dispense based on patient and insurance preference. Use up to four times daily as directed. (FOR ICD-9 250.00, 250.01).   glucose blood (ACCU-CHEK GUIDE) test strip USE TO CHECK UP TO 4 TIMES A DAY   No facility-administered encounter medications on file as of 05/21/2022.    Allergies (verified) Patient has no known allergies.  History: Past Medical History:  Diagnosis Date   Anxiety and depression    Bipolar 1 disorder ([place])    Blood transfusion without reported diagnosis    Chronic kidney disease    Depression    GERD (gastroesophageal reflux disease)    Hypercholesterolemia    Hypertension    Melanoma ([place])    right flank   Sleep apnea    Type 2 diabetes mellitus ([place])    Past Surgical History:  Procedure Laterality Date   L hand surgery     MELANOMA EXCISION  09/21/2021   R knee arthoroscopy     SPLENECTOMY     Family History  Problem Relation Age of Onset   Hypertension Mother     Mental illness Mother    Diabetes Mother    Hypertension Father    Lung cancer Father    Diabetes Paternal Grandmother    Colon cancer Maternal Uncle    Esophageal cancer Neg Hx    Liver cancer Neg Hx    Pancreatic cancer Neg Hx    Rectal cancer Neg Hx    Stomach cancer Neg Hx    Social History   Socioeconomic History   Marital status: Divorced    Spouse name: Not on file   Number of children: Not on file   Years of education: Not on file   Highest education level: Not on file  Occupational History   Not on file  Tobacco Use   Smoking status: Every Day    Packs/day: 1.00    Years: 41.00    Total pack years: 41.00    Types: Cigarettes   Smokeless tobacco: Never  Vaping Use   Vaping Use: Never used  Substance and Sexual Activity   Alcohol use: No   Drug use: No   Sexual activity: Not on file  Other Topics Concern   Not on file  Social History Narrative   Single.   1 daughter, 1 grandchild.   Retired. Once worked in [employer].   Enjoys sports, racing, traveling.    Social Determinants of Health   Financial Resource Strain: Low Risk  (05/21/2022)   Overall Financial Resource Strain (CARDIA)    Difficulty of Paying Living Expenses: Not hard at all  Food Insecurity: No Food Insecurity (05/21/2022)   Hunger Vital Sign    Worried About Running Out of Food in the Last Year: Never true    Ran Out of Food in the Last Year: Never true  Transportation Needs: No Transportation Needs (05/21/2022)   PRAPARE - Hydrologist (Medical): No    Lack of Transportation (Non-Medical): No  Physical Activity: Sufficiently Active (05/21/2022)   Exercise Vital Sign    Days of Exercise per Week: 7 days    Minutes of Exercise per Session: 60 min  Stress: No Stress Concern Present (05/21/2022)   [place]    Feeling of Stress : Not at all  Social Connections: Socially Isolated (05/21/2022)   Social  Connection and Isolation Panel [NHANES]    Frequency of Communication with Friends and Family: Never    Frequency of Social Gatherings with Friends and Family: Never    Attends Religious Services: More than 4 times per year    Active Member of [employer] or Organizations: No    Attends Archivist Meetings: Never    Marital Status: Divorced    Tobacco Counseling Ready to quit: No Counseling given: No  Clinical Intake:  Pre-visit preparation completed: Yes  Pain : No/denies pain     Nutritional Risks: None Diabetes: Yes CBG done?: No Did pt. bring in CBG monitor from home?: No  How often do you need to have someone help you when you read instructions, pamphlets, or other written materials from your doctor or pharmacy?: 1 - Never  Diabetic? Nutrition Risk Assessment:  Has the patient had any N/V/D within the last 2 months?  No  Does the patient have any non-healing wounds?  No  Has the patient had any unintentional weight loss or weight gain?  No   Diabetes:  Is the patient diabetic?  Yes  If diabetic, was a CBG obtained today?  No  Did the patient bring in their glucometer from home?  No  How often do you monitor your CBG's? Once weekly.   Financial Strains and Diabetes Management:  Are you having any financial strains with the device, your supplies or your medication? No .  Does the patient want to be seen by Chronic Care Management for management of their diabetes?  No  Would the patient like to be referred to a Nutritionist or for Diabetic Management?  No   Diabetic Exams:  Diabetic Eye Exam: Completed 06/19/2021 Dr.Adkins next exam scheduled for March  2024 Diabetic Foot Exam: Completed 03/07/2022 PCP    Interpreter Needed?: No  Information entered by :: C.Alishba Naples LPN   Activities of Daily Living    05/21/2022    1:13 PM  In your present state of health, do you have any difficulty performing the following activities:  Hearing? 0  Vision? 0   Difficulty concentrating or making decisions? 0  Walking or climbing stairs? 0  Dressing or bathing? 0  Doing errands, shopping? 0  Preparing Food and eating ? N  Using the Toilet? N  In the past six months, have you accidently leaked urine? N  Do you have problems with loss of bowel control? N  Managing your Medications? N  Managing your Finances? N  Housekeeping or managing your Housekeeping? N    Patient Care Team: Pleas Koch, NP as PCP - General (Internal Medicine) Unk Pinto, MD as Attending Physician (Internal Medicine) Charlton Haws, [facility] as Pharmacist (Pharmacist)  Indicate any recent Medical Services you may have received from other than [facility] providers in the past year (date may be approximate).     Assessment:   This is a routine wellness examination for [place].  Hearing/Vision screen Hearing Screening - Comments:: No aids Vision Screening - Comments:: [facility]  Dietary issues and exercise activities discussed: Current Exercise Habits: Home exercise routine, Type of exercise: walking;Other - see comments (Bikes), Time (Minutes): 60, Frequency (Times/Week): 7, Weekly Exercise (Minutes/Week): 420, Intensity: Mild, Exercise limited by: None identified   Goals Addressed             This Visit's Progress    patient       Wants to drive to [place].        Depression Screen    05/21/2022    1:06 PM 04/18/2022    2:44 PM 05/05/2019   10:43 AM 04/30/2018   12:36 PM 04/15/2017   11:30 AM  PHQ 2/9 Scores  PHQ - 2 Score 0 0 0 0 0  PHQ- 9 Score 0 0 0 0 3    Fall Risk    05/21/2022   12:56 PM 04/18/2022    2:43 PM 06/06/2021  2:32 PM 05/05/2019   10:42 AM 04/30/2018   12:36 PM  Fall Risk   Falls in the past year? 1 1 0 1 0  Comment    tripped and fell   Number falls in past yr: 0 0 0 1   Injury with Fall? 0 0 0 0   Risk for fall due to : No Fall Risks History of fall(s)  Medication side effect   Follow up Falls  prevention discussed;Falls evaluation completed;Education provided Falls evaluation completed  Falls evaluation completed;Falls prevention discussed     FALL RISK PREVENTION PERTAINING TO THE HOME:  Any stairs in or around the home? Yes  If so, are there any without handrails? Yes  Home free of loose throw rugs in walkways, pet beds, electrical cords, etc? Yes  Adequate lighting in your home to reduce risk of falls? Yes   ASSISTIVE DEVICES UTILIZED TO PREVENT FALLS:  Life alert? No  Use of a cane, walker or w/c? No  Grab bars in the bathroom? No  Shower chair or bench in shower? No  Elevated toilet seat or a handicapped toilet? No    Cognitive Function:    05/05/2019   10:47 AM 04/30/2018   12:37 PM 04/15/2017   11:07 AM  MMSE - Mini Mental State Exam  Orientation to time 5 5 5  $ Orientation to Place 5 5 5  $ Registration 3 3 3  $ Attention/ Calculation 5 0 0  Recall 3 3 3  $ Language- name 2 objects  0 0  Language- repeat 1 1 1  $ Language- follow 3 step command  3 3  Language- read & follow direction  0 0  Write a sentence  0 0  Copy design  0 0  Total score  20 20        05/21/2022    1:21 PM  6CIT Screen  What Year? 0 points  What month? 0 points  What time? 0 points  Count back from 20 0 points  Months in reverse 0 points  Repeat phrase 0 points  Total Score 0 points    Immunizations Immunization History  Administered Date(s) Administered   Fluad Quad(high Dose 65+) 03/07/2022   Influenza,inj,Quad PF,6+ Mos 04/24/2017, 02/04/2018, 12/07/2020   Janssen (J&J) SARS-COV-2 Vaccination 12/01/2019   Moderna SARS-COV2 Booster Vaccination 04/11/2020   Pneumococcal Conjugate-13 01/12/2015   Pneumococcal Polysaccharide-23 05/27/2018   Tdap 10/08/2013   Zoster Recombinat (Shingrix) 05/15/2019, 05/16/2021    TDAP status: Up to date  Flu Vaccine status: Up to date  Pneumococcal vaccine status: Up to date  Covid-19 vaccine status: Declined, Education has been provided  regarding the importance of this vaccine but patient still declined. Advised may receive this vaccine at local pharmacy or Health Dept.or vaccine clinic. Aware to provide a copy of the vaccination record if obtained from local pharmacy or Health Dept. Verbalized acceptance and understanding.  Qualifies for Shingles Vaccine? No   Zostavax completed Yes   Shingrix Completed?: Yes  Screening Tests Health Maintenance  Topic Date Due   Meningococcal B Vaccine (1 of 4 - Increased Risk) Never done   Lung Cancer Screening  09/09/2011   COVID-19 Vaccine (2 - Janssen risk series) 05/09/2020   OPHTHALMOLOGY EXAM  06/20/2022   HEMOGLOBIN A1C  09/06/2022   Diabetic kidney evaluation - Urine ACR  03/08/2023   FOOT EXAM  03/08/2023   Diabetic kidney evaluation - eGFR measurement  04/19/2023   Medicare Annual Wellness (AWV)  05/22/2023   Pneumonia Vaccine  48+ Years old (3 of 3 - PPSV23 or PCV20) 05/28/2023   DTaP/Tdap/Td (2 - Td or Tdap) 10/09/2023   COLONOSCOPY (Pts 45-72yr Insurance coverage will need to be confirmed)  06/24/2027   INFLUENZA VACCINE  Completed   Hepatitis C Screening  Completed   Zoster Vaccines- Shingrix  Completed   HPV VACCINES  Aged Out    Health Maintenance  Health Maintenance Due  Topic Date Due   Meningococcal B Vaccine (1 of 4 - Increased Risk) Never done   Lung Cancer Screening  09/09/2011   COVID-19 Vaccine (2 - Janssen risk series) 05/09/2020    Colorectal cancer screening: Type of screening: Colonoscopy. Completed 06/23/17. Repeat every 10 years  Lung Cancer Screening: (Low Dose CT Chest recommended if Age 67-80years, 30 pack-year currently smoking OR have quit w/in 15years.) does qualify.   Lung Cancer Screening Referral: active referral on file  Additional Screening:  Hepatitis C Screening: does not qualify; Completed 04/15/17  Vision Screening: Recommended annual ophthalmology exams for early detection of glaucoma and other disorders of the eye. Is  the patient up to date with their annual eye exam?  Yes  Who is the provider or what is the name of the office in which the patient attends annual eye exams? Dr.Adkins If pt is not established with a provider, would they like to be referred to a provider to establish care? No .   Dental Screening: Recommended annual dental exams for proper oral hygiene  Community Resource Referral / Chronic Care Management: CRR required this visit?  No   CCM required this visit?  No      Plan:     I have personally reviewed and noted the following in the patient's chart:   Medical and social history Use of alcohol, tobacco or illicit drugs  Current medications and supplements including opioid prescriptions. Patient is not currently taking opioid prescriptions. Functional ability and status Nutritional status Physical activity Advanced directives List of other physicians Hospitalizations, surgeries, and ER visits in previous 12 months Vitals Screenings to include cognitive, depression, and falls Referrals and appointments  In addition, I have reviewed and discussed with patient certain preventive protocols, quality metrics, and best practice recommendations. A written personalized care plan for preventive services as well as general preventive health recommendations were provided to patient.     CLebron Conners LPN   [id number]  Nurse Notes: none

## 2022-05-21 NOTE — Patient Instructions (Addendum)
Kenneth [name] , Thank you for taking time to come for your Medicare Wellness Visit. I appreciate your ongoing commitment to your health goals. Please review the following plan we discussed and let me know if I can assist you in the future.   These are the goals we discussed:  Goals      Follow up with Primary Care Provider     Starting 04/30/2018, I will continue to take medications as prescribed and to keep appointments with PCP as scheduled.      patient     Wants to drive to [place].      Patient Stated     05/05/2019, I will maintain and continue medications as prescribed.      Track and Manage My Blood Pressure-Hypertension     Timeframe:  Long-Range Goal Priority:  High Start Date:       04/09/21                      Expected End Date:   10/07/21                    Follow Up Date Feb 2023   - check blood pressure daily - choose a place to take my blood pressure (home, clinic or office, retail store) - write blood pressure results in a log or diary    Why is this important?   You won't feel high blood pressure, but it can still hurt your blood vessels.  High blood pressure can cause heart or kidney problems. It can also cause a stroke.  Making lifestyle changes like losing a little weight or eating less salt will help.  Checking your blood pressure at home and at different times of the day can help to control blood pressure.  If the doctor prescribes medicine remember to take it the way the doctor ordered.  Call the office if you cannot afford the medicine or if there are questions about it.     Notes:         This is a list of the screening recommended for you and due dates:  Health Maintenance  Topic Date Due   Meningitis B Vaccine (1 of 4 - Increased Risk) Never done   Screening for Lung Cancer  09/09/2011   COVID-19 Vaccine (2 - Janssen risk series) 05/09/2020   Eye exam for diabetics  06/20/2022   Hemoglobin A1C  09/06/2022   Yearly kidney health urinalysis for  diabetes  03/08/2023   Complete foot exam   03/08/2023   Yearly kidney function blood test for diabetes  04/19/2023   Medicare Annual Wellness Visit  05/22/2023   Pneumonia Vaccine (3 of 3 - PPSV23 or PCV20) 05/28/2023   DTaP/Tdap/Td vaccine (2 - Td or Tdap) 10/09/2023   Colon Cancer Screening  06/24/2027   Flu Shot  Completed   Hepatitis C Screening: USPSTF Recommendation to screen - Ages 18-79 yo.  Completed   Zoster (Shingles) Vaccine  Completed   HPV Vaccine  Aged Out    Advanced directives: Advance directive discussed with you today. Even though you declined this today, please call our office should you change your mind, and we can give you the proper paperwork for you to fill out.   Conditions/risks identified: If you wish to quit smoking, help is available. For free tobacco cessation program offerings call the [facility] at (225)664-6946 or Live Well Line at 954-289-9257. You may also visit [email or web address]  or email [email or web address] .[email or web address] for more information on other programs.   You may also call 1-800-QUIT-NOW (816)169-6829) or visit www.[employer].dk or www.[facility]ome[facility]Ex.org for additional resources on smoking cessation.    Next appointment: Follow up in one year for your annual wellness visit. 05/27/2023 @ 9:45 via telephone.  Preventive Care 85 Years and Older, Male  Preventive care refers to lifestyle choices and visits with your health care provider that can promote health and wellness. What does preventive care include? A yearly physical exam. This is also called an annual well check. Dental exams once or twice a year. Routine eye exams. Ask your health care provider how often you should have your eyes checked. Personal lifestyle choices, including: Daily care of your teeth and gums. Regular physical activity. Eating a healthy diet. Avoiding tobacco and drug use. Limiting alcohol use. Practicing safe sex. Taking low doses of aspirin every  day. Taking vitamin and mineral supplements as recommended by your health care provider. What happens during an annual well check? The services and screenings done by your health care provider during your annual well check will depend on your age, overall health, lifestyle risk factors, and family history of disease. Counseling  Your health care provider may ask you questions about your: Alcohol use. Tobacco use. Drug use. Emotional well-being. Home and relationship well-being. Sexual activity. Eating habits. History of falls. Memory and ability to understand (cognition). Work and work Statistician. Screening  You may have the following tests or measurements: Height, weight, and BMI. Blood pressure. Lipid and cholesterol levels. These may be checked every 5 years, or more frequently if you are over 27 years old. Skin check. Lung cancer screening. You may have this screening every year starting at age 53 if you have a 30-pack-year history of smoking and currently smoke or have quit within the past 15 years. Fecal occult blood test (FOBT) of the stool. You may have this test every year starting at age 16. Flexible sigmoidoscopy or colonoscopy. You may have a sigmoidoscopy every 5 years or a colonoscopy every 10 years starting at age 3. Prostate cancer screening. Recommendations will vary depending on your family history and other risks. Hepatitis C blood test. Hepatitis B blood test. Sexually transmitted disease (STD) testing. Diabetes screening. This is done by checking your blood sugar (glucose) after you have not eaten for a while (fasting). You may have this done every 1-3 years. Abdominal aortic aneurysm (AAA) screening. You may need this if you are a current or former smoker. Osteoporosis. You may be screened starting at age 54 if you are at high risk. Talk with your health care provider about your test results, treatment options, and if necessary, the need for more  tests. Vaccines  Your health care provider may recommend certain vaccines, such as: Influenza vaccine. This is recommended every year. Tetanus, diphtheria, and acellular pertussis (Tdap, Td) vaccine. You may need a Td booster every 10 years. Zoster vaccine. You may need this after age 72. Pneumococcal 13-valent conjugate (PCV13) vaccine. One dose is recommended after age 61. Pneumococcal polysaccharide (PPSV23) vaccine. One dose is recommended after age 49. Talk to your health care provider about which screenings and vaccines you need and how often you need them. This information is not intended to replace advice given to you by your health care provider. Make sure you discuss any questions you have with your health care provider. Document Released: 04/14/2015 Document Revised: 12/06/2015 Document Reviewed: 01/17/2015 Elsevier Interactive Patient Education  2017 [place]  in the Home Falls can cause injuries. They can happen to people of all ages. There are many things you can do to make your home safe and to help prevent falls. What can I do on the outside of my home? Regularly fix the edges of walkways and driveways and fix any cracks. Remove anything that might make you trip as you walk through a door, such as a raised step or threshold. Trim any bushes or trees on the path to your home. Use bright outdoor lighting. Clear any walking paths of anything that might make someone trip, such as rocks or tools. Regularly check to see if handrails are loose or broken. Make sure that both sides of any steps have handrails. Any raised decks and porches should have guardrails on the edges. Have any leaves, snow, or ice cleared regularly. Use sand or salt on walking paths during winter. Clean up any spills in your garage right away. This includes oil or grease spills. What can I do in the bathroom? Use night lights. Install grab bars by the toilet and in the tub and shower.  Do not use towel bars as grab bars. Use non-skid mats or decals in the tub or shower. If you need to sit down in the shower, use a plastic, non-slip stool. Keep the floor dry. Clean up any water that spills on the floor as soon as it happens. Remove soap buildup in the tub or shower regularly. Attach bath mats securely with double-sided non-slip rug tape. Do not have throw rugs and other things on the floor that can make you trip. What can I do in the bedroom? Use night lights. Make sure that you have a light by your bed that is easy to reach. Do not use any sheets or blankets that are too big for your bed. They should not hang down onto the floor. Have a firm chair that has side arms. You can use this for support while you get dressed. Do not have throw rugs and other things on the floor that can make you trip. What can I do in the kitchen? Clean up any spills right away. Avoid walking on wet floors. Keep items that you use a lot in easy-to-reach places. If you need to reach something above you, use a strong step stool that has a grab bar. Keep electrical cords out of the way. Do not use floor polish or wax that makes floors slippery. If you must use wax, use non-skid floor wax. Do not have throw rugs and other things on the floor that can make you trip. What can I do with my stairs? Do not leave any items on the stairs. Make sure that there are handrails on both sides of the stairs and use them. Fix handrails that are broken or loose. Make sure that handrails are as long as the stairways. Check any carpeting to make sure that it is firmly attached to the stairs. Fix any carpet that is loose or worn. Avoid having throw rugs at the top or bottom of the stairs. If you do have throw rugs, attach them to the floor with carpet tape. Make sure that you have a light switch at the top of the stairs and the bottom of the stairs. If you do not have them, ask someone to add them for you. What else  can I do to help prevent falls? Wear shoes that: Do not have high heels. Have rubber bottoms. Are comfortable and fit you well.  Are closed at the toe. Do not wear sandals. If you use a stepladder: Make sure that it is fully opened. Do not climb a closed stepladder. Make sure that both sides of the stepladder are locked into place. Ask someone to hold it for you, if possible. Clearly mark and make sure that you can see: Any grab bars or handrails. First and last steps. Where the edge of each step is. Use tools that help you move around (mobility aids) if they are needed. These include: Canes. Walkers. Scooters. Crutches. Turn on the lights when you go into a dark area. Replace any light bulbs as soon as they burn out. Set up your furniture so you have a clear path. Avoid moving your furniture around. If any of your floors are uneven, fix them. If there are any pets around you, be aware of where they are. Review your medicines with your doctor. Some medicines can make you feel dizzy. This can increase your chance of falling. Ask your doctor what other things that you can do to help prevent falls. This information is not intended to replace advice given to you by your health care provider. Make sure you discuss any questions you have with your health care provider. Document Released: 01/12/2009 Document Revised: 08/24/2015 Document Reviewed: 04/22/2014 Elsevier Interactive Patient Education  2017 [employer].

## 2022-05-28 ENCOUNTER — Other Ambulatory Visit: Payer: Self-pay | Admitting: Primary Care

## 2022-05-28 DIAGNOSIS — G8929 Other chronic pain: Secondary | ICD-10-CM

## 2022-06-03 ENCOUNTER — Encounter: Payer: Self-pay | Admitting: Primary Care

## 2022-06-03 ENCOUNTER — Ambulatory Visit (INDEPENDENT_AMBULATORY_CARE_PROVIDER_SITE_OTHER): Payer: Medicare Other | Admitting: Primary Care

## 2022-06-03 VITALS — BP 132/84 | HR 75 | Temp 98.4°F | Ht 68.0 in | Wt 210.0 lb

## 2022-06-03 DIAGNOSIS — I10 Essential (primary) hypertension: Secondary | ICD-10-CM

## 2022-06-03 DIAGNOSIS — E1165 Type 2 diabetes mellitus with hyperglycemia: Secondary | ICD-10-CM

## 2022-06-03 LAB — POCT GLYCOSYLATED HEMOGLOBIN (HGB A1C): Hemoglobin A1C: 6.2 % — AB (ref 4.0–5.6)

## 2022-06-03 NOTE — Assessment & Plan Note (Signed)
Controlled.  Continue valsartan-HCTZ 320-25 mg daily and amlodipine 5 mg daily.

## 2022-06-03 NOTE — Patient Instructions (Signed)
Remain off metformin for now.  Please schedule a physical to meet with me in 6 months.  It was a pleasure to see you today!

## 2022-06-03 NOTE — Progress Notes (Signed)
Subjective:    Patient ID: Kenneth [name], male    DOB: [birth year], 67 y.o.   MRN: [id number]  HPI  Kenneth [name] is a very pleasant 67 y.o. male with a history of hypertension, OSA, type 2 diabetes, hyperlipidemia, tobacco use who presents today for follow-up of diabetes and hypertension.  1) Type 2 Diabetes:  Current medications include: None.  Previously managed on metformin.  He is checking his blood glucose on occasion and is getting readings of:  Before lunch: low 100's  Last A1C: 6.3 in December 2023, 6.2 today Last Eye Exam: Due Last Foot Exam: UTD Pneumonia Vaccination: 2020 Urine Microalbumin: UTD Statin: atorvastatin   Dietary changes since last visit: He's increased consumption of vegetables, and is cooking at home.    Exercise: Active.   2) Essential Hypertension: Currently managed on valsartan-HCTZ 320-25 mg daily and amlodipine 5 mg daily. He denies chest pain, shortness of breath, dizziness.   BP Readings from Last 3 Encounters:  06/03/22 132/84  04/18/22 (!) 140/70  03/28/22 (!) 146/82       Review of Systems  Respiratory:  Negative for shortness of breath.   Cardiovascular:  Negative for chest pain.  Neurological:  Negative for dizziness and numbness.         Past Medical History:  Diagnosis Date   Anxiety and depression    Bipolar 1 disorder ([place])    Blood transfusion without reported diagnosis    Chronic kidney disease    Depression    GERD (gastroesophageal reflux disease)    Hypercholesterolemia    Hypertension    Melanoma ([place])    right flank   Sleep apnea    Type 2 diabetes mellitus ([place])     Social History   Socioeconomic History   Marital status: Divorced    Spouse name: Not on file   Number of children: Not on file   Years of education: Not on file   Highest education level: Not on file  Occupational History   Not on file  Tobacco Use   Smoking status: Every Day    Packs/day: 1.00    Years: 41.00    Total pack  years: 41.00    Types: Cigarettes   Smokeless tobacco: Never  Vaping Use   Vaping Use: Never used  Substance and Sexual Activity   Alcohol use: No   Drug use: No   Sexual activity: Not on file  Other Topics Concern   Not on file  Social History Narrative   Single.   1 daughter, 1 grandchild.   Retired. Once worked in [employer].   Enjoys sports, racing, traveling.    Social Determinants of Health   Financial Resource Strain: Low Risk  (05/21/2022)   Overall Financial Resource Strain (CARDIA)    Difficulty of Paying Living Expenses: Not hard at all  Food Insecurity: No Food Insecurity (05/21/2022)   Hunger Vital Sign    Worried About Running Out of Food in the Last Year: Never true    Ran Out of Food in the Last Year: Never true  Transportation Needs: No Transportation Needs (05/21/2022)   PRAPARE - Hydrologist (Medical): No    Lack of Transportation (Non-Medical): No  Physical Activity: Sufficiently Active (05/21/2022)   Exercise Vital Sign    Days of Exercise per Week: 7 days    Minutes of Exercise per Session: 60 min  Stress: No Stress Concern Present (05/21/2022)   [employer] of Occupational  Health - Occupational Stress Questionnaire    Feeling of Stress : Not at all  Social Connections: Socially Isolated (05/21/2022)   Social Connection and Isolation Panel [NHANES]    Frequency of Communication with Friends and Family: Never    Frequency of Social Gatherings with Friends and Family: Never    Attends Religious Services: More than 4 times per year    Active Member of [employer] or Organizations: No    Attends Archivist Meetings: Never    Marital Status: Divorced  Human resources officer Violence: Not At Risk (05/21/2022)   Humiliation, Afraid, Rape, and Kick questionnaire    Fear of Current or Ex-Partner: No    Emotionally Abused: No    Physically Abused: No    Sexually Abused: No    Past Surgical History:  Procedure Laterality Date   L  hand surgery     MELANOMA EXCISION  09/21/2021   R knee arthoroscopy     SPLENECTOMY      Family History  Problem Relation Age of Onset   Hypertension Mother    Mental illness Mother    Diabetes Mother    Hypertension Father    Lung cancer Father    Diabetes Paternal Grandmother    Colon cancer Maternal Uncle    Esophageal cancer Neg Hx    Liver cancer Neg Hx    Pancreatic cancer Neg Hx    Rectal cancer Neg Hx    Stomach cancer Neg Hx     No Known Allergies  Current Outpatient Medications on File Prior to Visit  Medication Sig Dispense Refill   Accu-Chek FastClix Lancets MISC USE TO CHECK BLOOD SUGAR UP TO 4 TIMES A DAY 200 each 3   amLODipine (NORVASC) 5 MG tablet Take 1 tablet (5 mg total) by mouth daily. for blood pressure. 90 tablet 0   APPLE CIDER VINEGAR PO Take by mouth.     Ascorbic Acid (VITAMIN C) 1000 MG tablet Take 1,000 mg by mouth daily.     atorvastatin (LIPITOR) 20 MG tablet Take 1 tablet (20 mg total) by mouth daily. for cholesterol. 90 tablet 3   blood glucose meter kit and supplies KIT Dispense based on patient and insurance preference. Use up to four times daily as directed. (FOR ICD-9 250.00, 250.01). 1 each 0   COCONUT OIL PO Take by mouth daily.     Cyanocobalamin (VITAMIN B-12 PO) Take 1 tablet by mouth daily.     diclofenac (VOLTAREN) 75 MG EC tablet TAKE ONE TABLET EACH DAY AS NEEDEd for moderate pain 90 tablet 0   fluticasone (FLONASE) 50 MCG/ACT nasal spray ONE SPRAY IN EACH NOSTRIL TWICE DAILY AS NEEDED FOR ALLERGIES OR RHINITIS 48 g 0   GARLIC PO Take 1 tablet by mouth daily.      Ginger, Zingiber officinalis, (GINGER PO) Take by mouth.     glucose blood (ACCU-CHEK GUIDE) test strip USE TO CHECK UP TO 4 TIMES A DAY 200 strip 3   magnesium oxide (MAG-OX) 400 MG tablet Take 400 mg by mouth daily.     methocarbamol (ROBAXIN) 500 MG tablet Take 1 tablet (500 mg total) by mouth daily as needed for muscle spasms. 30 tablet 0   Multiple  Vitamins-Minerals (MULTIVITAMIN ADULT) TABS Take 1 tablet by mouth daily.     ofloxacin (OCUFLOX) 0.3 % ophthalmic solution Place 1 drop into the right eye 4 (four) times daily. 5 mL 0   Omega-3 Fatty Acids (OMEGA 3 PO) Take 1 capsule  by mouth daily.     sertraline (ZOLOFT) 50 MG tablet TAKE ONE TABLET EACH DAY FOR ANXIETY AND DEPRESSION 90 tablet 3   valsartan-hydrochlorothiazide (DIOVAN-HCT) 320-25 MG tablet TAKE ONE TABLET DAILY FOR FOR BLOOD PRESSURE 90 tablet 1   Biotin 10 MG CAPS Take by mouth. (Patient not taking: Reported on 05/21/2022)     No current facility-administered medications on file prior to visit.    BP 132/84   Pulse 75   Temp 98.4 F (36.9 C) (Temporal)   Ht '5\' 8"'$  (1.727 m)   Wt 210 lb (95.3 kg)   SpO2 98%   BMI 31.93 kg/m  Objective:   Physical Exam Cardiovascular:     Rate and Rhythm: Normal rate and regular rhythm.  Pulmonary:     Effort: Pulmonary effort is normal.     Breath sounds: Normal breath sounds. No wheezing or rales.  Musculoskeletal:     Cervical back: Neck supple.  Skin:    General: Skin is warm and dry.  Neurological:     Mental Status: He is alert and oriented to person, place, and time.           Assessment & Plan:  Type 2 diabetes mellitus with hyperglycemia, without long-term current use of insulin (HCC) Assessment & Plan: Controlled with A1C today of 6.2!  Remain off metformin for now. Continue to work on lifestyle changes.   Discussed to set up eye exam.  Follow up in 6 months.  Orders: -     POCT glycosylated hemoglobin (Hb A1C)  Primary hypertension Assessment & Plan: Controlled.  Continue valsartan-HCTZ 320-25 mg daily and amlodipine 5 mg daily.          Pleas Koch, NP

## 2022-06-03 NOTE — Assessment & Plan Note (Signed)
Controlled with A1C today of 6.2!  Remain off metformin for now. Continue to work on lifestyle changes.   Discussed to set up eye exam.  Follow up in 6 months.

## 2022-06-04 ENCOUNTER — Ambulatory Visit: Payer: Medicare Other | Admitting: Primary Care

## 2022-06-04 DIAGNOSIS — C4359 Malignant melanoma of other part of trunk: Secondary | ICD-10-CM | POA: Diagnosis not present

## 2022-06-05 ENCOUNTER — Ambulatory Visit: Payer: Medicare Other | Admitting: Primary Care

## 2022-06-05 DIAGNOSIS — M48062 Spinal stenosis, lumbar region with neurogenic claudication: Secondary | ICD-10-CM | POA: Diagnosis not present

## 2022-06-06 ENCOUNTER — Ambulatory Visit: Payer: Medicare Other | Admitting: Primary Care

## 2022-06-24 DIAGNOSIS — M48062 Spinal stenosis, lumbar region with neurogenic claudication: Secondary | ICD-10-CM | POA: Diagnosis not present

## 2022-06-25 DIAGNOSIS — E119 Type 2 diabetes mellitus without complications: Secondary | ICD-10-CM | POA: Diagnosis not present

## 2022-07-08 ENCOUNTER — Other Ambulatory Visit: Payer: Self-pay | Admitting: Primary Care

## 2022-07-08 DIAGNOSIS — G8929 Other chronic pain: Secondary | ICD-10-CM

## 2022-07-18 ENCOUNTER — Encounter: Payer: Self-pay | Admitting: Primary Care

## 2022-07-23 DIAGNOSIS — D225 Melanocytic nevi of trunk: Secondary | ICD-10-CM | POA: Diagnosis not present

## 2022-07-23 DIAGNOSIS — L570 Actinic keratosis: Secondary | ICD-10-CM | POA: Diagnosis not present

## 2022-07-23 DIAGNOSIS — L905 Scar conditions and fibrosis of skin: Secondary | ICD-10-CM | POA: Diagnosis not present

## 2022-07-23 DIAGNOSIS — Z1283 Encounter for screening for malignant neoplasm of skin: Secondary | ICD-10-CM | POA: Diagnosis not present

## 2022-07-23 DIAGNOSIS — D485 Neoplasm of uncertain behavior of skin: Secondary | ICD-10-CM | POA: Diagnosis not present

## 2022-07-23 DIAGNOSIS — X32XXXD Exposure to sunlight, subsequent encounter: Secondary | ICD-10-CM | POA: Diagnosis not present

## 2022-07-23 DIAGNOSIS — Z08 Encounter for follow-up examination after completed treatment for malignant neoplasm: Secondary | ICD-10-CM | POA: Diagnosis not present

## 2022-07-23 DIAGNOSIS — Z85820 Personal history of malignant melanoma of skin: Secondary | ICD-10-CM | POA: Diagnosis not present

## 2022-07-31 ENCOUNTER — Encounter (HOSPITAL_COMMUNITY): Payer: Self-pay

## 2022-07-31 ENCOUNTER — Ambulatory Visit (HOSPITAL_COMMUNITY)
Admission: EM | Admit: 2022-07-31 | Discharge: 2022-07-31 | Disposition: A | Discharge status: Home / Self Care | Payer: Medicare Other | Attending: Family Medicine | Admitting: Family Medicine

## 2022-07-31 ENCOUNTER — Ambulatory Visit (INDEPENDENT_AMBULATORY_CARE_PROVIDER_SITE_OTHER): Payer: Medicare Other

## 2022-07-31 DIAGNOSIS — L03116 Cellulitis of left lower limb: Secondary | ICD-10-CM

## 2022-07-31 DIAGNOSIS — S99912A Unspecified injury of left ankle, initial encounter: Secondary | ICD-10-CM

## 2022-07-31 MED ORDER — KETOROLAC TROMETHAMINE 60 MG/2ML IM SOLN
60.0000 mg | Freq: Once | INTRAMUSCULAR | Status: AC
  Administered 2022-07-31: 60 mg via INTRAMUSCULAR

## 2022-07-31 MED ORDER — DOXYCYCLINE HYCLATE 100 MG PO CAPS: 100.0000 mg | ORAL_CAPSULE | Freq: Two times a day (BID) | ORAL | 0 refills | Status: DC

## 2022-07-31 MED ORDER — KETOROLAC TROMETHAMINE 60 MG/2ML IM SOLN
INTRAMUSCULAR | Status: AC
  Filled 2022-07-31: qty 2

## 2022-07-31 NOTE — ED Triage Notes (Signed)
Pt states hit his lt ankle on his lawn mower a week ago. States has swelling and pain. Taking tylenol every 6 hrs with relief.

## 2022-07-31 NOTE — Discharge Instructions (Addendum)
You have a superficial skin infection of your left leg, I believe this is due to hitting it on the lawnmower.  We have given you a dose of anti-inflammatory in clinic, you can add some ibuprofen to your pain regimen tomorrow if you have breakthrough pain from the Tylenol.  Please continue to elevate your leg, you can ice it as well, do not apply ice directly to your leg.    You should see some improvement over the next 48 to 72 hours, if you do not see improvement, or have any worsening of symptoms like fever, nausea, vomiting or other new issues, please return to clinic or seek immediate care.

## 2022-07-31 NOTE — ED Provider Notes (Signed)
[facility]    CSN: [id number] Arrival date & time: 07/31/22  1948      History   Chief Complaint Chief Complaint  Patient presents with   Ankle Pain    HPI Kenneth [name] is a 67 y.o. male.   Patient presents to clinic for left ankle pain and swelling.  He hit it on his lawnmower about a week ago, had a minor abrasion from this impact.  Since then he has been icing and elevate the extremity without much relief.  Reports redness has extended from his foot to his mid calf. Denies fever.  Reports his Tdap was updated within the last few months.  He denies nausea, vomiting or diarrhea.  Ambulatory with pain.  The history is provided by the patient and medical records.  Ankle Pain Associated symptoms: no fatigue and no fever     Past Medical History:  Diagnosis Date   Anxiety and depression    Bipolar 1 disorder (HCC)    Blood transfusion without reported diagnosis    Chronic kidney disease    Depression    GERD (gastroesophageal reflux disease)    Hypercholesterolemia    Hypertension    Melanoma (HCC)    right flank   Sleep apnea    Type 2 diabetes mellitus (HCC)     Patient Active Problem List   Diagnosis Date Noted   Impacted cerumen of left ear 07/18/2021   Venous reflux 06/06/2021   Acute thoracic back pain 05/14/2019   Lower extremity numbness 04/17/2018   Rhinorrhea 04/08/2018   Vitamin B 12 deficiency 07/28/2017   Tobacco abuse 05/05/2017   Preventative health care 04/24/2017   Numbness and tingling 04/15/2017   OSA (obstructive sleep apnea) 02/07/2017   Epigastric abdominal tenderness without rebound tenderness 09/10/2016   GERD without esophagitis 09/10/2016   Nonspecific abnormal electrocardiogram (ECG) (EKG) 09/10/2016   Lower extremity edema 08/12/2016   Type 2 diabetes mellitus (HCC) 06/26/2016   Anxiety and depression 06/26/2016   Hypertension 06/26/2016   Hyperlipidemia 06/26/2016   Chronic neck pain 06/26/2016    Past Surgical  History:  Procedure Laterality Date   L hand surgery     MELANOMA EXCISION  09/21/2021   R knee arthoroscopy     SPLENECTOMY         Home Medications    Prior to Admission medications   Medication Sig Start Date End Date Taking? Authorizing Provider  doxycycline (VIBRAMYCIN) 100 MG capsule Take 1 capsule (100 mg total) by mouth 2 (two) times daily. 07/31/22  Yes Rinaldo Ratel, [place] N, FNP  Accu-Chek FastClix Lancets MISC USE TO CHECK BLOOD SUGAR UP TO 4 TIMES A DAY 03/07/22   Doreene Nest, NP  amLODipine (NORVASC) 5 MG tablet Take 1 tablet (5 mg total) by mouth daily. for blood pressure. 04/18/22   Doreene Nest, NP  APPLE CIDER VINEGAR PO Take by mouth.    [provider]  Ascorbic Acid (VITAMIN C) 1000 MG tablet Take 1,000 mg by mouth daily.    [provider]  atorvastatin (LIPITOR) 20 MG tablet Take 1 tablet (20 mg total) by mouth daily. for cholesterol. 06/10/21   Doreene Nest, NP  Biotin 10 MG CAPS Take by mouth. Patient not taking: Reported on 05/21/2022    [provider]  blood glucose meter kit and supplies KIT Dispense based on patient and insurance preference. Use up to four times daily as directed. (FOR ICD-9 250.00, 250.01). 11/18/18   Chestine Spore,  Keane Scrape, NP  COCONUT OIL PO Take by mouth daily.    [provider]  Cyanocobalamin (VITAMIN B-12 PO) Take 1 tablet by mouth daily.    [provider]  diclofenac (VOLTAREN) 75 MG EC tablet TAKE ONE TABLET EACH DAY AS NEEDEd for moderate pain 05/29/22   Doreene Nest, NP  fluticasone [facility]) 50 MCG/ACT nasal spray ONE SPRAY IN EACH NOSTRIL TWICE DAILY AS NEEDED FOR ALLERGIES OR RHINITIS 09/16/21   Doreene Nest, NP  GARLIC PO Take 1 tablet by mouth daily.     [provider]  Ginger, Zingiber officinalis, (GINGER PO) Take by mouth.    [provider]  glucose blood (ACCU-CHEK GUIDE) test strip USE TO CHECK UP TO 4 TIMES A DAY 03/07/22   Doreene Nest, NP  magnesium oxide (MAG-OX) 400 MG tablet Take 400 mg by mouth daily.    [provider]  methocarbamol (ROBAXIN) 500 MG tablet TAKE ONE TABLET DAILY AS NEEDED FOR MUSCLE SPASM 07/08/22   Doreene Nest, NP  Multiple Vitamins-Minerals (MULTIVITAMIN ADULT) TABS Take 1 tablet by mouth daily.    [provider]  ofloxacin (OCUFLOX) 0.3 % ophthalmic solution Place 1 drop into the right eye 4 (four) times daily. 09/11/21   Raspet, Noberto Retort, PA-C  Omega-3 Fatty Acids (OMEGA 3 PO) Take 1 capsule by mouth daily.    [provider]  sertraline (ZOLOFT) 50 MG tablet TAKE ONE TABLET EACH DAY FOR ANXIETY AND DEPRESSION 03/27/22   Doreene Nest, NP  valsartan-hydrochlorothiazide (DIOVAN-HCT) 320-25 MG tablet TAKE ONE TABLET DAILY FOR FOR BLOOD PRESSURE 04/28/22   Doreene Nest, NP    Family History Family History  Problem Relation Age of Onset   Hypertension Mother    Mental illness Mother    Diabetes Mother    Hypertension Father    Lung cancer Father    Diabetes Paternal Grandmother    Colon cancer Maternal Uncle    Esophageal cancer Neg Hx    Liver cancer Neg Hx    Pancreatic cancer Neg Hx    Rectal cancer Neg Hx    Stomach cancer Neg Hx     Social History Social History   Tobacco Use   Smoking status: Every Day    Packs/day: 1.00    Years: 41.00    Additional pack years: 0.00    Total pack years: 41.00    Types: Cigarettes   Smokeless tobacco: Never  Vaping Use   Vaping Use: Never used  Substance Use Topics   Alcohol use: No   Drug use: No     Allergies   Patient has no known allergies.   Review of Systems Review of Systems  Constitutional:  Negative for chills, fatigue and fever.  Respiratory:  Negative for cough and shortness of breath.   Gastrointestinal:  Negative for abdominal pain.  Musculoskeletal:  Positive for gait problem.  Skin:  Positive for wound.     Physical Exam Triage Vital Signs ED Triage Vitals   Enc Vitals Group     BP 07/31/22 2008 135/73     Pulse Rate 07/31/22 2008 95     Resp 07/31/22 2008 18     Temp 07/31/22 2007 98 F (36.7 C)     Temp Source 07/31/22 2007 Oral     SpO2 07/31/22 2008 92 %     Weight --      Height --      Head Circumference --  Peak Flow --      Pain Score 07/31/22 2007 7     Pain Loc --      Pain Edu? --      Excl. in GC? --    No data found.  Updated Vital Signs BP 135/73 (BP Location: Left Arm)   Pulse 95   Temp 98 F (36.7 C) (Oral)   Resp 18   SpO2 92%   Visual Acuity Right Eye Distance:   Left Eye Distance:   Bilateral Distance:    Right Eye Near:   Left Eye Near:    Bilateral Near:     Physical Exam Vitals and nursing note reviewed.  Constitutional:      Appearance: Normal appearance.  HENT:     Head: Normocephalic and atraumatic.     Right Ear: External ear normal.     Left Ear: External ear normal.     Nose: Nose normal.     Mouth/Throat:     Mouth: Mucous membranes are moist.  Eyes:     General: No scleral icterus.    Conjunctiva/sclera: Conjunctivae normal.  Cardiovascular:     Rate and Rhythm: Normal rate and regular rhythm.  Pulmonary:     Effort: Pulmonary effort is normal. No respiratory distress.  Musculoskeletal:        General: Swelling, tenderness and signs of injury present. No deformity. Normal range of motion.     Right lower leg: 2+ Pitting Edema present.     Left lower leg: 3+ Pitting Edema present.  Skin:    General: Skin is warm and dry.     Capillary Refill: Capillary refill takes less than 2 seconds.     Findings: Erythema and rash present.  Neurological:     General: No focal deficit present.     Mental Status: He is alert and oriented to person, place, and time.  Psychiatric:        Mood and Affect: Mood normal.        Behavior: Behavior normal. Behavior is cooperative.      UC Treatments / Results  Labs (all labs ordered are listed, but only abnormal results are  displayed) Labs Reviewed - No data to display  EKG   Radiology DG Ankle Complete Left  Result Date: 07/31/2022 CLINICAL DATA:  Trauma to the left ankle. EXAM: LEFT ANKLE COMPLETE - 3+ VIEW COMPARISON:  Left foot radiograph dated 09/21/2019. FINDINGS: There is no acute fracture or dislocation. The ankle mortise is intact. There is diffuse subcutaneous edema. IMPRESSION: 1. No acute fracture or dislocation. 2. Diffuse subcutaneous edema. Electronically Signed   By: Elgie Collard M.D.   On: 07/31/2022 20:27    Procedures Procedures (including critical care time)  Medications Ordered in UC Medications  ketorolac (TORADOL) injection 60 mg (60 mg Intramuscular Given 07/31/22 2040)    Initial Impression / Assessment and Plan / UC Course  I have reviewed the triage vital signs and the nursing notes.  Pertinent labs & imaging results that were available during my care of the patient were reviewed by me and considered in my medical decision making (see chart for details).  Vitals and triage reviewed, patient is hemodynamically stable.  Has continued lower extremity edema, his left extremity edema is worse than his right with erythema, warmth and tenderness.  Recent injury within the past week.  Pain to lateral ankle, ankle imaging negative for fracture or dislocation.  Will cover with doxycycline for cellulitis, encouraged elevation and icing as  well as symptomatic pain management with Tylenol and ibuprofen.  Strict emergency return precautions given, no questions at this time.     Final Clinical Impressions(s) / UC Diagnoses   Final diagnoses:  Cellulitis of leg, left  Injury of left ankle, initial encounter     Discharge Instructions      You have a superficial skin infection of your left leg, I believe this is due to hitting it on the lawnmower.  We have given you a dose of anti-inflammatory in clinic, you can add some ibuprofen to your pain regimen tomorrow if you have breakthrough  pain from the Tylenol.  Please continue to elevate your leg, you can ice it as well, do not apply ice directly to your leg.    You should see some improvement over the next 48 to 72 hours, if you do not see improvement, or have any worsening of symptoms like fever, nausea, vomiting or other new issues, please return to clinic or seek immediate care.       ED Prescriptions     Medication Sig Dispense Auth. Provider   doxycycline (VIBRAMYCIN) 100 MG capsule Take 1 capsule (100 mg total) by mouth 2 (two) times daily. 20 capsule Shrey Boike, [place] N, [place]      PDMP not reviewed this encounter.   Mairen Wallenstein, [place] N, [place] 07/31/22 2045

## 2022-08-07 DIAGNOSIS — M79662 Pain in left lower leg: Secondary | ICD-10-CM | POA: Diagnosis not present

## 2022-08-14 ENCOUNTER — Other Ambulatory Visit: Payer: Self-pay | Admitting: Primary Care

## 2022-08-14 DIAGNOSIS — G8929 Other chronic pain: Secondary | ICD-10-CM

## 2022-08-20 ENCOUNTER — Other Ambulatory Visit: Payer: Self-pay | Admitting: Primary Care

## 2022-08-20 DIAGNOSIS — I10 Essential (primary) hypertension: Secondary | ICD-10-CM

## 2022-09-04 ENCOUNTER — Other Ambulatory Visit: Payer: Self-pay | Admitting: Primary Care

## 2022-09-04 DIAGNOSIS — E785 Hyperlipidemia, unspecified: Secondary | ICD-10-CM

## 2022-10-02 ENCOUNTER — Other Ambulatory Visit: Payer: Self-pay | Admitting: Primary Care

## 2022-10-02 DIAGNOSIS — G8929 Other chronic pain: Secondary | ICD-10-CM

## 2022-10-04 DIAGNOSIS — M5416 Radiculopathy, lumbar region: Secondary | ICD-10-CM | POA: Diagnosis not present

## 2022-10-07 ENCOUNTER — Other Ambulatory Visit: Payer: Self-pay | Admitting: Primary Care

## 2022-10-07 DIAGNOSIS — G8929 Other chronic pain: Secondary | ICD-10-CM

## 2022-10-22 ENCOUNTER — Other Ambulatory Visit: Payer: Self-pay | Admitting: Orthopedic Surgery

## 2022-11-01 NOTE — Progress Notes (Signed)
Surgical Instructions   Your procedure is scheduled on Thursday November 14, 2022. Report to [facility] Main Entrance "A" at 9:00 A.M., then check in with the Admitting office. Any questions or running late day of surgery: call [phone]  Questions prior to your surgery date: call [phone], Monday-Friday, 8am-4pm. If you experience any cold or flu symptoms such as cough, fever, chills, shortness of breath, etc. between now and your scheduled surgery, please notify us at the above number.     Remember:  Do not eat after midnight the night before your surgery  You may drink clear liquids until 9:00 the morning of your surgery.   Clear liquids allowed are: Water, Non-Citrus Juices (without pulp), Carbonated Beverages, Clear Tea, Black Coffee Only (NO MILK, CREAM OR POWDERED CREAMER of any kind), and Gatorade.   Patient Instructions  The night before surgery:  No food after midnight. ONLY clear liquids after midnight   The day of surgery (if you have diabetes): Drink ONE (1) 12 oz G2 given to you in your pre admission testing appointment by 9:00 the morning of surgery. Drink in one sitting. Do not sip.  This drink was given to you during your hospital  pre-op appointment visit.  Nothing else to drink after completing the  12 oz bottle of G2.         If you have questions, please contact your surgeon's office.    Take these medicines the morning of surgery with A SIP OF WATER  amLODipine (NORVASC)  methocarbamol (ROBAXIN)  sertraline (ZOLOFT)    May take these medicines IF NEEDED: HYDROcodone-acetaminophen (NORCO/VICODIN)    One week prior to surgery, STOP taking any Aspirin (unless otherwise instructed by your surgeon) Aleve, Naproxen, Ibuprofen, Motrin, Advil, Goody's, BC's, all herbal medications, fish oil, and non-prescription vitamins.  This includes your diclofenac (VOLTAREN).             Do NOT Smoke (Tobacco/Vaping) for 24 hours prior to your procedure.  If  you use a CPAP at night, you may bring your mask/headgear for your overnight stay.   You will be asked to remove any contacts, glasses, piercing's, hearing aid's, dentures/partials prior to surgery. Please bring cases for these items if needed.    Patients discharged the day of surgery will not be allowed to drive home, and someone needs to stay with them for 24 hours.  SURGICAL WAITING ROOM VISITATION Patients may have no more than 2 support people in the waiting area - these visitors may rotate.   Pre-op nurse will coordinate an appropriate time for 1 ADULT support person, who may not rotate, to accompany patient in pre-op.  Children under the age of 88 must have an adult with them who is not the patient and must remain in the main waiting area with an adult.  If the patient needs to stay at the hospital during part of their recovery, the visitor guidelines for inpatient rooms apply.  Please refer to the [facility] website for the visitor guidelines for any additional information.   If you received a COVID test during your pre-op visit  it is requested that you wear a mask when out in public, stay away from anyone that may not be feeling well and notify your surgeon if you develop symptoms. If you have been in contact with anyone that has tested positive in the last 10 days please notify you surgeon.      Pre-operative 5 CHG Bathing Instructions   You can play  a key role in reducing the risk of infection after surgery. Your skin needs to be as free of germs as possible. You can reduce the number of germs on your skin by washing with CHG (chlorhexidine gluconate) soap before surgery. CHG is an antiseptic soap that kills germs and continues to kill germs even after washing.   DO NOT use if you have an allergy to chlorhexidine/CHG or antibacterial soaps. If your skin becomes reddened or irritated, stop using the CHG and notify one of our RNs at [phone].   Please shower with the CHG  soap starting 4 days before surgery using the following schedule:     Please keep in mind the following:  DO NOT shave, including legs and underarms, starting the day of your first shower.   You may shave your face at any point before/day of surgery.  Place clean sheets on your bed the day you start using CHG soap. Use a clean washcloth (not used since being washed) for each shower. DO NOT sleep with pets once you start using the CHG.   CHG Shower Instructions:  If you choose to wash your hair and private area, wash first with your normal shampoo/soap.  After you use shampoo/soap, rinse your hair and body thoroughly to remove shampoo/soap residue.  Turn the water OFF and apply about 3 tablespoons (45 ml) of CHG soap to a CLEAN washcloth.  Apply CHG soap ONLY FROM YOUR NECK DOWN TO YOUR TOES (washing for 3-5 minutes)  DO NOT use CHG soap on face, private areas, open wounds, or sores.  Pay special attention to the area where your surgery is being performed.  If you are having back surgery, having someone wash your back for you may be helpful. Wait 2 minutes after CHG soap is applied, then you may rinse off the CHG soap.  Pat dry with a clean towel  Put on clean clothes/pajamas   If you choose to wear lotion, please use ONLY the CHG-compatible lotions on the back of this paper.   Additional instructions for the day of surgery: DO NOT APPLY any lotions, deodorants, cologne, or perfumes.   Do not bring valuables to the hospital. [facility] is not responsible for any belongings/valuables. Do not wear nail polish, gel polish, artificial nails, or any other type of covering on natural nails (fingers and toes) Do not wear jewelry or makeup Put on clean/comfortable clothes.  Please brush your teeth.  Ask your nurse before applying any prescription medications to the skin.     CHG Compatible Lotions   Aveeno Moisturizing lotion  Cetaphil Moisturizing Cream  Cetaphil Moisturizing  Lotion  Clairol Herbal Essence Moisturizing Lotion, Dry Skin  Clairol Herbal Essence Moisturizing Lotion, Extra Dry Skin  Clairol Herbal Essence Moisturizing Lotion, Normal Skin  Curel Age Defying Therapeutic Moisturizing Lotion with Alpha Hydroxy  Curel Extreme Care Body Lotion  Curel Soothing Hands Moisturizing Hand Lotion  Curel Therapeutic Moisturizing Cream, Fragrance-Free  Curel Therapeutic Moisturizing Lotion, Fragrance-Free  Curel Therapeutic Moisturizing Lotion, Original Formula  Eucerin Daily Replenishing Lotion  Eucerin Dry Skin Therapy Plus Alpha Hydroxy Crme  Eucerin Dry Skin Therapy Plus Alpha Hydroxy Lotion  Eucerin Original Crme  Eucerin Original Lotion  Eucerin Plus Crme Eucerin Plus Lotion  Eucerin TriLipid Replenishing Lotion  Keri Anti-Bacterial Hand Lotion  Keri Deep Conditioning Original Lotion Dry Skin Formula Softly Scented  Keri Deep Conditioning Original Lotion, Fragrance Free Sensitive Skin Formula  Keri Lotion Fast Absorbing Fragrance Free Sensitive Skin Formula  Keri Lotion Fast Absorbing Softly Scented Dry Skin Formula  Keri Original Lotion  Keri Skin Renewal Lotion Keri Silky Smooth Lotion  Keri Silky Smooth Sensitive Skin Lotion  Nivea Body Creamy Conditioning Patent examiner Moisturizing Lotion Nivea Crme  Nivea Skin Firming Lotion  NutraDerm 30 Skin Lotion  NutraDerm Skin Lotion  NutraDerm Therapeutic Skin Cream  NutraDerm Therapeutic Skin Lotion  ProShield Protective Hand Cream  Provon moisturizing lotion  Please read over the following fact sheets that you were given.

## 2022-11-04 ENCOUNTER — Inpatient Hospital Stay (HOSPITAL_COMMUNITY): Admission: RE | Admit: 2022-11-04 | Payer: Medicare Other | Source: Ambulatory Visit

## 2022-11-05 ENCOUNTER — Other Ambulatory Visit: Payer: Self-pay | Admitting: Primary Care

## 2022-11-05 DIAGNOSIS — G8929 Other chronic pain: Secondary | ICD-10-CM

## 2022-11-06 DIAGNOSIS — H5212 Myopia, left eye: Secondary | ICD-10-CM | POA: Diagnosis not present

## 2022-11-07 ENCOUNTER — Encounter: Payer: Self-pay | Admitting: Primary Care

## 2022-11-07 NOTE — Telephone Encounter (Signed)
error 

## 2022-11-07 NOTE — Telephone Encounter (Signed)
Based on her last meeting he told me that he took the methocarbamol muscle relaxer medication infrequently and as needed.  I sent a 30-day supply 1 month ago which means he would be taking this daily now.  How often is he taking the muscle relaxer?

## 2022-11-07 NOTE — Telephone Encounter (Signed)
Patient called in and would like to know the reason for his medication refill being denied?

## 2022-11-08 MED ORDER — METHOCARBAMOL 500 MG PO TABS
500.0000 mg | ORAL_TABLET | Freq: Every day | ORAL | 0 refills | Status: DC | PRN
Start: 2022-11-08 — End: 2023-03-22

## 2022-11-08 NOTE — Telephone Encounter (Signed)
Noted.  Refill sent to pharmacy. 

## 2022-11-08 NOTE — Telephone Encounter (Signed)
Spoke to pt. He said he is taking 1 everyday. The bottle reads as needed. So he takes 1 a day because he needs it. States he can barely walk. Dr Marthenia Rolling has said he needs surgery on his back.

## 2022-11-14 ENCOUNTER — Ambulatory Visit (HOSPITAL_COMMUNITY): Admit: 2022-11-14 | Payer: Medicare HMO | Admitting: Orthopedic Surgery

## 2022-11-14 SURGERY — LUMBAR LAMINECTOMY/DECOMPRESSION MICRODISCECTOMY
Anesthesia: General

## 2022-12-04 ENCOUNTER — Encounter: Payer: Medicare HMO | Admitting: Primary Care

## 2022-12-11 DIAGNOSIS — Z1283 Encounter for screening for malignant neoplasm of skin: Secondary | ICD-10-CM | POA: Diagnosis not present

## 2022-12-11 DIAGNOSIS — Z85820 Personal history of malignant melanoma of skin: Secondary | ICD-10-CM | POA: Diagnosis not present

## 2022-12-11 DIAGNOSIS — Z08 Encounter for follow-up examination after completed treatment for malignant neoplasm: Secondary | ICD-10-CM | POA: Diagnosis not present

## 2022-12-11 DIAGNOSIS — D225 Melanocytic nevi of trunk: Secondary | ICD-10-CM | POA: Diagnosis not present

## 2022-12-11 DIAGNOSIS — D485 Neoplasm of uncertain behavior of skin: Secondary | ICD-10-CM | POA: Diagnosis not present

## 2023-01-03 ENCOUNTER — Ambulatory Visit (INDEPENDENT_AMBULATORY_CARE_PROVIDER_SITE_OTHER): Payer: Medicare HMO | Admitting: Primary Care

## 2023-01-03 ENCOUNTER — Encounter: Payer: Self-pay | Admitting: Primary Care

## 2023-01-03 VITALS — BP 122/80 | HR 73 | Temp 97.6°F | Ht 67.0 in | Wt 208.0 lb

## 2023-01-03 DIAGNOSIS — I10 Essential (primary) hypertension: Secondary | ICD-10-CM | POA: Diagnosis not present

## 2023-01-03 DIAGNOSIS — F419 Anxiety disorder, unspecified: Secondary | ICD-10-CM | POA: Diagnosis not present

## 2023-01-03 DIAGNOSIS — Z125 Encounter for screening for malignant neoplasm of prostate: Secondary | ICD-10-CM | POA: Diagnosis not present

## 2023-01-03 DIAGNOSIS — M5441 Lumbago with sciatica, right side: Secondary | ICD-10-CM

## 2023-01-03 DIAGNOSIS — M5442 Lumbago with sciatica, left side: Secondary | ICD-10-CM

## 2023-01-03 DIAGNOSIS — E1165 Type 2 diabetes mellitus with hyperglycemia: Secondary | ICD-10-CM

## 2023-01-03 DIAGNOSIS — E538 Deficiency of other specified B group vitamins: Secondary | ICD-10-CM

## 2023-01-03 DIAGNOSIS — G4733 Obstructive sleep apnea (adult) (pediatric): Secondary | ICD-10-CM

## 2023-01-03 DIAGNOSIS — E785 Hyperlipidemia, unspecified: Secondary | ICD-10-CM

## 2023-01-03 DIAGNOSIS — F32A Depression, unspecified: Secondary | ICD-10-CM

## 2023-01-03 DIAGNOSIS — Z0000 Encounter for general adult medical examination without abnormal findings: Secondary | ICD-10-CM | POA: Diagnosis not present

## 2023-01-03 DIAGNOSIS — Z122 Encounter for screening for malignant neoplasm of respiratory organs: Secondary | ICD-10-CM

## 2023-01-03 DIAGNOSIS — G8929 Other chronic pain: Secondary | ICD-10-CM | POA: Insufficient documentation

## 2023-01-03 DIAGNOSIS — Z720 Tobacco use: Secondary | ICD-10-CM

## 2023-01-03 DIAGNOSIS — Z23 Encounter for immunization: Secondary | ICD-10-CM

## 2023-01-03 DIAGNOSIS — K219 Gastro-esophageal reflux disease without esophagitis: Secondary | ICD-10-CM

## 2023-01-03 DIAGNOSIS — M542 Cervicalgia: Secondary | ICD-10-CM

## 2023-01-03 LAB — COMPREHENSIVE METABOLIC PANEL
ALT: 33 U/L (ref 0–53)
AST: 28 U/L (ref 0–37)
Albumin: 4.2 g/dL (ref 3.5–5.2)
Alkaline Phosphatase: 75 U/L (ref 39–117)
BUN: 15 mg/dL (ref 6–23)
CO2: 29 meq/L (ref 19–32)
Calcium: 9.7 mg/dL (ref 8.4–10.5)
Chloride: 102 meq/L (ref 96–112)
Creatinine, Ser: 0.86 mg/dL (ref 0.40–1.50)
GFR: 89.89 mL/min (ref >60.00)
Glucose, Bld: 141 mg/dL — ABNORMAL HIGH (ref 70–99)
Potassium: 4.1 meq/L (ref 3.5–5.1)
Sodium: 141 meq/L (ref 135–145)
Total Bilirubin: 0.5 mg/dL (ref 0.2–1.2)
Total Protein: 6.8 g/dL (ref 6.0–8.3)

## 2023-01-03 LAB — PSA, MEDICARE: PSA: 1.86 ng/mL (ref 0.10–4.00)

## 2023-01-03 LAB — LIPID PANEL
Cholesterol: 140 mg/dL (ref 0–200)
HDL: 33.8 mg/dL — ABNORMAL LOW (ref >39.00)
LDL Cholesterol: 56 mg/dL (ref 0–99)
NonHDL: 106.27
Total CHOL/HDL Ratio: 4
Triglycerides: 251 mg/dL — ABNORMAL HIGH (ref 0.0–149.0)
VLDL: 50.2 mg/dL — ABNORMAL HIGH (ref 0.0–40.0)

## 2023-01-03 LAB — VITAMIN B12: Vitamin B-12: 380 pg/mL (ref 211–911)

## 2023-01-03 LAB — HEMOGLOBIN A1C: Hgb A1c MFr Bld: 5.7 % (ref 4.6–6.5)

## 2023-01-03 NOTE — Assessment & Plan Note (Signed)
Repeat vitamin B12 level pending. 

## 2023-01-03 NOTE — Assessment & Plan Note (Addendum)
Prevnar 20 and influenza vaccines provided today Colonoscopy UTD, due 2029 PSA due and pending. Lung cancer screening referral placed.  Discussed the importance of a healthy diet and regular exercise in order for weight loss, and to reduce the risk of further co-morbidity.  Exam stable. Labs pending.  Follow up in 1 year for repeat physical.

## 2023-01-03 NOTE — Assessment & Plan Note (Signed)
Following with neurosurgery. He is contemplating surgery.   Continue methocarbamol 500 mg PRN and diclofenac 75 mg PRN for which he is taking daily now.

## 2023-01-03 NOTE — Assessment & Plan Note (Signed)
Controlled.  Continue Zoloft 50 mg daily. Continue to monitor.  

## 2023-01-03 NOTE — Assessment & Plan Note (Signed)
Controlled.  Continue amlodipine 5 mg daily and valsartan-hydrochlorothiazide 320-25 mg daily. CMP pending.

## 2023-01-03 NOTE — Assessment & Plan Note (Signed)
Chronic. Following with orthopedics.   Continue methocarbamol 500 mg PRN and diclofenac 75 mg PRN for which he is taking daily now.

## 2023-01-03 NOTE — Patient Instructions (Signed)
Stop by the lab prior to leaving today. I will notify you of your results once received.   It was a pleasure to see you today!  

## 2023-01-03 NOTE — Assessment & Plan Note (Signed)
Overall controlled.  Remain off treatment.

## 2023-01-03 NOTE — Assessment & Plan Note (Signed)
Not using CPAP machine, declines to do so.

## 2023-01-03 NOTE — Progress Notes (Signed)
Subjective:    Patient ID: Kenneth [name], male    DOB: 02-29-1956, 67 y.o.   MRN: [id number]  HPI  Kenneth [name] is a very pleasant 67 y.o. male who presents today for complete physical and follow up of chronic conditions.  Immunizations: -Tetanus: Completed in 2015 -Influenza: Influenza vaccine provided today.  -Shingles: Completed Shingrix series -Pneumonia: Completed Prevnar 13 in 2016 and pneumovax in 2020  Diet: Fair diet.  Exercise: No regular exercise.  Eye exam: Completes annually  Dental exam: Completes semi-annually    Colonoscopy: Completed in 2019, due 2029 Lung Cancer Screening: Never completed   PSA: Due  BP Readings from Last 3 Encounters:  01/03/23 122/80  07/31/22 135/73  06/03/22 132/84    Wt Readings from Last 3 Encounters:  01/03/23 208 lb (94.3 kg)  06/03/22 210 lb (95.3 kg)  05/21/22 213 lb (96.6 kg)        Review of Systems  Constitutional:  Negative for unexpected weight change.  HENT:  Negative for rhinorrhea.   Respiratory:  Negative for cough and shortness of breath.   Cardiovascular:  Negative for chest pain.  Gastrointestinal:  Negative for constipation and diarrhea.  Genitourinary:  Negative for difficulty urinating.  Musculoskeletal:  Positive for arthralgias, back pain and neck pain.  Skin:  Negative for rash.  Allergic/Immunologic: Negative for environmental allergies.  Neurological:  Positive for numbness. Negative for dizziness and headaches.  Psychiatric/Behavioral:  The patient is not nervous/anxious.          Past Medical History:  Diagnosis Date   Anxiety and depression    Bipolar 1 disorder (HCC)    Blood transfusion without reported diagnosis    Chronic kidney disease    Depression    GERD (gastroesophageal reflux disease)    Hypercholesterolemia    Hypertension    Melanoma (HCC)    right flank   Sleep apnea    Type 2 diabetes mellitus (HCC)     Social History   Socioeconomic History   Marital  status: Divorced    Spouse name: Not on file   Number of children: Not on file   Years of education: Not on file   Highest education level: Not on file  Occupational History   Not on file  Tobacco Use   Smoking status: Every Day    Current packs/day: 1.00    Average packs/day: 1 pack/day for 41.0 years (41.0 ttl pk-yrs)    Types: Cigarettes   Smokeless tobacco: Never  Vaping Use   Vaping status: Never Used  Substance and Sexual Activity   Alcohol use: No   Drug use: No   Sexual activity: Not on file  Other Topics Concern   Not on file  Social History Narrative   Single.   1 daughter, 1 grandchild.   Retired. Once worked in [employer].   Enjoys sports, racing, traveling.    Social Determinants of Health   Financial Resource Strain: Low Risk  (05/21/2022)   Overall Financial Resource Strain (CARDIA)    Difficulty of Paying Living Expenses: Not hard at all  Food Insecurity: No Food Insecurity (05/21/2022)   Hunger Vital Sign    Worried About Running Out of Food in the Last Year: Never true    Ran Out of Food in the Last Year: Never true  Transportation Needs: No Transportation Needs (05/21/2022)   PRAPARE - Administrator, Civil Service (Medical): No    Lack of Transportation (Non-Medical): No  Physical  Activity: Sufficiently Active (05/21/2022)   Exercise Vital Sign    Days of Exercise per Week: 7 days    Minutes of Exercise per Session: 60 min  Stress: No Stress Concern Present (05/21/2022)   [employer] of Occupational Health - Occupational Stress Questionnaire    Feeling of Stress : Not at all  Social Connections: Socially Isolated (05/21/2022)   Social Connection and Isolation Panel [NHANES]    Frequency of Communication with Friends and Family: Never    Frequency of Social Gatherings with Friends and Family: Never    Attends Religious Services: More than 4 times per year    Active Member of [employer] or Organizations: No    Attends Banker  Meetings: Never    Marital Status: Divorced  Catering manager Violence: Not At Risk (05/21/2022)   Humiliation, Afraid, Rape, and Kick questionnaire    Fear of Current or Ex-Partner: No    Emotionally Abused: No    Physically Abused: No    Sexually Abused: No    Past Surgical History:  Procedure Laterality Date   L hand surgery     MELANOMA EXCISION  09/21/2021   R knee arthoroscopy     SPLENECTOMY      Family History  Problem Relation Age of Onset   Hypertension Mother    Mental illness Mother    Diabetes Mother    Hypertension Father    Lung cancer Father    Diabetes Paternal Grandmother    Colon cancer Maternal Uncle    Esophageal cancer Neg Hx    Liver cancer Neg Hx    Pancreatic cancer Neg Hx    Rectal cancer Neg Hx    Stomach cancer Neg Hx     No Known Allergies  Current Outpatient Medications on File Prior to Visit  Medication Sig Dispense Refill   Accu-Chek FastClix Lancets MISC USE TO CHECK BLOOD SUGAR UP TO 4 TIMES A DAY 200 each 3   amLODipine (NORVASC) 5 MG tablet TAKE ONE TABLET DAILY FOR blood PRESSURE 90 tablet 2   Ascorbic Acid (VITAMIN C) 1000 MG tablet Take 1,000 mg by mouth daily. gummy     atorvastatin (LIPITOR) 20 MG tablet Take 1 tablet (20 mg total) by mouth every evening. for cholesterol. 90 tablet 0   blood glucose meter kit and supplies KIT Dispense based on patient and insurance preference. Use up to four times daily as directed. (FOR ICD-9 250.00, 250.01). 1 each 0   Carboxymeth-Glycerin-Polysorb (REFRESH DIGITAL) 0.5-1-0.5 % SOLN Place 1 drop into both eyes every other day.     cyanocobalamin (VITAMIN B12) 1000 MCG tablet Take 2,000 mcg by mouth daily.     diclofenac (VOLTAREN) 75 MG EC tablet TAKE ONE TABLET EACH DAY AS NEEDEd for moderate pain (Patient taking differently: Take 75 mg by mouth daily.) 90 tablet 0   fluticasone (FLONASE) 50 MCG/ACT nasal spray ONE SPRAY IN EACH NOSTRIL TWICE DAILY AS NEEDED FOR ALLERGIES OR RHINITIS (Patient  taking differently: Place 2 sprays into both nostrils every other day.) 48 g 0   glucose blood (ACCU-CHEK GUIDE) test strip USE TO CHECK UP TO 4 TIMES A DAY 200 strip 3   HYDROcodone-acetaminophen (NORCO/VICODIN) 5-325 MG tablet Take 1 tablet by mouth every 6 (six) hours as needed for moderate pain or severe pain.     methocarbamol (ROBAXIN) 500 MG tablet Take 1 tablet (500 mg total) by mouth daily as needed for muscle spasms. 90 tablet 0   Multiple Vitamins-Minerals (  MULTIVITAMIN ADULT) TABS Take 1 tablet by mouth daily.     sertraline (ZOLOFT) 50 MG tablet TAKE ONE TABLET EACH DAY FOR ANXIETY AND DEPRESSION 90 tablet 3   valsartan-hydrochlorothiazide (DIOVAN-HCT) 320-25 MG tablet TAKE ONE TABLET DAILY FOR FOR BLOOD PRESSURE 90 tablet 1   No current facility-administered medications on file prior to visit.    BP 122/80 (BP Location: Right Arm, Patient Position: Sitting, Cuff Size: Large)   Pulse 73   Temp 97.6 F (36.4 C)   Ht 5\' 7"  (1.702 m)   Wt 208 lb (94.3 kg)   SpO2 94%   BMI 32.58 kg/m  Objective:   Physical Exam HENT:     Right Ear: Tympanic membrane and ear canal normal.     Left Ear: Tympanic membrane and ear canal normal.  Eyes:     Pupils: Pupils are equal, round, and reactive to light.  Cardiovascular:     Rate and Rhythm: Normal rate and regular rhythm.  Pulmonary:     Effort: Pulmonary effort is normal.     Breath sounds: Normal breath sounds.  Abdominal:     General: Bowel sounds are normal.     Palpations: Abdomen is soft.     Tenderness: There is no abdominal tenderness.  Musculoskeletal:        General: Normal range of motion.     Cervical back: Neck supple.  Skin:    General: Skin is warm and dry.  Neurological:     Mental Status: He is alert and oriented to person, place, and time.     Cranial Nerves: No cranial nerve deficit.     Deep Tendon Reflexes:     Reflex Scores:      Patellar reflexes are 2+ on the right side and 2+ on the left  side. Psychiatric:        Mood and Affect: Mood normal.           Assessment & Plan:  Preventative health care Assessment & Plan: Prevnar 20 and influenza vaccines provided today Colonoscopy UTD, due 2029 PSA due and pending. Lung cancer screening referral placed.  Discussed the importance of a healthy diet and regular exercise in order for weight loss, and to reduce the risk of further co-morbidity.  Exam stable. Labs pending.  Follow up in 1 year for repeat physical.    Screening for lung cancer -     Ambulatory Referral for Lung Cancer Scre  Primary hypertension Assessment & Plan: Controlled.  Continue amlodipine 5 mg daily and valsartan-hydrochlorothiazide 320-25 mg daily. CMP pending.  Orders: -     Comprehensive metabolic panel  OSA (obstructive sleep apnea) Assessment & Plan: Not using CPAP machine, declines to do so.   GERD without esophagitis Assessment & Plan: Overall controlled.  Remain off treatment.   Type 2 diabetes mellitus with hyperglycemia, without long-term current use of insulin (HCC) Assessment & Plan: Repeat A1C pending.  Continue off treatment.   Follow up in 6 months.  Orders: -     Hemoglobin A1c -     Comprehensive metabolic panel  Anxiety and depression Assessment & Plan: Controlled.  Continue Zoloft 50 mg daily.  Continue to monitor.   Tobacco abuse Assessment & Plan: He did not connect with lung cancer screening program last year. He agrees this year, new referral placed.   Chronic midline low back pain with bilateral sciatica Assessment & Plan: Following with neurosurgery. He is contemplating surgery.   Continue methocarbamol 500 mg PRN and  diclofenac 75 mg PRN for which he is taking daily now.    Chronic neck pain Assessment & Plan: Chronic. Following with orthopedics.   Continue methocarbamol 500 mg PRN and diclofenac 75 mg PRN for which he is taking daily now.   Hyperlipidemia, unspecified  hyperlipidemia type Assessment & Plan: Repeat lipid panel pending.  Discussed the importance of a healthy diet and regular exercise in order for weight loss, and to reduce the risk of further co-morbidity. Continue atorvastatin 20 mg daily.   Orders: -     Lipid panel  Vitamin B 12 deficiency Assessment & Plan: Repeat vitamin B12 level pending.  Orders: -     Vitamin B12  Screening for prostate cancer -     PSA, Medicare        Doreene Nest, NP

## 2023-01-03 NOTE — Assessment & Plan Note (Signed)
He did not connect with lung cancer screening program last year. He agrees this year, new referral placed.

## 2023-01-03 NOTE — Assessment & Plan Note (Signed)
Repeat lipid panel pending.  Discussed the importance of a healthy diet and regular exercise in order for weight loss, and to reduce the risk of further co-morbidity. Continue atorvastatin 20 mg daily.

## 2023-01-03 NOTE — Assessment & Plan Note (Signed)
Repeat A1C pending.  Continue off treatment.   Follow up in 6 months.

## 2023-01-10 DIAGNOSIS — C4359 Malignant melanoma of other part of trunk: Secondary | ICD-10-CM | POA: Diagnosis not present

## 2023-01-15 ENCOUNTER — Other Ambulatory Visit: Payer: Self-pay | Admitting: Primary Care

## 2023-01-15 DIAGNOSIS — I10 Essential (primary) hypertension: Secondary | ICD-10-CM

## 2023-01-31 DIAGNOSIS — C4359 Malignant melanoma of other part of trunk: Secondary | ICD-10-CM | POA: Diagnosis not present

## 2023-02-04 ENCOUNTER — Other Ambulatory Visit: Payer: Self-pay | Admitting: Primary Care

## 2023-02-04 DIAGNOSIS — G8929 Other chronic pain: Secondary | ICD-10-CM

## 2023-02-05 DIAGNOSIS — G959 Disease of spinal cord, unspecified: Secondary | ICD-10-CM | POA: Diagnosis not present

## 2023-02-05 DIAGNOSIS — M48062 Spinal stenosis, lumbar region with neurogenic claudication: Secondary | ICD-10-CM | POA: Diagnosis not present

## 2023-02-05 DIAGNOSIS — M4802 Spinal stenosis, cervical region: Secondary | ICD-10-CM | POA: Diagnosis not present

## 2023-02-05 DIAGNOSIS — Z6832 Body mass index (BMI) 32.0-32.9, adult: Secondary | ICD-10-CM | POA: Diagnosis not present

## 2023-02-14 ENCOUNTER — Other Ambulatory Visit: Payer: Self-pay | Admitting: Primary Care

## 2023-02-14 DIAGNOSIS — E785 Hyperlipidemia, unspecified: Secondary | ICD-10-CM

## 2023-03-21 ENCOUNTER — Other Ambulatory Visit: Payer: Self-pay | Admitting: Primary Care

## 2023-03-21 DIAGNOSIS — G8929 Other chronic pain: Secondary | ICD-10-CM

## 2023-03-29 ENCOUNTER — Other Ambulatory Visit: Payer: Self-pay | Admitting: Primary Care

## 2023-03-29 DIAGNOSIS — J309 Allergic rhinitis, unspecified: Secondary | ICD-10-CM

## 2023-03-31 ENCOUNTER — Ambulatory Visit (HOSPITAL_COMMUNITY)
Admission: EM | Admit: 2023-03-31 | Discharge: 2023-03-31 | Disposition: A | Discharge status: Home / Self Care | Payer: Medicare HMO

## 2023-03-31 DIAGNOSIS — R079 Chest pain, unspecified: Secondary | ICD-10-CM

## 2023-03-31 NOTE — ED Notes (Signed)
Patient is being discharged from the Urgent Care and sent to the Emergency Department via self . Per provider, patient is in need of higher level of care due to chest pain. Patient is aware and verbalizes understanding of plan of care. There were no vitals filed for this visit.

## 2023-03-31 NOTE — ED Provider Notes (Signed)
Chest pain x2 days. Seen in triage. Patient directed to the ED for emergent follow-up. Pt stable, ambulatory.  Discussed case with Dr. Leonides Grills and he was in agreement with plan for ED work-up.   Bing Neighbors, NP 03/31/23 2008

## 2023-04-23 DIAGNOSIS — M48062 Spinal stenosis, lumbar region with neurogenic claudication: Secondary | ICD-10-CM | POA: Diagnosis not present

## 2023-04-23 DIAGNOSIS — M542 Cervicalgia: Secondary | ICD-10-CM | POA: Diagnosis not present

## 2023-04-23 DIAGNOSIS — M4807 Spinal stenosis, lumbosacral region: Secondary | ICD-10-CM | POA: Diagnosis not present

## 2023-04-23 DIAGNOSIS — M48061 Spinal stenosis, lumbar region without neurogenic claudication: Secondary | ICD-10-CM | POA: Diagnosis not present

## 2023-04-23 DIAGNOSIS — G959 Disease of spinal cord, unspecified: Secondary | ICD-10-CM | POA: Diagnosis not present

## 2023-04-23 DIAGNOSIS — M4712 Other spondylosis with myelopathy, cervical region: Secondary | ICD-10-CM | POA: Diagnosis not present

## 2023-04-23 DIAGNOSIS — M47816 Spondylosis without myelopathy or radiculopathy, lumbar region: Secondary | ICD-10-CM | POA: Diagnosis not present

## 2023-04-23 DIAGNOSIS — M4802 Spinal stenosis, cervical region: Secondary | ICD-10-CM | POA: Diagnosis not present

## 2023-04-23 DIAGNOSIS — M4803 Spinal stenosis, cervicothoracic region: Secondary | ICD-10-CM | POA: Diagnosis not present

## 2023-04-23 DIAGNOSIS — M5126 Other intervertebral disc displacement, lumbar region: Secondary | ICD-10-CM | POA: Diagnosis not present

## 2023-04-30 ENCOUNTER — Other Ambulatory Visit: Payer: Self-pay | Admitting: Primary Care

## 2023-04-30 DIAGNOSIS — F32A Depression, unspecified: Secondary | ICD-10-CM

## 2023-05-02 DIAGNOSIS — E785 Hyperlipidemia, unspecified: Secondary | ICD-10-CM | POA: Diagnosis not present

## 2023-05-02 DIAGNOSIS — Z85820 Personal history of malignant melanoma of skin: Secondary | ICD-10-CM | POA: Diagnosis not present

## 2023-05-02 DIAGNOSIS — F319 Bipolar disorder, unspecified: Secondary | ICD-10-CM | POA: Diagnosis not present

## 2023-05-02 DIAGNOSIS — E669 Obesity, unspecified: Secondary | ICD-10-CM | POA: Diagnosis not present

## 2023-05-02 DIAGNOSIS — Z6831 Body mass index (BMI) 31.0-31.9, adult: Secondary | ICD-10-CM | POA: Diagnosis not present

## 2023-05-02 DIAGNOSIS — I10 Essential (primary) hypertension: Secondary | ICD-10-CM | POA: Diagnosis not present

## 2023-05-02 DIAGNOSIS — Z008 Encounter for other general examination: Secondary | ICD-10-CM | POA: Diagnosis not present

## 2023-05-02 DIAGNOSIS — E1169 Type 2 diabetes mellitus with other specified complication: Secondary | ICD-10-CM | POA: Diagnosis not present

## 2023-05-09 DIAGNOSIS — Z6831 Body mass index (BMI) 31.0-31.9, adult: Secondary | ICD-10-CM | POA: Diagnosis not present

## 2023-05-09 DIAGNOSIS — M4802 Spinal stenosis, cervical region: Secondary | ICD-10-CM | POA: Diagnosis not present

## 2023-05-30 ENCOUNTER — Encounter: Payer: Self-pay | Admitting: Primary Care

## 2023-05-30 LAB — ALB/CREAT RATIO, TIMED UR: Albumin/Creatinine Ratio, Urine, POC: 16

## 2023-06-16 DIAGNOSIS — G959 Disease of spinal cord, unspecified: Secondary | ICD-10-CM | POA: Diagnosis not present

## 2023-06-24 ENCOUNTER — Ambulatory Visit (INDEPENDENT_AMBULATORY_CARE_PROVIDER_SITE_OTHER)

## 2023-06-24 VITALS — Ht 67.0 in | Wt 208.0 lb

## 2023-06-24 DIAGNOSIS — Z0000 Encounter for general adult medical examination without abnormal findings: Secondary | ICD-10-CM | POA: Diagnosis not present

## 2023-06-24 NOTE — Progress Notes (Signed)
 Subjective:   Kenneth [name] is a 68 y.o. who presents for a Medicare Wellness preventive visit.  Visit Complete: Virtual I connected with  Kenneth [name] on 06/24/23 by a audio enabled telemedicine application and verified that I am speaking with the correct person using two identifiers.  Patient Location: Home  Provider Location: Home Office  I discussed the limitations of evaluation and management by telemedicine. The patient expressed understanding and agreed to proceed.  Vital Signs: Because this visit was a virtual/telehealth visit, some criteria may be missing or patient reported. Any vitals not documented were not able to be obtained and vitals that have been documented are patient reported.  VideoDeclined- This patient declined Librarian, academic. Therefore the visit was completed with audio only.  Persons Participating in Visit: Patient.  AWV Questionnaire: No: Patient Medicare AWV questionnaire was not completed prior to this visit.  Cardiac Risk Factors include: advanced age (>26men, >54 women);diabetes mellitus;dyslipidemia;hypertension;male gender;obesity (BMI >30kg/m2);sedentary lifestyle;smoking/ tobacco exposure     Objective:    Today's Vitals   06/24/23 1345 06/24/23 1347  Weight: 208 lb (94.3 kg)   Height: 5\' 7"  (1.702 m)   PainSc:  7    Body mass index is 32.58 kg/m.     06/24/2023    2:11 PM 05/21/2022    1:11 PM 07/11/2020   11:09 PM 05/05/2019   10:40 AM 11/04/2018    4:39 AM 04/30/2018    2:20 PM 01/19/2018    2:34 AM  Advanced Directives  Does Patient Have a Medical Advance Directive? No No No No No No No  Would patient like information on creating a medical advance directive?  No - Patient declined No - Patient declined No - Patient declined  No - Patient declined No - Patient declined    Current Medications (verified) Outpatient Encounter Medications as of 06/24/2023  Medication Sig   Accu-Chek FastClix Lancets MISC  USE TO CHECK BLOOD SUGAR UP TO 4 TIMES A DAY   amLODipine (NORVASC) 5 MG tablet TAKE ONE TABLET DAILY FOR blood PRESSURE   Ascorbic Acid (VITAMIN C) 1000 MG tablet Take 1,000 mg by mouth daily. gummy   atorvastatin (LIPITOR) 20 MG tablet TAKE ONE TABLET BY MOUTH EVERY evening FOR cholesterol   blood glucose meter kit and supplies KIT Dispense based on patient and insurance preference. Use up to four times daily as directed. (FOR ICD-9 250.00, 250.01).   Carboxymeth-Glycerin-Polysorb (REFRESH DIGITAL) 0.5-1-0.5 % SOLN Place 1 drop into both eyes every other day.   cyanocobalamin (VITAMIN B12) 1000 MCG tablet Take 2,000 mcg by mouth daily.   diclofenac (VOLTAREN) 75 MG EC tablet TAKE ONE TABLET EACH DAY AS NEEDEd for moderate pain   fluticasone (FLONASE) 50 MCG/ACT nasal spray ONE SPRAY IN EACH NOSTRIL TWICE DAILY AS NEEDED FOR ALLERGIES OR RHINITIS   glucose blood (ACCU-CHEK GUIDE) test strip USE TO CHECK UP TO 4 TIMES A DAY   methocarbamol (ROBAXIN) 500 MG tablet TAKE ONE TABLET BY MOUTH DAILY AS NEEDED FOR muscle SPASMS   Multiple Vitamins-Minerals (MULTIVITAMIN ADULT) TABS Take 1 tablet by mouth daily.   sertraline (ZOLOFT) 50 MG tablet TAKE ONE TABLET EACH DAY FOR ANXIETY AND DEPRESSION   valsartan-hydrochlorothiazide (DIOVAN-HCT) 320-25 MG tablet TAKE ONE TABLET DAILY FOR BLOOD PRESSURE   HYDROcodone-acetaminophen (NORCO/VICODIN) 5-325 MG tablet Take 1 tablet by mouth every 6 (six) hours as needed for moderate pain or severe pain. (Patient not taking: Reported on 06/24/2023)   No facility-administered encounter  medications on file as of 06/24/2023.    Allergies (verified) Patient has no known allergies.   History: Past Medical History:  Diagnosis Date   Anxiety and depression    Bipolar 1 disorder (HCC)    Blood transfusion without reported diagnosis    Chronic kidney disease    Depression    GERD (gastroesophageal reflux disease)    Hypercholesterolemia    Hypertension    Melanoma  (HCC)    right flank   Sleep apnea    Type 2 diabetes mellitus (HCC)    Past Surgical History:  Procedure Laterality Date   L hand surgery     MELANOMA EXCISION  09/21/2021   R knee arthoroscopy     SPLENECTOMY     Family History  Problem Relation Age of Onset   Hypertension Mother    Mental illness Mother    Diabetes Mother    Hypertension Father    Lung cancer Father    Diabetes Paternal Grandmother    Colon cancer Maternal Uncle    Esophageal cancer Neg Hx    Liver cancer Neg Hx    Pancreatic cancer Neg Hx    Rectal cancer Neg Hx    Stomach cancer Neg Hx    Social History   Socioeconomic History   Marital status: Divorced    Spouse name: Not on file   Number of children: Not on file   Years of education: Not on file   Highest education level: Not on file  Occupational History   Not on file  Tobacco Use   Smoking status: Every Day    Current packs/day: 1.00    Average packs/day: 1 pack/day for 41.0 years (41.0 ttl pk-yrs)    Types: Cigarettes   Smokeless tobacco: Never  Vaping Use   Vaping status: Never Used  Substance and Sexual Activity   Alcohol use: No   Drug use: No   Sexual activity: Not on file  Other Topics Concern   Not on file  Social History Narrative   Single.   1 daughter, 1 grandchild.   Retired. Once worked in [employer].   Enjoys sports, racing, traveling.    Social Drivers of Corporate investment banker Strain: Low Risk  (05/21/2022)   Overall Financial Resource Strain (CARDIA)    Difficulty of Paying Living Expenses: Not hard at all  Food Insecurity: No Food Insecurity (06/24/2023)   Hunger Vital Sign    Worried About Running Out of Food in the Last Year: Never true    Ran Out of Food in the Last Year: Never true  Transportation Needs: No Transportation Needs (06/24/2023)   PRAPARE - Administrator, Civil Service (Medical): No    Lack of Transportation (Non-Medical): No  Physical Activity: Inactive (06/24/2023)   Exercise  Vital Sign    Days of Exercise per Week: 0 days    Minutes of Exercise per Session: 0 min  Stress: No Stress Concern Present (06/24/2023)   [employer] of Occupational Health - Occupational Stress Questionnaire    Feeling of Stress : Not at all  Social Connections: Socially Isolated (06/24/2023)   Social Connection and Isolation Panel [NHANES]    Frequency of Communication with Friends and Family: More than three times a week    Frequency of Social Gatherings with Friends and Family: Three times a week    Attends Religious Services: Never    Active Member of Clubs or Organizations: No    Attends Club or  Organization Meetings: Never    Marital Status: Divorced    Tobacco Counseling Ready to quit: Not Answered Counseling given: Not Answered    Clinical Intake:  Pre-visit preparation completed: Yes  Pain : 0-10 Pain Score: 7  Pain Type: Chronic pain Pain Location: Neck Pain Descriptors / Indicators: Aching Pain Onset: More than a month ago Pain Frequency: Constant Pain Relieving Factors: rest,medications  Pain Relieving Factors: rest,medications  BMI - recorded: 32.58 Nutritional Status: BMI > 30  Obese Nutritional Risks: None Diabetes: No  Lab Results  Component Value Date   HGBA1C 5.7 01/03/2023   HGBA1C 6.2 (A) 06/03/2022   HGBA1C 6.3 (A) 03/07/2022     How often do you need to have someone help you when you read instructions, pamphlets, or other written materials from your doctor or pharmacy?: 1 - Never  Interpreter Needed?: No  Comments: lives alone Information entered by :: B.Benjaman Artman,LPN   Activities of Daily Living     06/24/2023    2:12 PM  In your present state of health, do you have any difficulty performing the following activities:  Hearing? 0  Vision? 0  Difficulty concentrating or making decisions? 0  Walking or climbing stairs? 0  Dressing or bathing? 0  Doing errands, shopping? 0  Preparing Food and eating ? N  Using the Toilet? N   In the past six months, have you accidently leaked urine? N  Do you have problems with loss of bowel control? N  Managing your Medications? N  Managing your Finances? N  Housekeeping or managing your Housekeeping? N    Patient Care Team: Doreene Nest, NP as PCP - General (Internal Medicine) Lucky Cowboy, MD as Attending Physician (Internal Medicine) Kathyrn Sheriff, [facility] (Inactive) as Pharmacist (Pharmacist)  Indicate any recent Medical Services you may have received from other than [facility] providers in the past year (date may be approximate).     Assessment:   This is a routine wellness examination for [place].  Hearing/Vision screen Hearing Screening - Comments:: Pt says his hearing is excellent Vision Screening - Comments:: Pt says his vision is good Idalia Needle   Goals Addressed             This Visit's Progress    Follow up with Primary Care Provider   On track    Starting 04/30/2018, I will continue to take medications as prescribed and to keep appointments with PCP as scheduled.      patient   Not on track    06/24/23-Wants to drive to [place].      Patient Stated   On track    06/24/23-I will maintain and continue medications as prescribed.        Depression Screen     06/24/2023    2:02 PM 01/03/2023   10:57 AM 06/03/2022    2:31 PM 05/21/2022    1:06 PM 04/18/2022    2:44 PM 05/05/2019   10:43 AM 04/30/2018   12:36 PM  PHQ 2/9 Scores  PHQ - 2 Score 0 0 0 0 0 0 0  PHQ- 9 Score   0 0 0 0 0    Fall Risk     06/24/2023    1:53 PM 01/03/2023   10:57 AM 06/03/2022    2:31 PM 05/21/2022   12:56 PM 04/18/2022    2:43 PM  Fall Risk   Falls in the past year? 1 0 1 1 1   Number falls in past yr: 0 0 1 0  0  Injury with Fall? 1 0 1 0 0  Risk for fall due to : Impaired balance/gait No Fall Risks History of fall(s) No Fall Risks History of fall(s)  Follow up Education provided;Falls prevention discussed Falls evaluation completed Falls evaluation completed Falls  prevention discussed;Falls evaluation completed;Education provided Falls evaluation completed    MEDICARE RISK AT HOME:  Medicare Risk at Home Any stairs in or around the home?: Yes If so, are there any without handrails?: Yes Home free of loose throw rugs in walkways, pet beds, electrical cords, etc?: Yes Adequate lighting in your home to reduce risk of falls?: Yes Life alert?: No Use of a cane, walker or w/c?: Yes (cane sometimes) Grab bars in the bathroom?: Yes Shower chair or bench in shower?: No Elevated toilet seat or a handicapped toilet?: No  TIMED UP AND GO:  Was the test performed?  No  Cognitive Function: 6CIT completed    05/05/2019   10:47 AM 04/30/2018   12:37 PM 04/15/2017   11:07 AM  MMSE - Mini Mental State Exam  Orientation to time 5 5 5   Orientation to Place 5 5 5   Registration 3 3 3   Attention/ Calculation 5 0 0  Recall 3 3 3   Language- name 2 objects  0 0  Language- repeat 1 1 1   Language- follow 3 step command  3 3  Language- read & follow direction  0 0  Write a sentence  0 0  Copy design  0 0  Total score  20 20        06/24/2023    2:15 PM 05/21/2022    1:21 PM  6CIT Screen  What Year? 0 points 0 points  What month? 0 points 0 points  What time? 0 points 0 points  Count back from 20 0 points 0 points  Months in reverse 0 points 0 points  Repeat phrase 0 points 0 points  Total Score 0 points 0 points    Immunizations Immunization History  Administered Date(s) Administered   Fluad Quad(high Dose 65+) 03/07/2022   Fluad Trivalent(High Dose 65+) 01/03/2023   Influenza,inj,Quad PF,6+ Mos 04/24/2017, 02/04/2018, 12/07/2020   Janssen (J&J) SARS-COV-2 Vaccination 12/01/2019   Moderna SARS-COV2 Booster Vaccination 04/11/2020   PNEUMOCOCCAL CONJUGATE-20 01/03/2023   Pneumococcal Conjugate-13 01/12/2015   Pneumococcal Polysaccharide-23 05/27/2018   Tdap 10/08/2013   Zoster Recombinant(Shingrix) 05/15/2019, 05/16/2021    Screening  Tests Health Maintenance  Topic Date Due   Meningococcal B Vaccine (1 of 4 - Increased Risk) Never done   Lung Cancer Screening  09/09/2011   COVID-19 Vaccine (2 - Janssen risk series) 05/09/2020   FOOT EXAM  03/08/2023   OPHTHALMOLOGY EXAM  07/18/2023 (Originally 06/20/2022)   HEMOGLOBIN A1C  07/04/2023   DTaP/Tdap/Td (2 - Td or Tdap) 10/09/2023   Diabetic kidney evaluation - eGFR measurement  01/03/2024   Diabetic kidney evaluation - Urine ACR  05/12/2024   Medicare Annual Wellness (AWV)  06/23/2024   Colonoscopy  06/24/2027   Pneumonia Vaccine 46+ Years old  Completed   INFLUENZA VACCINE  Completed   Hepatitis C Screening  Completed   Zoster Vaccines- Shingrix  Completed   HPV VACCINES  Aged Out    Health Maintenance  Health Maintenance Due  Topic Date Due   Meningococcal B Vaccine (1 of 4 - Increased Risk) Never done   Lung Cancer Screening  09/09/2011   COVID-19 Vaccine (2 - Janssen risk series) 05/09/2020   FOOT EXAM  03/08/2023  Health Maintenance Items Addressed: None needed  Additional Screening:  Vision Screening: Recommended annual ophthalmology exams for early detection of glaucoma and other disorders of the eye.  Dental Screening: Recommended annual dental exams for proper oral hygiene  Community Resource Referral / Chronic Care Management: CRR required this visit?  No   CCM required this visit?  No     Plan:     I have personally reviewed and noted the following in the patient's chart:   Medical and social history Use of alcohol, tobacco or illicit drugs  Current medications and supplements including opioid prescriptions. Patient is currently taking opioid prescriptions. Information provided to patient regarding non-opioid alternatives. Patient advised to discuss non-opioid treatment plan with their provider. Functional ability and status Nutritional status Physical activity Advanced directives List of other physicians Hospitalizations,  surgeries, and ER visits in previous 12 months Vitals Screenings to include cognitive, depression, and falls Referrals and appointments  In addition, I have reviewed and discussed with patient certain preventive protocols, quality metrics, and best practice recommendations. A written personalized care plan for preventive services as well as general preventive health recommendations were provided to patient.    Sue Lush, LPN   2/84/1324   After Visit Summary: (Declined) Due to this being a telephonic visit, with patients personalized plan was offered to patient but patient Declined AVS at this time   Notes: Nothing significant to report at this time.

## 2023-06-24 NOTE — Patient Instructions (Addendum)
 Kenneth [name] , Thank you for taking time to come for your Medicare Wellness Visit. I appreciate your ongoing commitment to your health goals. Please review the following plan we discussed and let me know if I can assist you in the future.   Referrals/Orders/Follow-Ups/Clinician Recommendations: none  This is a list of the screening recommended for you and due dates:  Health Maintenance  Topic Date Due   Meningitis B Vaccine (1 of 4 - Increased Risk) Never done   Screening for Lung Cancer  09/09/2011   COVID-19 Vaccine (2 - Janssen risk series) 05/09/2020   Complete foot exam   03/08/2023   Eye exam for diabetics  07/18/2023*   Hemoglobin A1C  07/04/2023   DTaP/Tdap/Td vaccine (2 - Td or Tdap) 10/09/2023   Yearly kidney function blood test for diabetes  01/03/2024   Yearly kidney health urinalysis for diabetes  05/12/2024   Medicare Annual Wellness Visit  06/23/2024   Colon Cancer Screening  06/24/2027   Pneumonia Vaccine  Completed   Flu Shot  Completed   Hepatitis C Screening  Completed   Zoster (Shingles) Vaccine  Completed   HPV Vaccine  Aged Out  *Topic was postponed. The date shown is not the original due date.    Advanced directives: (Declined) Advance directive discussed with you today. Even though you declined this today, please call our office should you change your mind, and we can give you the proper paperwork for you to fill out.  Next Medicare Annual Wellness Visit scheduled for next year: Yes 06/24/24 @ 1:40pm televisit

## 2023-06-26 ENCOUNTER — Other Ambulatory Visit: Payer: Self-pay | Admitting: Orthopedic Surgery

## 2023-06-26 ENCOUNTER — Other Ambulatory Visit: Payer: Self-pay | Admitting: Primary Care

## 2023-06-26 DIAGNOSIS — G8929 Other chronic pain: Secondary | ICD-10-CM

## 2023-07-08 ENCOUNTER — Ambulatory Visit: Payer: Medicare HMO | Admitting: Primary Care

## 2023-07-10 NOTE — Progress Notes (Signed)
 Surgical Instructions   Your procedure is scheduled on Thursday, April 24, 25.. Report to [facility] Main Entrance "A" at 11:30 A.M., then check in with the Admitting office. Any questions or running late day of surgery: call [phone]  Questions prior to your surgery date: call [phone], Monday-Friday, 8am-4pm. If you experience any cold or flu symptoms such as cough, fever, chills, shortness of breath, etc. between now and your scheduled surgery, please notify us at the above number.     Remember:  Do not eat after midnight the night before your surgery   You may drink clear liquids until 10:30 the morning of your surgery.   Clear liquids allowed are: Water, Non-Citrus Juices (without pulp), Carbonated Beverages, Clear Tea (no milk, honey, etc.), Black Coffee Only (NO MILK, CREAM OR POWDERED CREAMER of any kind), and Gatorade.    Take these medicines the morning of surgery with A SIP OF WATER: amLODipine (NORVASC)  sertraline (ZOLOFT)   May take these medicines IF NEEDED: acetaminophen-codeine  Refresh eye-drops fluticasone (FLONASE)  tiZANidine (ZANAFLEX)    One week prior to surgery, STOP taking any Aspirin (unless otherwise instructed by your surgeon) Aleve, Naproxen, Ibuprofen, Motrin, Advil, Goody's, BC's, all herbal medications, fish oil, and non-prescription vitamins. This includes your VOLTAREN.                     Do NOT Smoke (Tobacco/Vaping) for 24 hours prior to your procedure.  If you use a CPAP at night, you may bring your mask/headgear for your overnight stay.   You will be asked to remove any contacts, glasses, piercing's, hearing aid's, dentures/partials prior to surgery. Please bring cases for these items if needed.    Patients discharged the day of surgery will not be allowed to drive home, and someone needs to stay with them for 24 hours.  SURGICAL WAITING ROOM VISITATION Patients may have no more than 2 support people in the waiting area - these  visitors may rotate.   Pre-op nurse will coordinate an appropriate time for 1 ADULT support person, who may not rotate, to accompany patient in pre-op.  Children under the age of 25 must have an adult with them who is not the patient and must remain in the main waiting area with an adult.  If the patient needs to stay at the hospital during part of their recovery, the visitor guidelines for inpatient rooms apply.  Please refer to the [facility] website for the visitor guidelines for any additional information.   If you received a COVID test during your pre-op visit  it is requested that you wear a mask when out in public, stay away from anyone that may not be feeling well and notify your surgeon if you develop symptoms. If you have been in contact with anyone that has tested positive in the last 10 days please notify you surgeon.      Pre-operative CHG Bathing Instructions   You can play a key role in reducing the risk of infection after surgery. Your skin needs to be as free of germs as possible. You can reduce the number of germs on your skin by washing with CHG (chlorhexidine gluconate) soap before surgery. CHG is an antiseptic soap that kills germs and continues to kill germs even after washing.   DO NOT use if you have an allergy to chlorhexidine/CHG or antibacterial soaps. If your skin becomes reddened or irritated, stop using the CHG and notify one of our RNs at  [phone].              TAKE A SHOWER THE NIGHT BEFORE SURGERY AND THE DAY OF SURGERY    Please keep in mind the following:  DO NOT shave, including legs and underarms, 48 hours prior to surgery.   You may shave your face before/day of surgery.  Place clean sheets on your bed the night before surgery Use a clean washcloth (not used since being washed) for each shower. DO NOT sleep with pet's night before surgery.  CHG Shower Instructions:  Wash your face and private area with normal soap. If you choose to wash your  hair, wash first with your normal shampoo.  After you use shampoo/soap, rinse your hair and body thoroughly to remove shampoo/soap residue.  Turn the water OFF and apply half the bottle of CHG soap to a CLEAN washcloth.  Apply CHG soap ONLY FROM YOUR NECK DOWN TO YOUR TOES (washing for 3-5 minutes)  DO NOT use CHG soap on face, private areas, open wounds, or sores.  Pay special attention to the area where your surgery is being performed.  If you are having back surgery, having someone wash your back for you may be helpful. Wait 2 minutes after CHG soap is applied, then you may rinse off the CHG soap.  Pat dry with a clean towel  Put on clean pajamas    Additional instructions for the day of surgery: DO NOT APPLY any lotions, deodorants, cologne, or perfumes.   Do not wear jewelry or makeup Do not wear nail polish, gel polish, artificial nails, or any other type of covering on natural nails (fingers and toes) Do not bring valuables to the hospital. [facility] is not responsible for valuables/personal belongings. Put on clean/comfortable clothes.  Please brush your teeth.  Ask your nurse before applying any prescription medications to the skin.

## 2023-07-11 ENCOUNTER — Encounter (HOSPITAL_COMMUNITY): Payer: Self-pay

## 2023-07-11 ENCOUNTER — Other Ambulatory Visit: Payer: Self-pay

## 2023-07-11 ENCOUNTER — Encounter (HOSPITAL_COMMUNITY)
Admission: RE | Admit: 2023-07-11 | Discharge: 2023-07-11 | Disposition: A | Discharge status: Home / Self Care | Source: Ambulatory Visit | Attending: Orthopedic Surgery | Admitting: Orthopedic Surgery

## 2023-07-11 VITALS — BP 159/81 | HR 84 | Temp 98.2°F | Resp 18 | Ht 68.0 in | Wt 205.0 lb

## 2023-07-11 DIAGNOSIS — E7800 Pure hypercholesterolemia, unspecified: Secondary | ICD-10-CM | POA: Insufficient documentation

## 2023-07-11 DIAGNOSIS — I129 Hypertensive chronic kidney disease with stage 1 through stage 4 chronic kidney disease, or unspecified chronic kidney disease: Secondary | ICD-10-CM | POA: Insufficient documentation

## 2023-07-11 DIAGNOSIS — M4802 Spinal stenosis, cervical region: Secondary | ICD-10-CM | POA: Diagnosis not present

## 2023-07-11 DIAGNOSIS — F319 Bipolar disorder, unspecified: Secondary | ICD-10-CM | POA: Diagnosis not present

## 2023-07-11 DIAGNOSIS — E1165 Type 2 diabetes mellitus with hyperglycemia: Secondary | ICD-10-CM

## 2023-07-11 DIAGNOSIS — Z85820 Personal history of malignant melanoma of skin: Secondary | ICD-10-CM | POA: Insufficient documentation

## 2023-07-11 DIAGNOSIS — E1122 Type 2 diabetes mellitus with diabetic chronic kidney disease: Secondary | ICD-10-CM | POA: Insufficient documentation

## 2023-07-11 DIAGNOSIS — G4733 Obstructive sleep apnea (adult) (pediatric): Secondary | ICD-10-CM | POA: Diagnosis not present

## 2023-07-11 DIAGNOSIS — Z01818 Encounter for other preprocedural examination: Secondary | ICD-10-CM | POA: Insufficient documentation

## 2023-07-11 DIAGNOSIS — N189 Chronic kidney disease, unspecified: Secondary | ICD-10-CM | POA: Diagnosis not present

## 2023-07-11 DIAGNOSIS — Z9081 Acquired absence of spleen: Secondary | ICD-10-CM | POA: Diagnosis not present

## 2023-07-11 LAB — HEMOGLOBIN A1C
Hgb A1c MFr Bld: 5.4 % (ref 4.8–5.6)
Mean Plasma Glucose: 108.28 mg/dL

## 2023-07-11 LAB — TYPE AND SCREEN
ABO/RH(D): O POS
Antibody Screen: NEGATIVE

## 2023-07-11 LAB — SURGICAL PCR SCREEN
MRSA, PCR: NEGATIVE
Staphylococcus aureus: NEGATIVE

## 2023-07-11 LAB — BASIC METABOLIC PANEL WITH GFR
Anion gap: 10 (ref 5–15)
BUN: 9 mg/dL (ref 8–23)
CO2: 25 mmol/L (ref 22–32)
Calcium: 9.1 mg/dL (ref 8.9–10.3)
Chloride: 106 mmol/L (ref 98–111)
Creatinine, Ser: 0.82 mg/dL (ref 0.61–1.24)
GFR, Estimated: >60 mL/min (ref >60)
Glucose, Bld: 176 mg/dL — ABNORMAL HIGH (ref 70–99)
Potassium: 3.8 mmol/L (ref 3.5–5.1)
Sodium: 141 mmol/L (ref 135–145)

## 2023-07-11 LAB — CBC
HCT: 43.1 % (ref 39.0–52.0)
Hemoglobin: 14.7 g/dL (ref 13.0–17.0)
MCH: 34.3 pg — ABNORMAL HIGH (ref 26.0–34.0)
MCHC: 34.1 g/dL (ref 30.0–36.0)
MCV: 100.5 fL — ABNORMAL HIGH (ref 80.0–100.0)
Platelets: 473 10*3/uL — ABNORMAL HIGH (ref 150–400)
RBC: 4.29 MIL/uL (ref 4.22–5.81)
RDW: 12.9 % (ref 11.5–15.5)
WBC: 9 10*3/uL (ref 4.0–10.5)
nRBC: 0 % (ref 0.0–0.2)

## 2023-07-11 LAB — GLUCOSE, CAPILLARY: Glucose-Capillary: 163 mg/dL — ABNORMAL HIGH (ref 70–99)

## 2023-07-11 NOTE — Progress Notes (Signed)
 PCP - Dr. Vernona Rieger Cardiologist - Denies  PPM/ICD - Denies Device Orders - n/a Rep Notified - n/a  Chest x-ray - n/a EKG - 07-11-23 Stress Test - 09-17-16 ECHO - 08-28-16 Cardiac Cath - Denies  Sleep Study - Yes CPAP - does not wear states he can not tolerate  Fasting Blood Sugar - 99-110 Checks Blood Sugar: weekly  Last dose of GLP1 agonist-  Denies GLP1 instructions: n/a  Blood Thinner Instructions: Denies Aspirin Instructions: Denies  ERAS Protcol - Clears until 1030 PRE-SURGERY Ensure or G2- G2  COVID TEST- n/a   Anesthesia review: Yes, HTN, DM, CKD, OSA  Patient denies shortness of breath, fever, cough and chest pain at PAT appointment. Patient denies any respiratory issues at this time.    All instructions explained to the patient, with a verbal understanding of the material. Patient agrees to go over the instructions while at home for a better understanding. Patient also instructed to self quarantine after being tested for COVID-19. The opportunity to ask questions was provided.

## 2023-07-14 NOTE — Progress Notes (Signed)
 Anesthesia Chart Review:  Case: [id number] Date/Time: 07/24/23 1315   Procedure: ANTERIOR CERVICAL DECOMPRESSION/DISCECTOMY FUSION 2 LEVELS - ANTERIOR CERVICAL DECOMPRESSION FUSION CERVICAL 3- CERVICAL 4, CERVICAL 4- CERVICAL 5 WITH INSTRUMENTATION AND ALLOGRAFT   Anesthesia type: General   Diagnosis: Stenosis of cervical spine [M48.02]   Pre-op diagnosis: STENOSIS   Location: [facility]OM 05 / [facility]   Surgeons: Estill Bamberg, MD       DISCUSSION: Patient is a 68 year old male scheduled for the above procedure.  History includes smoking, HTN, hypercholesterolemia, DM2, GERD, CKD, melanoma (s/p excision 09/21/21), bipolar 1 disorder, OSA (moderate OSA 02/07/17, intolerant to CPAP), splenectomy.  Referred to ED from [facility] for "chest pain" on 03/31/23. No ED visits or PCP follow-up noted. Left message for Mr. Oser to call me to further discuss more specifics about his symptoms and follow-up, if any.   VS: BP (!) 159/81   Pulse 84   Temp 36.8 C   Resp 18   Ht 5\' 8"  (1.727 m)   Wt 93 kg   SpO2 96%   BMI 31.17 kg/m   PROVIDERS: Doreene Nest, NP is PCP   LABS: Labs reviewed: Acceptable for surgery. (all labs ordered are listed, but only abnormal results are displayed)  Labs Reviewed  CBC - Abnormal; Notable for the following components:      Result Value   MCV 100.5 (*)    MCH 34.3 (*)    Platelets 473 (*)    All other components within normal limits  BASIC METABOLIC PANEL WITH GFR - Abnormal; Notable for the following components:   Glucose, Bld 176 (*)    All other components within normal limits  GLUCOSE, CAPILLARY - Abnormal; Notable for the following components:   Glucose-Capillary 163 (*)    All other components within normal limits  SURGICAL PCR SCREEN  HEMOGLOBIN A1C  TYPE AND SCREEN     IMAGES: CT Chest LCS 02/05/23 ([facility]): IMPRESSION  4 mm subpleural right upper lobe nodule.  Lung-RADS Category 2: Benign appearance or behavior. Nodules with very low  likelihood of becoming a clinically active cancer due to size and growth.  Recommendation - continue annual screening with LDCT in 12 months.    EKG: 07/11/2023: Normal sinus rhythm with sinus arrhythmia When compared with ECG of 11-Mar-2016 22:53, No significant change was found Confirmed by Delrae Rend [phone]) on 07/12/2023 3:20:07 PM   CV: Echo 08/28/16: Study Conclusions  - Left ventricle: The cavity size was normal. Wall thickness was    increased in a pattern of moderate LVH. Systolic function was    normal. The estimated ejection fraction was in the range of 55%    to 60%. Wall motion was normal; there were no regional wall    motion abnormalities. Left ventricular diastolic function    parameters were normal.  - Atrial septum: No defect or patent foramen ovale was identified.    Nuclear stress test 09/17/16: Nuclear stress EF: 52%. There was no ST segment deviation noted during stress. The left ventricular ejection fraction is mildly decreased (45-54%). The study is normal. This is a low risk study.    Past Medical History:  Diagnosis Date   Anxiety and depression    Bipolar 1 disorder (HCC)    Blood transfusion without reported diagnosis    Chronic kidney disease    Depression    GERD (gastroesophageal reflux disease)    Hypercholesterolemia    Hypertension    Melanoma (HCC)  right flank   Sleep apnea    no cpap - can't tolerate   Type 2 diabetes mellitus (HCC)     Past Surgical History:  Procedure Laterality Date   L hand surgery     MELANOMA EXCISION  09/21/2021   R knee arthoroscopy     SPLENECTOMY      MEDICATIONS:  Accu-Chek FastClix Lancets MISC   acetaminophen-codeine (TYLENOL #3) 300-30 MG tablet   amLODipine (NORVASC) 5 MG tablet   atorvastatin (LIPITOR) 20 MG tablet   blood glucose meter kit and supplies KIT   Carboxymeth-Glycerin-Polysorb (REFRESH DIGITAL) 0.5-1-0.5 % SOLN   cyanocobalamin (VITAMIN B12) 1000 MCG tablet    diclofenac (VOLTAREN) 75 MG EC tablet   fluticasone (FLONASE) 50 MCG/ACT nasal spray   glucose blood (ACCU-CHEK GUIDE) test strip   methocarbamol (ROBAXIN) 500 MG tablet   Multiple Vitamins-Minerals (MULTIVITAMIN ADULT) TABS   oxymetazoline (DRISTAN) 0.05 % nasal spray   sertraline (ZOLOFT) 50 MG tablet   tiZANidine (ZANAFLEX) 4 MG tablet   valsartan-hydrochlorothiazide (DIOVAN-HCT) 320-25 MG tablet   No current facility-administered medications for this encounter.    Ella Gun, PA-C Surgical Short Stay/Anesthesiology [facility] Phone [phone] [facility] Phone [phone] 07/14/2023 6:59 PM

## 2023-07-15 ENCOUNTER — Telehealth: Payer: Self-pay

## 2023-07-15 NOTE — Anesthesia Preprocedure Evaluation (Addendum)
 Anesthesia Evaluation  Patient identified by MRN, date of birth, ID band Patient awake    Reviewed: Allergy & Precautions, NPO status , Patient's Chart, lab work & pertinent test results  History of Anesthesia Complications Negative for: history of anesthetic complications  Airway Mallampati: II  TM Distance: >3 FB Neck ROM: Full    Dental  (+) Edentulous Upper, Dental Advisory Given,    Pulmonary sleep apnea and Continuous Positive Airway Pressure Ventilation , Current Smoker   breath sounds clear to auscultation       Cardiovascular hypertension, Pt. on medications (-) angina (-) Past MI and (-) CHF  Rhythm:Regular     Neuro/Psych  PSYCHIATRIC DISORDERS Anxiety Depression Bipolar Disorder   negative neurological ROS     GI/Hepatic ,GERD  Medicated,,  Endo/Other  diabetes  Lab Results      Component                Value               Date                      HGBA1C                   5.4                 07/11/2023             Renal/GU Renal diseaseLab Results      Component                Value               Date                      NA                       141                 07/11/2023                K                        3.8                 07/11/2023                CO2                      25                  07/11/2023                GLUCOSE                  176 (H)             07/11/2023                BUN                      9                   07/11/2023                CREATININE               0.82  07/11/2023                CALCIUM                   9.1                 07/11/2023                GFR                      89.89               01/03/2023                GFRNONAA                 >60                 07/11/2023                Musculoskeletal negative musculoskeletal ROS (+)    Abdominal   Peds  Hematology Lab Results      Component                Value               Date                       WBC                      9.0                 07/11/2023                HGB                      14.7                07/11/2023                HCT                      43.1                07/11/2023                MCV                      100.5 (H)           07/11/2023                PLT                      473 (H)             07/11/2023              Anesthesia Other Findings   Reproductive/Obstetrics                              Anesthesia Physical Anesthesia Plan  ASA: 3  Anesthesia Plan: General   Post-op Pain Management: Ofirmev  IV (intra-op)* and Toradol  IV (intra-op)*   Induction: Intravenous  PONV Risk Score and Plan: 2 and Ondansetron  and Dexamethasone   Airway Management Planned: Oral ETT  Additional Equipment: None  Intra-op Plan:   Post-operative Plan: Extubation in  OR  Informed Consent: I have reviewed the patients History and Physical, chart, labs and discussed the procedure including the risks, benefits and alternatives for the proposed anesthesia with the patient or authorized representative who has indicated his/her understanding and acceptance.     Dental advisory given  Plan Discussed with: CRNA  Anesthesia Plan Comments: (PAT note written 07/15/2023 by Allison Zelenak, PA-C.  )        Anesthesia Quick Evaluation

## 2023-07-15 NOTE — Telephone Encounter (Signed)
 Noted.

## 2023-07-15 NOTE — Telephone Encounter (Signed)
 Copied from CRM [id number]. Topic: General - Other >> Jul 15, 2023 11:53 AM [place] J wrote: Reason for CRM: Patient wanted to let Tretha Fu know that he will be going into surgery soon and he will schedule a follow up once procedure is over.

## 2023-07-24 ENCOUNTER — Ambulatory Visit (HOSPITAL_COMMUNITY): Payer: Self-pay | Admitting: Vascular Surgery

## 2023-07-24 ENCOUNTER — Ambulatory Visit (HOSPITAL_COMMUNITY)

## 2023-07-24 ENCOUNTER — Ambulatory Visit (HOSPITAL_BASED_OUTPATIENT_CLINIC_OR_DEPARTMENT_OTHER)

## 2023-07-24 ENCOUNTER — Ambulatory Visit (HOSPITAL_COMMUNITY)
Admission: RE | Disposition: A | Discharge status: Home / Self Care | Payer: Self-pay | Source: Ambulatory Visit | Attending: Orthopedic Surgery

## 2023-07-24 ENCOUNTER — Encounter (HOSPITAL_COMMUNITY): Payer: Self-pay | Admitting: Orthopedic Surgery

## 2023-07-24 ENCOUNTER — Ambulatory Visit (HOSPITAL_COMMUNITY)
Admission: RE | Admit: 2023-07-24 | Discharge: 2023-07-24 | Disposition: A | Discharge status: Home / Self Care | Source: Ambulatory Visit | Attending: Orthopedic Surgery | Admitting: Orthopedic Surgery

## 2023-07-24 ENCOUNTER — Other Ambulatory Visit: Payer: Self-pay

## 2023-07-24 DIAGNOSIS — G9529 Other cord compression: Secondary | ICD-10-CM | POA: Diagnosis not present

## 2023-07-24 DIAGNOSIS — K219 Gastro-esophageal reflux disease without esophagitis: Secondary | ICD-10-CM | POA: Diagnosis not present

## 2023-07-24 DIAGNOSIS — E1122 Type 2 diabetes mellitus with diabetic chronic kidney disease: Secondary | ICD-10-CM | POA: Insufficient documentation

## 2023-07-24 DIAGNOSIS — F418 Other specified anxiety disorders: Secondary | ICD-10-CM

## 2023-07-24 DIAGNOSIS — N189 Chronic kidney disease, unspecified: Secondary | ICD-10-CM | POA: Diagnosis not present

## 2023-07-24 DIAGNOSIS — I129 Hypertensive chronic kidney disease with stage 1 through stage 4 chronic kidney disease, or unspecified chronic kidney disease: Secondary | ICD-10-CM | POA: Diagnosis not present

## 2023-07-24 DIAGNOSIS — G8929 Other chronic pain: Secondary | ICD-10-CM

## 2023-07-24 DIAGNOSIS — G959 Disease of spinal cord, unspecified: Secondary | ICD-10-CM | POA: Insufficient documentation

## 2023-07-24 DIAGNOSIS — I10 Essential (primary) hypertension: Secondary | ICD-10-CM | POA: Insufficient documentation

## 2023-07-24 DIAGNOSIS — G4733 Obstructive sleep apnea (adult) (pediatric): Secondary | ICD-10-CM | POA: Diagnosis not present

## 2023-07-24 DIAGNOSIS — M50021 Cervical disc disorder at C4-C5 level with myelopathy: Secondary | ICD-10-CM | POA: Diagnosis not present

## 2023-07-24 DIAGNOSIS — E1165 Type 2 diabetes mellitus with hyperglycemia: Secondary | ICD-10-CM

## 2023-07-24 DIAGNOSIS — Z981 Arthrodesis status: Secondary | ICD-10-CM | POA: Diagnosis not present

## 2023-07-24 DIAGNOSIS — M4802 Spinal stenosis, cervical region: Secondary | ICD-10-CM

## 2023-07-24 DIAGNOSIS — F17210 Nicotine dependence, cigarettes, uncomplicated: Secondary | ICD-10-CM

## 2023-07-24 DIAGNOSIS — G4730 Sleep apnea, unspecified: Secondary | ICD-10-CM | POA: Insufficient documentation

## 2023-07-24 DIAGNOSIS — F17200 Nicotine dependence, unspecified, uncomplicated: Secondary | ICD-10-CM | POA: Diagnosis not present

## 2023-07-24 HISTORY — PX: ANTERIOR CERVICAL DECOMP/DISCECTOMY FUSION: SHX1161

## 2023-07-24 LAB — GLUCOSE, CAPILLARY
Glucose-Capillary: 109 mg/dL — ABNORMAL HIGH (ref 70–99)
Glucose-Capillary: 104 mg/dL — ABNORMAL HIGH (ref 70–99)
Glucose-Capillary: 150 mg/dL — ABNORMAL HIGH (ref 70–99)

## 2023-07-24 LAB — ABO/RH: ABO/RH(D): O POS

## 2023-07-24 SURGERY — ANTERIOR CERVICAL DECOMPRESSION/DISCECTOMY FUSION 2 LEVELS
Anesthesia: General | Site: Spine Cervical

## 2023-07-24 MED ORDER — BUPIVACAINE-EPINEPHRINE 0.25% -1:200000 IJ SOLN
INTRAMUSCULAR | Status: DC | PRN
  Administered 2023-07-24: 6 mL

## 2023-07-24 MED ORDER — FENTANYL CITRATE (PF) 250 MCG/5ML IJ SOLN
INTRAMUSCULAR | Status: DC | PRN
  Administered 2023-07-24: 50 ug via INTRAVENOUS
  Administered 2023-07-24: 100 ug via INTRAVENOUS
  Administered 2023-07-24 (×2): 50 ug via INTRAVENOUS

## 2023-07-24 MED ORDER — HYDROCODONE-ACETAMINOPHEN 5-325 MG PO TABS: 1.0000 | ORAL_TABLET | Freq: Four times a day (QID) | ORAL | 0 refills | Status: DC | PRN

## 2023-07-24 MED ORDER — LACTATED RINGERS IV SOLN: INTRAVENOUS | Status: DC | PRN

## 2023-07-24 MED ORDER — ACETAMINOPHEN 10 MG/ML IV SOLN: 1000.0000 mg | Freq: Once | INTRAVENOUS | Status: DC | PRN

## 2023-07-24 MED ORDER — POVIDONE-IODINE 7.5 % EX SOLN: Freq: Once | CUTANEOUS | Status: DC

## 2023-07-24 MED ORDER — CEFAZOLIN SODIUM-DEXTROSE 2-4 GM/100ML-% IV SOLN
2.0000 g | INTRAVENOUS | Status: AC
  Administered 2023-07-24: 2 g via INTRAVENOUS

## 2023-07-24 MED ORDER — MIDAZOLAM HCL 2 MG/2ML IJ SOLN
INTRAMUSCULAR | Status: DC | PRN
  Administered 2023-07-24: 2 mg via INTRAVENOUS

## 2023-07-24 MED ORDER — CEFAZOLIN SODIUM-DEXTROSE 2-4 GM/100ML-% IV SOLN
INTRAVENOUS | Status: AC
Start: 2023-07-24 — End: 2023-07-24
  Filled 2023-07-24: qty 100

## 2023-07-24 MED ORDER — FENTANYL CITRATE (PF) 100 MCG/2ML IJ SOLN
INTRAMUSCULAR | Status: AC
  Filled 2023-07-24: qty 2

## 2023-07-24 MED ORDER — DEXAMETHASONE SODIUM PHOSPHATE 10 MG/ML IJ SOLN
INTRAMUSCULAR | Status: DC | PRN
Start: 2023-07-24 — End: 2023-07-24
  Administered 2023-07-24: 5 mg via INTRAVENOUS

## 2023-07-24 MED ORDER — EPHEDRINE SULFATE-NACL 50-0.9 MG/10ML-% IV SOSY
PREFILLED_SYRINGE | INTRAVENOUS | Status: DC | PRN
  Administered 2023-07-24: 5 mg via INTRAVENOUS

## 2023-07-24 MED ORDER — FENTANYL CITRATE (PF) 250 MCG/5ML IJ SOLN
INTRAMUSCULAR | Status: AC
  Filled 2023-07-24: qty 5

## 2023-07-24 MED ORDER — MIDAZOLAM HCL 2 MG/2ML IJ SOLN
INTRAMUSCULAR | Status: AC
  Filled 2023-07-24: qty 2

## 2023-07-24 MED ORDER — ROCURONIUM BROMIDE 10 MG/ML (PF) SYRINGE
PREFILLED_SYRINGE | INTRAVENOUS | Status: AC
  Filled 2023-07-24: qty 10

## 2023-07-24 MED ORDER — OXYCODONE HCL 5 MG PO TABS: 5.0000 mg | ORAL_TABLET | Freq: Once | ORAL | Status: DC | PRN

## 2023-07-24 MED ORDER — METHOCARBAMOL 500 MG PO TABS: 500.0000 mg | ORAL_TABLET | Freq: Four times a day (QID) | ORAL | 0 refills | Status: DC | PRN

## 2023-07-24 MED ORDER — LIDOCAINE 2% (20 MG/ML) 5 ML SYRINGE
INTRAMUSCULAR | Status: AC
  Filled 2023-07-24: qty 5

## 2023-07-24 MED ORDER — 0.9 % SODIUM CHLORIDE (POUR BTL) OPTIME
TOPICAL | Status: DC | PRN
  Administered 2023-07-24: 1000 mL

## 2023-07-24 MED ORDER — INSULIN ASPART 100 UNIT/ML IJ SOLN: 0.0000 [IU] | INTRAMUSCULAR | Status: DC | PRN

## 2023-07-24 MED ORDER — ALBUMIN HUMAN 5 % IV SOLN: INTRAVENOUS | Status: DC | PRN

## 2023-07-24 MED ORDER — ONDANSETRON HCL 4 MG/2ML IJ SOLN
INTRAMUSCULAR | Status: AC
  Filled 2023-07-24: qty 2

## 2023-07-24 MED ORDER — ACETAMINOPHEN 10 MG/ML IV SOLN
INTRAVENOUS | Status: DC | PRN
  Administered 2023-07-24: 1000 mg via INTRAVENOUS

## 2023-07-24 MED ORDER — ROCURONIUM BROMIDE 10 MG/ML (PF) SYRINGE
PREFILLED_SYRINGE | INTRAVENOUS | Status: DC | PRN
  Administered 2023-07-24: 10 mg via INTRAVENOUS
  Administered 2023-07-24: 70 mg via INTRAVENOUS
  Administered 2023-07-24: 20 mg via INTRAVENOUS

## 2023-07-24 MED ORDER — PHENYLEPHRINE HCL-NACL 20-0.9 MG/250ML-% IV SOLN
INTRAVENOUS | Status: DC | PRN
  Administered 2023-07-24: 20 ug/min via INTRAVENOUS

## 2023-07-24 MED ORDER — PHENYLEPHRINE 80 MCG/ML (10ML) SYRINGE FOR IV PUSH (FOR BLOOD PRESSURE SUPPORT)
PREFILLED_SYRINGE | INTRAVENOUS | Status: DC | PRN
  Administered 2023-07-24: 80 ug via INTRAVENOUS
  Administered 2023-07-24: 160 ug via INTRAVENOUS

## 2023-07-24 MED ORDER — OXYCODONE HCL 5 MG/5ML PO SOLN: 5.0000 mg | Freq: Once | ORAL | Status: DC | PRN

## 2023-07-24 MED ORDER — CHLORHEXIDINE GLUCONATE 0.12 % MT SOLN: 15.0000 mL | Freq: Once | OROMUCOSAL | Status: AC

## 2023-07-24 MED ORDER — THROMBIN 20000 UNITS EX KIT
PACK | CUTANEOUS | Status: DC | PRN
  Administered 2023-07-24: 20 mL via TOPICAL

## 2023-07-24 MED ORDER — ONDANSETRON HCL 4 MG/2ML IJ SOLN
INTRAMUSCULAR | Status: DC | PRN
  Administered 2023-07-24: 4 mg via INTRAVENOUS

## 2023-07-24 MED ORDER — LIDOCAINE 2% (20 MG/ML) 5 ML SYRINGE
INTRAMUSCULAR | Status: DC | PRN
  Administered 2023-07-24: 60 mg via INTRAVENOUS

## 2023-07-24 MED ORDER — KETAMINE HCL 10 MG/ML IJ SOLN
INTRAMUSCULAR | Status: DC | PRN
  Administered 2023-07-24: 10 mg via INTRAVENOUS
  Administered 2023-07-24 (×2): 20 mg via INTRAVENOUS

## 2023-07-24 MED ORDER — PROPOFOL 10 MG/ML IV BOLUS
INTRAVENOUS | Status: DC | PRN
  Administered 2023-07-24: 200 mg via INTRAVENOUS

## 2023-07-24 MED ORDER — CHLORHEXIDINE GLUCONATE 0.12 % MT SOLN
OROMUCOSAL | Status: AC
  Administered 2023-07-24: 15 mL via OROMUCOSAL
  Filled 2023-07-24: qty 15

## 2023-07-24 MED ORDER — ACETAMINOPHEN 10 MG/ML IV SOLN
INTRAVENOUS | Status: AC
  Filled 2023-07-24: qty 100

## 2023-07-24 MED ORDER — LACTATED RINGERS IV SOLN: INTRAVENOUS | Status: DC

## 2023-07-24 MED ORDER — SUGAMMADEX SODIUM 200 MG/2ML IV SOLN
INTRAVENOUS | Status: DC | PRN
  Administered 2023-07-24: 200 mg via INTRAVENOUS

## 2023-07-24 MED ORDER — KETAMINE HCL 50 MG/5ML IJ SOSY
PREFILLED_SYRINGE | INTRAMUSCULAR | Status: AC
  Filled 2023-07-24: qty 5

## 2023-07-24 MED ORDER — ORAL CARE MOUTH RINSE: 15.0000 mL | Freq: Once | OROMUCOSAL | Status: AC

## 2023-07-24 MED ORDER — DEXAMETHASONE SODIUM PHOSPHATE 10 MG/ML IJ SOLN
INTRAMUSCULAR | Status: AC
  Filled 2023-07-24: qty 1

## 2023-07-24 MED ORDER — BUPIVACAINE-EPINEPHRINE (PF) 0.25% -1:200000 IJ SOLN
INTRAMUSCULAR | Status: AC
  Filled 2023-07-24: qty 30

## 2023-07-24 MED ORDER — FENTANYL CITRATE (PF) 100 MCG/2ML IJ SOLN
25.0000 ug | INTRAMUSCULAR | Status: DC | PRN
  Administered 2023-07-24: 25 ug via INTRAVENOUS

## 2023-07-24 MED ORDER — THROMBIN 20000 UNITS EX SOLR
CUTANEOUS | Status: AC
  Filled 2023-07-24: qty 20000

## 2023-07-24 SURGICAL SUPPLY — 63 items
BAG COUNTER SPONGE SURGICOUNT (BAG) ×1 IMPLANT
BENZOIN TINCTURE PRP APPL 2/3 (GAUZE/BANDAGES/DRESSINGS) ×1 IMPLANT
BIT DRILL NEURO 2X3.1 SFT TUCH (MISCELLANEOUS) ×1 IMPLANT
BIT DRILL SRG 14X2.2XFLT CHK (BIT) IMPLANT
BLADE CLIPPER SURG (BLADE) ×1 IMPLANT
BLADE SURG 15 STRL LF DISP TIS (BLADE) ×1 IMPLANT
BONE VIVIGEN FORMABLE 1.3CC (Bone Implant) ×2 IMPLANT
CAGE LORDOTIC TC SM 8 6D (Cage) IMPLANT
COLLAR CERV LO CONTOUR FIRM DE (SOFTGOODS) IMPLANT
CORD BIPOLAR FORCEPS 12FT (ELECTRODE) ×1 IMPLANT
COVER SURGICAL LIGHT HANDLE (MISCELLANEOUS) ×1 IMPLANT
DRAIN JACKSON RD 7FR 3/32 (WOUND CARE) IMPLANT
DRAPE C-ARM 42X72 X-RAY (DRAPES) ×1 IMPLANT
DRAPE POUCH INSTRU U-SHP 10X18 (DRAPES) ×1 IMPLANT
DRAPE SURG 17X23 STRL (DRAPES) ×4 IMPLANT
DURAPREP 26ML APPLICATOR (WOUND CARE) ×1 IMPLANT
ELECT COATED BLADE 2.86 ST (ELECTRODE) ×1 IMPLANT
ELECTRODE REM PT RTRN 9FT ADLT (ELECTROSURGICAL) ×1 IMPLANT
EVACUATOR SILICONE 100CC (DRAIN) IMPLANT
GAUZE 4X4 16PLY ~~LOC~~+RFID DBL (SPONGE) ×1 IMPLANT
GAUZE SPONGE 4X4 12PLY STRL (GAUZE/BANDAGES/DRESSINGS) ×1 IMPLANT
GLOVE BIO SURGEON STRL SZ 6.5 (GLOVE) ×1 IMPLANT
GLOVE BIO SURGEON STRL SZ8 (GLOVE) ×1 IMPLANT
GLOVE BIOGEL PI IND STRL 7.0 (GLOVE) ×2 IMPLANT
GLOVE BIOGEL PI IND STRL 8 (GLOVE) ×1 IMPLANT
GLOVE SURG ENC MOIS LTX SZ6.5 (GLOVE) ×1 IMPLANT
GOWN STRL REUS W/ TWL LRG LVL3 (GOWN DISPOSABLE) ×1 IMPLANT
GOWN STRL REUS W/ TWL XL LVL3 (GOWN DISPOSABLE) ×1 IMPLANT
GRAFT BNE MATRIX VG FRMBL SM 1 (Bone Implant) IMPLANT
IV CATH 14GX2 1/4 (CATHETERS) ×1 IMPLANT
KIT BASIN OR (CUSTOM PROCEDURE TRAY) ×1 IMPLANT
KIT TURNOVER KIT B (KITS) ×1 IMPLANT
MANIFOLD NEPTUNE II (INSTRUMENTS) ×1 IMPLANT
NDL PRECISIONGLIDE 27X1.5 (NEEDLE) ×1 IMPLANT
NDL SPNL 20GX3.5 QUINCKE YW (NEEDLE) ×1 IMPLANT
NEEDLE PRECISIONGLIDE 27X1.5 (NEEDLE) ×1 IMPLANT
NEEDLE SPNL 20GX3.5 QUINCKE YW (NEEDLE) ×1 IMPLANT
NS IRRIG 1000ML POUR BTL (IV SOLUTION) ×1 IMPLANT
PACK ORTHO CERVICAL (CUSTOM PROCEDURE TRAY) ×1 IMPLANT
PAD ARMBOARD POSITIONER FOAM (MISCELLANEOUS) ×2 IMPLANT
PATTIES SURGICAL .5 X.5 (GAUZE/BANDAGES/DRESSINGS) IMPLANT
PATTIES SURGICAL .5 X1 (DISPOSABLE) ×1 IMPLANT
PIN DISTRACTION 14 (PIN) IMPLANT
PLATE SKYLINE 2 LEVEL 34MM (Plate) IMPLANT
POSITIONER HEAD DONUT 9IN (MISCELLANEOUS) ×1 IMPLANT
SCREW SKYLINE VAR OS 14MM (Screw) IMPLANT
SPIKE FLUID TRANSFER (MISCELLANEOUS) ×1 IMPLANT
SPONGE INTESTINAL PEANUT (DISPOSABLE) ×1 IMPLANT
SPONGE SURGIFOAM ABS GEL 100 (HEMOSTASIS) ×1 IMPLANT
STRIP CLOSURE SKIN 1/2X4 (GAUZE/BANDAGES/DRESSINGS) ×1 IMPLANT
SURGIFLO W/THROMBIN 8M KIT (HEMOSTASIS) IMPLANT
SUT MNCRL AB 4-0 PS2 18 (SUTURE) ×1 IMPLANT
SUT SILK 4-0 18XBRD TIE 12 (SUTURE) IMPLANT
SUT VIC AB 2-0 CT2 18 VCP726D (SUTURE) ×1 IMPLANT
SYR BULB IRRIG 60ML STRL (SYRINGE) ×1 IMPLANT
SYR CONTROL 10ML LL (SYRINGE) ×3 IMPLANT
TAPE CLOTH 4X10 WHT NS (GAUZE/BANDAGES/DRESSINGS) ×1 IMPLANT
TAPE CLOTH SURG 4X10 WHT LF (GAUZE/BANDAGES/DRESSINGS) IMPLANT
TAPE UMBILICAL 1/8X30 (MISCELLANEOUS) ×2 IMPLANT
TOWEL GREEN STERILE (TOWEL DISPOSABLE) ×1 IMPLANT
TOWEL GREEN STERILE FF (TOWEL DISPOSABLE) ×1 IMPLANT
WATER STERILE IRR 1000ML POUR (IV SOLUTION) ×1 IMPLANT
YANKAUER SUCT BULB TIP NO VENT (SUCTIONS) ×1 IMPLANT

## 2023-07-24 NOTE — Anesthesia Procedure Notes (Signed)
 Procedure Name: Intubation Date/Time: 07/24/2023 2:47 PM  Performed by: Linard Reno, CRNAPre-anesthesia Checklist: Patient identified, Emergency Drugs available, Suction available and Patient being monitored Patient Re-evaluated:Patient Re-evaluated prior to induction Oxygen Delivery Method: Circle System Utilized Preoxygenation: Pre-oxygenation with 100% oxygen Induction Type: IV induction Ventilation: Mask ventilation without difficulty Laryngoscope Size: Mac and 4 Grade View: Grade I Tube type: Oral Tube size: 8.0 mm Number of attempts: 1 Airway Equipment and Method: Stylet and Oral airway Placement Confirmation: ETT inserted through vocal cords under direct vision, positive ETCO2 and breath sounds checked- equal and bilateral Secured at: 23 cm Tube secured with: Tape Dental Injury: Teeth and Oropharynx as per pre-operative assessment

## 2023-07-24 NOTE — H&P (Signed)
 PREOPERATIVE H&P  Chief Complaint: Upper and lower extremity weakness, deterioration in fine motor skills and balance  HPI: Kenneth [name] is a 68 y.o. male who presents with ongoing and progressive symptoms as outlined above  MRI reveals spinal cord compression, including myelomalacia, associated with C3-4 and C4-5  Patient has failed multiple forms of conservative care and continues to have pain (see office notes for additional details regarding the patient's full course of treatment)  Past Medical History:  Diagnosis Date   Anxiety and depression    Bipolar 1 disorder (HCC)    Blood transfusion without reported diagnosis    Chronic kidney disease    Depression    GERD (gastroesophageal reflux disease)    Hypercholesterolemia    Hypertension    Melanoma (HCC)    right flank   Sleep apnea    no cpap - can't tolerate   Type 2 diabetes mellitus (HCC)    Past Surgical History:  Procedure Laterality Date   L hand surgery     MELANOMA EXCISION  09/21/2021   R knee arthoroscopy     SPLENECTOMY     Social History   Socioeconomic History   Marital status: Divorced    Spouse name: Not on file   Number of children: Not on file   Years of education: Not on file   Highest education level: Not on file  Occupational History   Not on file  Tobacco Use   Smoking status: Every Day    Current packs/day: 1.00    Average packs/day: 1 pack/day for 89.3 years (89.3 ttl pk-yrs)    Types: Cigarettes    Start date: 45   Smokeless tobacco: Never  Vaping Use   Vaping status: Never Used  Substance and Sexual Activity   Alcohol use: Yes    Comment: occassionaly 1/month   Drug use: No   Sexual activity: Not Currently  Other Topics Concern   Not on file  Social History Narrative   Single.   1 daughter, 1 grandchild.   Retired. Once worked in [employer].   Enjoys sports, racing, traveling.    Social Drivers of Corporate investment banker Strain: Low Risk  (05/21/2022)    Overall Financial Resource Strain (CARDIA)    Difficulty of Paying Living Expenses: Not hard at all  Food Insecurity: No Food Insecurity (06/24/2023)   Hunger Vital Sign    Worried About Running Out of Food in the Last Year: Never true    Ran Out of Food in the Last Year: Never true  Transportation Needs: No Transportation Needs (06/24/2023)   PRAPARE - Administrator, Civil Service (Medical): No    Lack of Transportation (Non-Medical): No  Physical Activity: Inactive (06/24/2023)   Exercise Vital Sign    Days of Exercise per Week: 0 days    Minutes of Exercise per Session: 0 min  Stress: No Stress Concern Present (06/24/2023)   [employer] of Occupational Health - Occupational Stress Questionnaire    Feeling of Stress : Not at all  Social Connections: Socially Isolated (06/24/2023)   Social Connection and Isolation Panel [NHANES]    Frequency of Communication with Friends and Family: More than three times a week    Frequency of Social Gatherings with Friends and Family: Three times a week    Attends Religious Services: Never    Active Member of Clubs or Organizations: No    Attends Banker Meetings: Never  Marital Status: Divorced   Family History  Problem Relation Age of Onset   Hypertension Mother    Mental illness Mother    Diabetes Mother    Hypertension Father    Lung cancer Father    Diabetes Paternal Grandmother    Colon cancer Maternal Uncle    Esophageal cancer Neg Hx    Liver cancer Neg Hx    Pancreatic cancer Neg Hx    Rectal cancer Neg Hx    Stomach cancer Neg Hx    No Known Allergies Prior to Admission medications   Medication Sig Start Date End Date Taking? Authorizing Provider  acetaminophen -codeine (TYLENOL  #3) 300-30 MG tablet Take 1-2 tablets by mouth every 6 (six) hours as needed for moderate pain (pain score 4-6).   Yes [provider]  amLODipine  (NORVASC ) 5 MG tablet TAKE ONE TABLET DAILY FOR blood PRESSURE  08/20/22  Yes Clark, Katherine K, NP  atorvastatin  (LIPITOR) 20 MG tablet TAKE ONE TABLET BY MOUTH EVERY evening FOR cholesterol 02/16/23  Yes Clark, Katherine K, NP  Carboxymeth-Glycerin-Polysorb (REFRESH DIGITAL) 0.5-1-0.5 % SOLN Place 1 drop into both eyes daily as needed (dry eyes).   Yes [provider]  cyanocobalamin  (VITAMIN B12) 1000 MCG tablet Take 1,000 mcg by mouth daily.   Yes [provider]  diclofenac  (VOLTAREN ) 75 MG EC tablet TAKE ONE TABLET EACH DAY AS NEEDEd for moderate pain 06/27/23  Yes Clark, Katherine K, NP  fluticasone  (FLONASE ) 50 MCG/ACT nasal spray ONE SPRAY IN EACH NOSTRIL TWICE DAILY AS NEEDED FOR ALLERGIES OR RHINITIS 03/30/23  Yes Clark, Katherine K, NP  Multiple Vitamins-Minerals (MULTIVITAMIN ADULT) TABS Take 1 tablet by mouth daily.   Yes [provider]  oxymetazoline (DRISTAN) 0.05 % nasal spray Place 1 spray into both nostrils every 3 (three) days.   Yes [provider]  sertraline  (ZOLOFT ) 50 MG tablet TAKE ONE TABLET EACH DAY FOR ANXIETY AND DEPRESSION 05/01/23  Yes Clark, Katherine K, NP  tiZANidine (ZANAFLEX) 4 MG tablet Take 4 mg by mouth every 6 (six) hours as needed for muscle spasms. 06/17/23  Yes [provider]  valsartan -hydrochlorothiazide  (DIOVAN -HCT) 320-25 MG tablet TAKE ONE TABLET DAILY FOR BLOOD PRESSURE 01/15/23  Yes Clark, Katherine K, NP  Accu-Chek FastClix Lancets MISC USE TO CHECK BLOOD SUGAR UP TO 4 TIMES A DAY 03/07/22   Gabriel John, NP  blood glucose meter kit and supplies KIT Dispense based on patient and insurance preference. Use up to four times daily as directed. (FOR ICD-9 250.00, 250.01). 11/18/18   Gabriel John, NP  glucose blood (ACCU-CHEK GUIDE) test strip USE TO CHECK UP TO 4 TIMES A DAY 03/07/22   Clark, Katherine K, NP  methocarbamol  (ROBAXIN ) 500 MG tablet TAKE ONE TABLET BY MOUTH DAILY AS NEEDED FOR muscle SPASMS Patient not taking: Reported on 07/08/2023 03/22/23   Gabriel John, NP     All other systems have been reviewed and were otherwise negative with the exception of those mentioned in the HPI and as above.  Physical Exam: Vitals:   07/24/23 0950  BP: (!) 149/82  Pulse: 64  Resp: 18  Temp: 98.1 F (36.7 C)  SpO2: 93%    Body mass index is 31.17 kg/m.  General: Alert, no acute distress Cardiovascular: No pedal edema Respiratory: No cyanosis, no use of accessory musculature Skin: No lesions in the area of chief complaint Neurologic: Sensation intact distally Psychiatric: Patient is competent for consent with normal mood and affect Lymphatic:  No axillary or cervical lymphadenopathy   Assessment/Plan: Progressive cervical myelopathy, with spinal cord compression at C3-4 and C4-5 Plan for Procedure(s): ANTERIOR CERVICAL DECOMPRESSION/DISCECTOMY FUSION C3/4, C4/5   Murel Arlington, MD 07/24/2023 10:45 AM

## 2023-07-24 NOTE — Transfer of Care (Signed)
 Immediate Anesthesia Transfer of Care Note  Patient: Kenneth [name]  Procedure(s) Performed: ANTERIOR CERVICAL DECOMPRESSION FUSION CERVICAL 3- CERVICAL 4, CERVICAL 4- CERVICAL 5 WITH INSTRUMENTATION AND ALLOGRAFT (Spine Cervical)  Patient Location: PACU  Anesthesia Type:General  Level of Consciousness: awake and alert   Airway & Oxygen Therapy: Patient Spontanous Breathing and Patient connected to face mask oxygen  Post-op Assessment: Report given to RN, Post -op Vital signs reviewed and stable, and Patient moving all extremities X 4  Post vital signs: Reviewed and stable  Last Vitals:  Vitals Value Taken Time  BP 140/82 07/24/23 1705  Temp    Pulse 85 07/24/23 1709  Resp 19 07/24/23 1709  SpO2 96 % 07/24/23 1709  Vitals shown include unfiled device data.  Last Pain:  Vitals:   07/24/23 1045  TempSrc:   PainSc: 0-No pain         Complications: No notable events documented.

## 2023-07-24 NOTE — Op Note (Signed)
 PATIENT NAME: Kenneth [name]  MEDICAL RECORD NO.:   [id number]   DATE OF BIRTH:  [birth year]    DATE OF PROCEDURE:  07/24/2023                                OPERATIVE REPORT     PREOPERATIVE DIAGNOSES: 1. Progressive cervical myelopathy 2. Spinal cord compression spanning C3/4 and C4/5   POSTOPERATIVE DIAGNOSES: 1. Progressive cervical myelopathy 2. Spinal cord compression spanning C3/4 and C4/5   PROCEDURE: 1. Anterior cervical decompression and fusion C3/4, C4/5 2. Placement of anterior instrumentation, C3-C5 (of note, the anterior plate and screws, were separate from, not integral to, the intervertebral spacers) 3. Insertion of interbody device x 2 (8mm Titan intervertebral spacers). 4. Intraoperative use of fluoroscopy. 5. Use of morselized allograft - ViviGen.   SURGEON:  Virl Grimes, MD   ASSISTANT:  Geraline Knapp, PA-C.   ANESTHESIA:  General endotracheal anesthesia.   COMPLICATIONS:  None.   DISPOSITION:  Stable.   ESTIMATED BLOOD LOSS:  Minimal.   INDICATIONS FOR SURGERY:  Briefly, Kenneth [name] is a pleasant 68 y.o. year-old male, who did present to me with progressive cervical myelopathy.  Specifically, he was noted to have upper and lower extremity weakness, as well as deterioration in balance and fine motor skills.  The patient's MRI did reveal the findings noted above, most notably, cord compression at C3-4 and C4-5.  Given the patient's prominent progressive myelopathic symptoms, and MRI findings, we did discuss proceeding with the procedure noted above.  The patient was fully aware of the risks and limitations of surgery as outlined in my preoperative note.   OPERATIVE DETAILS:  On 07/24/2023, the patient was brought to surgery and general endotracheal anesthesia was administered. The patient was placed supine on the hospital bed. The neck was gently extended.  All bony prominences were meticulously padded.  The neck was prepped and draped in the usual sterile  fashion.  At this point, I did make a left-sided transverse incision.  The platysma was incised.  A Smith-Robinson approach was used and the anterior spine was identified. A self-retaining retractor was placed.  I then subperiosteally exposed the vertebral bodies from C3-C5.  Caspar pins were then placed into the C4 and C5 vertebral bodies and distraction was applied.  A thorough and complete C4/5 intervertebral diskectomy was performed.  The posterior longitudinal ligament was identified and entered using a nerve hook.  I then used #1 followed by #2 Kerrison to perform a thorough and complete intervertebral diskectomy.  The spinal canal was thoroughly decompressed, as was the right and left neuroforamena. The endplates were then prepared and the appropriate-sized intervertebral spacer was then packed with ViviGen and tamped into position in the usual fashion.  The lower Caspar pin was then removed and placed into the C3 vertebral body and once again, distraction was applied across the C3/4 intervertebral space.  I then again performed a thorough and complete diskectomy, thoroughly decompressing the spinal canal and bilateral neuroforamena. After preparing the endplates, the appropriate-sized intervertebral spacer was packed with ViviGen and tamped into position. The Caspar pins then were removed and bone wax was placed in their place.  The appropriate-sized anterior cervical plate was placed over the anterior spine.  14 mm variable angle screws were placed, 2 in each vertebral body from C3-C5 for a total of 6 vertebral body screws.  The screws were then locked to the plate  using the Cam locking mechanism.  I was very pleased with the final fluoroscopic images.  The wound was then irrigated.  The wound was then explored for any undue bleeding and there was no bleeding noted. The wound was then closed in layers using 2-0 Vicryl, followed by 4-0 Monocryl.  Benzoin and Steri-Strips were applied, followed by  sterile dressing.  All instrument counts were correct at the termination of the procedure.   Of note, Geraline Knapp, PA-C, was my assistant throughout surgery, and did aid in retraction, suctioning, placement of the hardware, and closure from start to finish.     Virl Grimes, MD

## 2023-07-25 ENCOUNTER — Encounter (HOSPITAL_COMMUNITY): Payer: Self-pay | Admitting: Orthopedic Surgery

## 2023-07-25 MED FILL — Thrombin For Soln 20000 Unit: CUTANEOUS | Qty: 1 | Status: AC

## 2023-07-27 NOTE — Anesthesia Postprocedure Evaluation (Signed)
 Anesthesia Post Note  Patient: KUYPER COCKETT  Procedure(s) Performed: ANTERIOR CERVICAL DECOMPRESSION FUSION CERVICAL 3- CERVICAL 4, CERVICAL 4- CERVICAL 5 WITH INSTRUMENTATION AND ALLOGRAFT (Spine Cervical)     Patient location during evaluation: PACU Anesthesia Type: General Level of consciousness: awake and alert Pain management: pain level controlled Vital Signs Assessment: post-procedure vital signs reviewed and stable Respiratory status: spontaneous breathing, nonlabored ventilation and respiratory function stable Cardiovascular status: blood pressure returned to baseline and stable Postop Assessment: no apparent nausea or vomiting Anesthetic complications: no   No notable events documented.  Last Vitals:  Vitals:   07/24/23 1730 07/24/23 1745  BP: 132/70 (!) 158/74  Pulse: 70 81  Resp: 13 17  Temp:    SpO2: 93% 95%    Last Pain:  Vitals:   07/24/23 1045  TempSrc:   PainSc: 0-No pain                 Breck Hollinger

## 2023-08-19 DIAGNOSIS — Z008 Encounter for other general examination: Secondary | ICD-10-CM | POA: Diagnosis not present

## 2023-08-19 DIAGNOSIS — R52 Pain, unspecified: Secondary | ICD-10-CM | POA: Diagnosis not present

## 2023-09-01 DIAGNOSIS — M542 Cervicalgia: Secondary | ICD-10-CM | POA: Diagnosis not present

## 2023-10-13 DIAGNOSIS — M4696 Unspecified inflammatory spondylopathy, lumbar region: Secondary | ICD-10-CM | POA: Diagnosis not present

## 2023-10-13 DIAGNOSIS — M48062 Spinal stenosis, lumbar region with neurogenic claudication: Secondary | ICD-10-CM | POA: Diagnosis not present

## 2023-10-13 DIAGNOSIS — M542 Cervicalgia: Secondary | ICD-10-CM | POA: Diagnosis not present

## 2023-10-28 DIAGNOSIS — D485 Neoplasm of uncertain behavior of skin: Secondary | ICD-10-CM | POA: Diagnosis not present

## 2023-10-28 DIAGNOSIS — Z85820 Personal history of malignant melanoma of skin: Secondary | ICD-10-CM | POA: Diagnosis not present

## 2023-10-28 DIAGNOSIS — Z08 Encounter for follow-up examination after completed treatment for malignant neoplasm: Secondary | ICD-10-CM | POA: Diagnosis not present

## 2023-10-28 DIAGNOSIS — I872 Venous insufficiency (chronic) (peripheral): Secondary | ICD-10-CM | POA: Diagnosis not present

## 2023-10-28 DIAGNOSIS — D225 Melanocytic nevi of trunk: Secondary | ICD-10-CM | POA: Diagnosis not present

## 2023-10-28 DIAGNOSIS — Z1283 Encounter for screening for malignant neoplasm of skin: Secondary | ICD-10-CM | POA: Diagnosis not present

## 2023-10-29 DIAGNOSIS — M545 Low back pain, unspecified: Secondary | ICD-10-CM | POA: Diagnosis not present

## 2023-11-07 ENCOUNTER — Encounter: Payer: Self-pay | Admitting: Pharmacist

## 2023-11-07 DIAGNOSIS — M4807 Spinal stenosis, lumbosacral region: Secondary | ICD-10-CM | POA: Diagnosis not present

## 2023-11-07 NOTE — Progress Notes (Signed)
 Pharmacy Quality Measure Review  This patient is appearing on a report for being at risk of failing the adherence measure for hypertension (ACEi/ARB) medications this calendar year.   Medication: valsartan /hydrochlorothiazide   Last fill date: 06/03/23 for 90 day supply  Insurance report was not up to date. No action needed at this time.   Medication has been refilled as of 09/17/23 x90 ds.  One additional refill remaining No PCP visit scheduled prior to running out of refills. Will need new Rx ~December

## 2023-11-12 ENCOUNTER — Ambulatory Visit (HOSPITAL_COMMUNITY)
Admission: EM | Admit: 2023-11-12 | Discharge: 2023-11-12 | Disposition: A | Discharge status: Home / Self Care | Attending: Physician Assistant | Admitting: Physician Assistant

## 2023-11-12 ENCOUNTER — Ambulatory Visit (INDEPENDENT_AMBULATORY_CARE_PROVIDER_SITE_OTHER)

## 2023-11-12 ENCOUNTER — Encounter (HOSPITAL_COMMUNITY): Payer: Self-pay

## 2023-11-12 DIAGNOSIS — S300XXA Contusion of lower back and pelvis, initial encounter: Secondary | ICD-10-CM

## 2023-11-12 DIAGNOSIS — M25552 Pain in left hip: Secondary | ICD-10-CM | POA: Diagnosis not present

## 2023-11-12 DIAGNOSIS — W19XXXA Unspecified fall, initial encounter: Secondary | ICD-10-CM | POA: Diagnosis not present

## 2023-11-12 DIAGNOSIS — M47816 Spondylosis without myelopathy or radiculopathy, lumbar region: Secondary | ICD-10-CM | POA: Diagnosis not present

## 2023-11-12 DIAGNOSIS — M51369 Other intervertebral disc degeneration, lumbar region without mention of lumbar back pain or lower extremity pain: Secondary | ICD-10-CM | POA: Diagnosis not present

## 2023-11-12 DIAGNOSIS — Z043 Encounter for examination and observation following other accident: Secondary | ICD-10-CM | POA: Diagnosis not present

## 2023-11-12 DIAGNOSIS — S7002XA Contusion of left hip, initial encounter: Secondary | ICD-10-CM | POA: Diagnosis not present

## 2023-11-12 LAB — POCT URINALYSIS DIP (MANUAL ENTRY)
Bilirubin, UA: NEGATIVE
Blood, UA: NEGATIVE
Glucose, UA: NEGATIVE mg/dL
Leukocytes, UA: NEGATIVE
Nitrite, UA: NEGATIVE
Spec Grav, UA: 1.02 (ref 1.010–1.025)
Urobilinogen, UA: 0.2 U/dL
pH, UA: 6 (ref 5.0–8.0)

## 2023-11-12 MED ORDER — LIDOCAINE 5 % EX PTCH: 1.0000 | MEDICATED_PATCH | CUTANEOUS | 0 refills | Status: AC

## 2023-11-12 NOTE — ED Triage Notes (Signed)
 Pt states slipped and fell on his wooden steps hitting lt lower back around noon today. States has bruising.

## 2023-11-12 NOTE — Discharge Instructions (Addendum)
 Your x-rays showed arthritis but did not show any evidence of a broken or out of place bone.  This is great news.  Your urine did not have any blood in it.  Apply lidocaine  patch to help with your pain.  You can use over-the-counter medicine such as Tylenol  for additional pain relief.  I also recommend heat and gentle stretch.  Follow-up with your orthopedic provider as scheduled.  If anything worsens and you have blood in your urine, difficulty going to the bathroom, blood in your stool, severe pain, extensive bruising, weakness in your legs, going to the bathroom understand without noticing it you need to be seen immediately.

## 2023-11-12 NOTE — ED Provider Notes (Signed)
 [facility]    CSN: [id number] Arrival date & time: 11/12/23  1909      History   Chief Complaint Chief Complaint  Patient presents with   Fall    HPI Kenneth [name] is a 68 y.o. male.   Patient presents today for evaluation of left lower back and hip pain following fall.  Reports that he was coming down the stairs wearing crocs when he slipped and fell with the majority weight on his left hip and lower back.  He is confident that he did not hit his head and denies any loss of consciousness, nausea, vomiting, amnesia surrounding event, headache, dizziness.  He has not chronically anticoagulated.  Injury occurred at approximately 12:00 PM today (11/12/2023).  He reports that there was initially not significant pain but this has gradually been worsening prompting evaluation today.  He reports that currently pain is rated 4 and a 0 to pain scale, described as a soreness, worse with palpation or certain movements, no alleviating factors notified.  He does have a history of degenerative disc disease and is followed by neurosurgery where he receives intra facet injections regularly and they are considering surgical decompression of his lumbar spine.  He denies previous spinal surgery.  He denies any bowel/bladder continence, lower extremity weakness, saddle anesthesia.  He has not been taking any medication to manage his symptoms since the time of injury.  He has been urinating normally since the injury and denies any associated hematuria.    Past Medical History:  Diagnosis Date   Anxiety and depression    Bipolar 1 disorder (HCC)    Blood transfusion without reported diagnosis    Chronic kidney disease    Depression    GERD (gastroesophageal reflux disease)    Hypercholesterolemia    Hypertension    Melanoma (HCC)    right flank   Sleep apnea    no cpap - can't tolerate   Type 2 diabetes mellitus (HCC)     Patient Active Problem List   Diagnosis Date Noted   Chronic  back pain 01/03/2023   Impacted cerumen of left ear 07/18/2021   Venous reflux 06/06/2021   Lower extremity numbness 04/17/2018   Rhinorrhea 04/08/2018   Vitamin B 12 deficiency 07/28/2017   Tobacco abuse 05/05/2017   Preventative health care 04/24/2017   Numbness and tingling 04/15/2017   OSA (obstructive sleep apnea) 02/07/2017   Epigastric abdominal tenderness without rebound tenderness 09/10/2016   GERD without esophagitis 09/10/2016   Nonspecific abnormal electrocardiogram (ECG) (EKG) 09/10/2016   Lower extremity edema 08/12/2016   Type 2 diabetes mellitus (HCC) 06/26/2016   Anxiety and depression 06/26/2016   Hypertension 06/26/2016   Hyperlipidemia 06/26/2016   Chronic neck pain 06/26/2016    Past Surgical History:  Procedure Laterality Date   ANTERIOR CERVICAL DECOMP/DISCECTOMY FUSION N/A 07/24/2023   Procedure: ANTERIOR CERVICAL DECOMPRESSION FUSION CERVICAL 3- CERVICAL 4, CERVICAL 4- CERVICAL 5 WITH INSTRUMENTATION AND ALLOGRAFT;  Surgeon: Beuford Anes, MD;  Location: [facility] OR;  Service: Orthopedics;  Laterality: N/A;  ANTERIOR CERVICAL DECOMPRESSION FUSION CERVICAL 3- CERVICAL 4, CERVICAL 4- CERVICAL 5 WITH INSTRUMENTATION AND ALLOGRAFT   L hand surgery     MELANOMA EXCISION  09/21/2021   R knee arthoroscopy     SPLENECTOMY         Home Medications    Prior to Admission medications   Medication Sig Start Date End Date Taking? Authorizing Provider  lidocaine  (LIDODERM ) 5 % Place 1 patch onto the  skin daily. Remove & Discard patch within 12 hours or as directed by MD 11/12/23  Yes Karder Goodin, Rocky POUR, PA-C  Accu-Chek FastClix Lancets MISC USE TO CHECK BLOOD SUGAR UP TO 4 TIMES A DAY 03/07/22   Clark, Katherine K, NP  amLODipine  (NORVASC ) 5 MG tablet TAKE ONE TABLET DAILY FOR blood PRESSURE 08/20/22   Clark, Katherine K, NP  atorvastatin  (LIPITOR) 20 MG tablet TAKE ONE TABLET BY MOUTH EVERY evening FOR cholesterol 02/16/23   Clark, Katherine K, NP  blood glucose meter kit and  supplies KIT Dispense based on patient and insurance preference. Use up to four times daily as directed. (FOR ICD-9 250.00, 250.01). 11/18/18   Clark, Katherine K, NP  Carboxymeth-Glycerin-Polysorb (REFRESH DIGITAL) 0.5-1-0.5 % SOLN Place 1 drop into both eyes daily as needed (dry eyes).    [provider]  cyanocobalamin  (VITAMIN B12) 1000 MCG tablet Take 1,000 mcg by mouth daily.    [provider]  fluticasone  (FLONASE ) 50 MCG/ACT nasal spray ONE SPRAY IN EACH NOSTRIL TWICE DAILY AS NEEDED FOR ALLERGIES OR RHINITIS 03/30/23   Clark, Katherine K, NP  glucose blood (ACCU-CHEK GUIDE) test strip USE TO CHECK UP TO 4 TIMES A DAY 03/07/22   Clark, Katherine K, NP  HYDROcodone -acetaminophen  (NORCO/VICODIN) 5-325 MG tablet Take 1 tablet by mouth every 6 (six) hours as needed for moderate pain (pain score 4-6) or severe pain (pain score 7-10). 07/24/23 07/23/24  McKenzie, Kayla J, PA-C  methocarbamol  (ROBAXIN ) 500 MG tablet Take 1-2 tablets (500-1,000 mg total) by mouth every 6 (six) hours as needed for muscle spasms. 07/24/23   McKenzie, Kayla J, PA-C  Multiple Vitamins-Minerals (MULTIVITAMIN ADULT) TABS Take 1 tablet by mouth daily.    [provider]  oxymetazoline (DRISTAN) 0.05 % nasal spray Place 1 spray into both nostrils every 3 (three) days.    [provider]  sertraline  (ZOLOFT ) 50 MG tablet TAKE ONE TABLET EACH DAY FOR ANXIETY AND DEPRESSION 05/01/23   Clark, Katherine K, NP  tiZANidine (ZANAFLEX) 4 MG tablet Take 4 mg by mouth every 6 (six) hours as needed for muscle spasms. 06/17/23   [provider]  valsartan -hydrochlorothiazide  (DIOVAN -HCT) 320-25 MG tablet TAKE ONE TABLET DAILY FOR BLOOD PRESSURE 01/15/23   Gretta Comer POUR, NP    Family History Family History  Problem Relation Age of Onset   Hypertension Mother    Mental illness Mother    Diabetes Mother    Hypertension Father    Lung cancer Father    Diabetes Paternal Grandmother    Colon  cancer Maternal Uncle    Esophageal cancer Neg Hx    Liver cancer Neg Hx    Pancreatic cancer Neg Hx    Rectal cancer Neg Hx    Stomach cancer Neg Hx     Social History Social History   Tobacco Use   Smoking status: Every Day    Current packs/day: 1.00    Average packs/day: 1 pack/day for 89.6 years (89.6 ttl pk-yrs)    Types: Cigarettes    Start date: 1977   Smokeless tobacco: Never  Vaping Use   Vaping status: Never Used  Substance Use Topics   Alcohol use: Yes    Comment: occassionaly 1/month   Drug use: No     Allergies   Patient has no known allergies.   Review of Systems Review of Systems  Constitutional:  Positive for activity change. Negative for appetite change, fatigue and fever.  Gastrointestinal:  Negative for nausea and  vomiting.  Genitourinary:  Negative for hematuria.  Musculoskeletal:  Positive for arthralgias and back pain. Negative for myalgias.  Skin:  Negative for color change and wound.  Neurological:  Negative for weakness and numbness.     Physical Exam Triage Vital Signs ED Triage Vitals [11/12/23 1927]  Encounter Vitals Group     BP (!) 154/91     Girls Systolic BP Percentile      Girls Diastolic BP Percentile      Boys Systolic BP Percentile      Boys Diastolic BP Percentile      Pulse Rate 82     Resp 18     Temp 98.2 F (36.8 C)     Temp Source Oral     SpO2 95 %     Weight      Height      Head Circumference      Peak Flow      Pain Score 3     Pain Loc      Pain Education      Exclude from Growth Chart    No data found.  Updated Vital Signs BP (!) 154/91 (BP Location: Left Arm)   Pulse 82   Temp 98.2 F (36.8 C) (Oral)   Resp 18   SpO2 95%   Visual Acuity Right Eye Distance:   Left Eye Distance:   Bilateral Distance:    Right Eye Near:   Left Eye Near:    Bilateral Near:     Physical Exam Vitals reviewed.  Constitutional:      General: He is awake.     Appearance: Normal appearance. He is  well-developed. He is not ill-appearing.     Comments: Very pleasant male appear stated age in no acute distress sitting comfortably in exam room  HENT:     Head: Normocephalic and atraumatic.  Cardiovascular:     Rate and Rhythm: Normal rate and regular rhythm.     Heart sounds: Normal heart sounds, S1 normal and S2 normal. No murmur heard. Pulmonary:     Effort: Pulmonary effort is normal.     Breath sounds: Normal breath sounds. No stridor. No wheezing, rhonchi or rales.     Comments: Clear to auscultation bilaterally Abdominal:     General: Bowel sounds are normal.     Palpations: Abdomen is soft.     Tenderness: There is no abdominal tenderness. There is no right CVA tenderness, left CVA tenderness, guarding or rebound.     Comments: Benign abdominal exam  Musculoskeletal:     Cervical back: No tenderness or bony tenderness.     Thoracic back: No tenderness or bony tenderness.     Lumbar back: Tenderness and bony tenderness present. Negative right straight leg raise test and negative left straight leg raise test.       Back:     Comments: Back: Mild tenderness palpation of left lumbar paraspinal muscles with associated swelling.  No bruising or deformity noted.  Mild pain with percussion of lumbar vertebrae without deformity or step-off noted.  Tenderness palpation over iliac crest as well.  Normal gait.  Strength 5/5 bilateral lower extremities.  Neurological:     Mental Status: He is alert.  Psychiatric:        Behavior: Behavior is cooperative.      UC Treatments / Results  Labs (all labs ordered are listed, but only abnormal results are displayed) Labs Reviewed  POCT URINALYSIS DIP (MANUAL ENTRY) - Abnormal; Notable  for the following components:      Result Value   Color, UA straw (*)    Ketones, POC UA trace (5) (*)    Protein Ur, POC trace (*)    All other components within normal limits    EKG   Radiology DG Hip Unilat With Pelvis 2-3 Views Left Result  Date: 11/12/2023 CLINICAL DATA:  [id number] Fall 190176 EXAM: DG HIP (WITH OR WITHOUT PELVIS) 2-3V LEFT COMPARISON:  None Available. FINDINGS: No evidence of pelvic fracture or diastasis.No acute hip fracture or dislocation. Soft tissues are unremarkable. IMPRESSION: No acute fracture, pelvic bone diastasis, or dislocation. Electronically Signed   By: Rogelia Myers M.D.   On: 11/12/2023 20:39   DG Lumbar Spine Complete Result Date: 11/12/2023 CLINICAL DATA:  fall EXAM: LUMBAR SPINE - COMPLETE 4+ VIEW COMPARISON:  None Available. FINDINGS: Five non rib-bearing lumbar type vertebral bodies. Straightening of the normal lumbar lordosis. No spondylolisthesis. Vertebral body heights are well maintained without acute fracture. No pars interarticularis defects.Moderate intervertebral disc height loss throughout the lumbar spine with large osteophyte formation. Facet arthropathy in the lower lumbar spine. The soft tissues are otherwise unremarkable. IMPRESSION: 1. No acute fracture or malalignment of the lumbar spine. 2. Moderate multilevel degenerative disc disease throughout the lumbar spine. Electronically Signed   By: Rogelia Myers M.D.   On: 11/12/2023 20:36    Procedures Procedures (including critical care time)  Medications Ordered in UC Medications - No data to display  Initial Impression / Assessment and Plan / UC Course  I have reviewed the triage vital signs and the nursing notes.  Pertinent labs & imaging results that were available during my care of the patient were reviewed by me and considered in my medical decision making (see chart for details).     Patient is well-appearing, afebrile, nontoxic, nontachycardic.  Patient denies any head trauma or alarm symptoms that would warrant emergent evaluation and advanced imaging.  X-ray of pelvis/hip and lumbar spine were obtained that showed degenerative changes without acute osseous abnormality.  Patient was concerned about contusion of kidney and  so UA was obtained that showed no significant blood.  Discussed that he would likely have ongoing soreness for a few days and so was encouraged use conservative treatment measures including heat and gentle stretch.  He can use lidocaine  patches for additional symptom relief.  Recommend close follow-up with his orthopedic provider to determine if additional workup/intervention is required.  We discussed that if he has any worsening or changing symptoms including significant bruising, hematuria, blood in his stool or difficulty passing stool, fever, weakness in his legs he needs to be seen emergently.  Recommend close follow-up with his primary care.  Final Clinical Impressions(s) / UC Diagnoses   Final diagnoses:  Fall, initial encounter  Lumbar contusion, initial encounter  Left hip pain  Contusion of left hip, initial encounter     Discharge Instructions      Your x-rays showed arthritis but did not show any evidence of a broken or out of place bone.  This is great news.  Your urine did not have any blood in it.  Apply lidocaine  patch to help with your pain.  You can use over-the-counter medicine such as Tylenol  for additional pain relief.  I also recommend heat and gentle stretch.  Follow-up with your orthopedic provider as scheduled.  If anything worsens and you have blood in your urine, difficulty going to the bathroom, blood in your stool, severe pain, extensive bruising,  weakness in your legs, going to the bathroom understand without noticing it you need to be seen immediately.     ED Prescriptions     Medication Sig Dispense Auth. Provider   lidocaine  (LIDODERM ) 5 % Place 1 patch onto the skin daily. Remove & Discard patch within 12 hours or as directed by MD 10 patch Anissia Wessells K, PA-C      I have reviewed the PDMP during this encounter.   Sherrell Rocky POUR, PA-C 11/12/23 2051

## 2023-11-14 DIAGNOSIS — M48062 Spinal stenosis, lumbar region with neurogenic claudication: Secondary | ICD-10-CM | POA: Diagnosis not present

## 2023-11-17 ENCOUNTER — Other Ambulatory Visit: Payer: Self-pay | Admitting: Primary Care

## 2023-11-17 DIAGNOSIS — I10 Essential (primary) hypertension: Secondary | ICD-10-CM

## 2023-11-18 DIAGNOSIS — M48062 Spinal stenosis, lumbar region with neurogenic claudication: Secondary | ICD-10-CM | POA: Diagnosis not present

## 2023-11-18 NOTE — Telephone Encounter (Signed)
Lvmtcb, no mychart set up to send message  

## 2023-11-18 NOTE — Telephone Encounter (Signed)
Patient is due for CPE/follow up in mid October, this will be required prior to any further refills.  Please schedule, thank you!

## 2023-12-18 ENCOUNTER — Ambulatory Visit (HOSPITAL_COMMUNITY)
Admission: EM | Admit: 2023-12-18 | Discharge: 2023-12-18 | Disposition: A | Discharge status: Home / Self Care | Attending: Emergency Medicine | Admitting: Emergency Medicine

## 2023-12-18 ENCOUNTER — Encounter (HOSPITAL_COMMUNITY): Payer: Self-pay | Admitting: Emergency Medicine

## 2023-12-18 DIAGNOSIS — L03114 Cellulitis of left upper limb: Secondary | ICD-10-CM | POA: Diagnosis not present

## 2023-12-18 DIAGNOSIS — W57XXXA Bitten or stung by nonvenomous insect and other nonvenomous arthropods, initial encounter: Secondary | ICD-10-CM | POA: Diagnosis not present

## 2023-12-18 DIAGNOSIS — S50862A Insect bite (nonvenomous) of left forearm, initial encounter: Secondary | ICD-10-CM | POA: Diagnosis not present

## 2023-12-18 MED ORDER — DOXYCYCLINE HYCLATE 100 MG PO CAPS: 100.0000 mg | ORAL_CAPSULE | Freq: Two times a day (BID) | ORAL | 0 refills | Status: DC

## 2023-12-18 NOTE — ED Triage Notes (Addendum)
 Reports Sunday got bit on left forearm by something. Reports itching. Put some polysporin on area. Reports the area has swollen up some. Yesterday noticed area was bleeding and felt a knot. Reports put heat on area yesterday and got some pus out. Last night started hurting in the area.

## 2023-12-18 NOTE — ED Provider Notes (Signed)
 [facility]    CSN: [id number] Arrival date & time: 12/18/23  0948      History   Chief Complaint Chief Complaint  Patient presents with   Insect Bite    HPI Kenneth [name] is a 68 y.o. male.   Patient presents with redness and swelling to his left forearm after being bit by an insect on 9/14.  Patient is unsure exactly which insect bit him, but states that he felt a bug bite and then had some itching to his left forearm.  Patient states that he has been itching his left forearm over the last few days and then noticed yesterday that there was some swelling, redness, and bloody purulent drainage from the area.  Denies fever, body aches, chills, and weakness.  The history is provided by the patient and medical records.    Past Medical History:  Diagnosis Date   Anxiety and depression    Bipolar 1 disorder (HCC)    Blood transfusion without reported diagnosis    Chronic kidney disease    Depression    GERD (gastroesophageal reflux disease)    Hypercholesterolemia    Hypertension    Melanoma (HCC)    right flank   Sleep apnea    no cpap - can't tolerate   Type 2 diabetes mellitus (HCC)     Patient Active Problem List   Diagnosis Date Noted   Chronic back pain 01/03/2023   Impacted cerumen of left ear 07/18/2021   Venous reflux 06/06/2021   Lower extremity numbness 04/17/2018   Rhinorrhea 04/08/2018   Vitamin B 12 deficiency 07/28/2017   Tobacco abuse 05/05/2017   Preventative health care 04/24/2017   Numbness and tingling 04/15/2017   OSA (obstructive sleep apnea) 02/07/2017   Epigastric abdominal tenderness without rebound tenderness 09/10/2016   GERD without esophagitis 09/10/2016   Nonspecific abnormal electrocardiogram (ECG) (EKG) 09/10/2016   Lower extremity edema 08/12/2016   Type 2 diabetes mellitus (HCC) 06/26/2016   Anxiety and depression 06/26/2016   Hypertension 06/26/2016   Hyperlipidemia 06/26/2016   Chronic neck pain 06/26/2016     Past Surgical History:  Procedure Laterality Date   ANTERIOR CERVICAL DECOMP/DISCECTOMY FUSION N/A 07/24/2023   Procedure: ANTERIOR CERVICAL DECOMPRESSION FUSION CERVICAL 3- CERVICAL 4, CERVICAL 4- CERVICAL 5 WITH INSTRUMENTATION AND ALLOGRAFT;  Surgeon: Beuford Anes, MD;  Location: [facility];  Service: Orthopedics;  Laterality: N/A;  ANTERIOR CERVICAL DECOMPRESSION FUSION CERVICAL 3- CERVICAL 4, CERVICAL 4- CERVICAL 5 WITH INSTRUMENTATION AND ALLOGRAFT   L hand surgery     MELANOMA EXCISION  09/21/2021   R knee arthoroscopy     SPLENECTOMY         Home Medications    Prior to Admission medications   Medication Sig Start Date End Date Taking? Authorizing Provider  doxycycline  (VIBRAMYCIN ) 100 MG capsule Take 1 capsule (100 mg total) by mouth 2 (two) times daily. 12/18/23  Yes [name], NP  Accu-Chek FastClix Lancets MISC USE TO CHECK BLOOD SUGAR UP TO 4 TIMES A DAY 03/07/22   Clark, Katherine K, NP  amLODipine  (NORVASC ) 5 MG tablet TAKE ONE TABLET DAILY FOR blood PRESSURE 11/18/23   Clark, Katherine K, NP  atorvastatin  (LIPITOR) 20 MG tablet TAKE ONE TABLET BY MOUTH EVERY evening FOR cholesterol 02/16/23   Clark, Katherine K, NP  blood glucose meter kit and supplies KIT Dispense based on patient and insurance preference. Use up to four times daily as directed. (FOR ICD-9 250.00, 250.01). 11/18/18   Gretta,  Katherine K, NP  Carboxymeth-Glycerin-Polysorb (REFRESH DIGITAL) 0.5-1-0.5 % SOLN Place 1 drop into both eyes daily as needed (dry eyes).    [provider]  cyanocobalamin  (VITAMIN B12) 1000 MCG tablet Take 1,000 mcg by mouth daily.    [provider]  fluticasone  (FLONASE ) 50 MCG/ACT nasal spray ONE SPRAY IN EACH NOSTRIL TWICE DAILY AS NEEDED FOR ALLERGIES OR RHINITIS 03/30/23   Clark, Katherine K, NP  glucose blood (ACCU-CHEK GUIDE) test strip USE TO CHECK UP TO 4 TIMES A DAY 03/07/22   Clark, Katherine K, NP  HYDROcodone -acetaminophen  (NORCO/VICODIN) 5-325 MG  tablet Take 1 tablet by mouth every 6 (six) hours as needed for moderate pain (pain score 4-6) or severe pain (pain score 7-10). 07/24/23 07/23/24  McKenzie, Kayla J, PA-C  lidocaine  (LIDODERM ) 5 % Place 1 patch onto the skin daily. Remove & Discard patch within 12 hours or as directed by MD 11/12/23   Raspet, Rocky POUR, PA-C  methocarbamol  (ROBAXIN ) 500 MG tablet Take 1-2 tablets (500-1,000 mg total) by mouth every 6 (six) hours as needed for muscle spasms. 07/24/23   McKenzie, Kayla J, PA-C  Multiple Vitamins-Minerals (MULTIVITAMIN ADULT) TABS Take 1 tablet by mouth daily.    [provider]  oxymetazoline (DRISTAN) 0.05 % nasal spray Place 1 spray into both nostrils every 3 (three) days.    [provider]  sertraline  (ZOLOFT ) 50 MG tablet TAKE ONE TABLET EACH DAY FOR ANXIETY AND DEPRESSION 05/01/23   Clark, Katherine K, NP  tiZANidine (ZANAFLEX) 4 MG tablet Take 4 mg by mouth every 6 (six) hours as needed for muscle spasms. 06/17/23   [provider]  valsartan -hydrochlorothiazide  (DIOVAN -HCT) 320-25 MG tablet TAKE ONE TABLET DAILY FOR BLOOD PRESSURE 01/15/23   Gretta Comer POUR, NP    Family History Family History  Problem Relation Age of Onset   Hypertension Mother    Mental illness Mother    Diabetes Mother    Hypertension Father    Lung cancer Father    Diabetes Paternal Grandmother    Colon cancer Maternal Uncle    Esophageal cancer Neg Hx    Liver cancer Neg Hx    Pancreatic cancer Neg Hx    Rectal cancer Neg Hx    Stomach cancer Neg Hx     Social History Social History   Tobacco Use   Smoking status: Every Day    Current packs/day: 1.00    Average packs/day: 1 pack/day for 89.7 years (89.7 ttl pk-yrs)    Types: Cigarettes    Start date: 1977   Smokeless tobacco: Never  Vaping Use   Vaping status: Never Used  Substance Use Topics   Alcohol use: Yes    Comment: occassionaly 1/month   Drug use: No     Allergies   Patient has no known  allergies.   Review of Systems Review of Systems  Per HPI  Physical Exam Triage Vital Signs ED Triage Vitals  Encounter Vitals Group     BP 12/18/23 1021 138/74     Girls Systolic BP Percentile --      Girls Diastolic BP Percentile --      Boys Systolic BP Percentile --      Boys Diastolic BP Percentile --      Pulse Rate 12/18/23 1021 65     Resp 12/18/23 1021 17     Temp 12/18/23 1021 97.9 F (36.6 C)     Temp Source 12/18/23 1021 Oral     SpO2 12/18/23  1021 96 %     Weight --      Height --      Head Circumference --      Peak Flow --      Pain Score 12/18/23 1019 3     Pain Loc --      Pain Education --      Exclude from Growth Chart --    No data found.  Updated Vital Signs BP 138/74 (BP Location: Right Arm)   Pulse 65   Temp 97.9 F (36.6 C) (Oral)   Resp 17   SpO2 96%   Visual Acuity Right Eye Distance:   Left Eye Distance:   Bilateral Distance:    Right Eye Near:   Left Eye Near:    Bilateral Near:     Physical Exam Vitals and nursing note reviewed.  Constitutional:      General: He is awake. He is not in acute distress.    Appearance: Normal appearance. He is well-developed and well-groomed. He is not ill-appearing.  Skin:    General: Skin is warm and dry.     Findings: Erythema and wound present.     Comments: Small open wound noted to posterior left forearm with bloody drainage.  Surrounding erythema and induration measuring approximately 3 cm in diameter.  Neurological:     Mental Status: He is alert.  Psychiatric:        Behavior: Behavior is cooperative.      UC Treatments / Results  Labs (all labs ordered are listed, but only abnormal results are displayed) Labs Reviewed - No data to display  EKG   Radiology No results found.  Procedures Procedures (including critical care time)  Medications Ordered in UC Medications - No data to display  Initial Impression / Assessment and Plan / UC Course  I have reviewed the  triage vital signs and the nursing notes.  Pertinent labs & imaging results that were available during my care of the patient were reviewed by me and considered in my medical decision making (see chart for details).     Patient is overall well-appearing.  Vitals are stable.  Exam findings consistent with cellulitis from insect bite.  Prescribed doxycycline  for this.  Discussed follow-up, return, and strict ER precautions. Final Clinical Impressions(s) / UC Diagnoses   Final diagnoses:  Insect bite of left forearm, initial encounter  Cellulitis of left upper extremity     Discharge Instructions      Start taking doxycycline  twice daily for 10 days for cellulitis coverage. You can continue applying warm compresses to this area to help promote drainage and reduce swelling. If you notice significantly increased swelling, spreading of redness, or develop a fever in the next 24 hours please seek immediate medical treatment in the emergency department. Otherwise follow-up with your primary care provider or return here as needed.     ED Prescriptions     Medication Sig Dispense Auth. Provider   doxycycline  (VIBRAMYCIN ) 100 MG capsule Take 1 capsule (100 mg total) by mouth 2 (two) times daily. 20 capsule [name], NP      PDMP not reviewed this encounter.   [name], NP 12/18/23 1056

## 2023-12-18 NOTE — Discharge Instructions (Addendum)
 Start taking doxycycline  twice daily for 10 days for cellulitis coverage. You can continue applying warm compresses to this area to help promote drainage and reduce swelling. If you notice significantly increased swelling, spreading of redness, or develop a fever in the next 24 hours please seek immediate medical treatment in the emergency department. Otherwise follow-up with your primary care provider or return here as needed.

## 2023-12-29 ENCOUNTER — Ambulatory Visit (HOSPITAL_COMMUNITY): Admission: EM | Admit: 2023-12-29 | Discharge: 2023-12-29 | Disposition: A | Discharge status: Home / Self Care

## 2023-12-29 ENCOUNTER — Encounter (HOSPITAL_COMMUNITY): Payer: Self-pay

## 2023-12-29 ENCOUNTER — Ambulatory Visit (INDEPENDENT_AMBULATORY_CARE_PROVIDER_SITE_OTHER)

## 2023-12-29 DIAGNOSIS — M48062 Spinal stenosis, lumbar region with neurogenic claudication: Secondary | ICD-10-CM | POA: Diagnosis not present

## 2023-12-29 DIAGNOSIS — S52531A Colles' fracture of right radius, initial encounter for closed fracture: Secondary | ICD-10-CM

## 2023-12-29 MED ORDER — HYDROCODONE-ACETAMINOPHEN 5-325 MG PO TABS: 2.0000 | ORAL_TABLET | ORAL | 0 refills | Status: DC | PRN
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
## 2024-01-05 ENCOUNTER — Encounter (HOSPITAL_COMMUNITY): Payer: Self-pay | Admitting: Emergency Medicine

## 2024-01-05 ENCOUNTER — Ambulatory Visit (HOSPITAL_COMMUNITY)
Admission: EM | Admit: 2024-01-05 | Discharge: 2024-01-05 | Disposition: A | Discharge status: Home / Self Care | Attending: Internal Medicine | Admitting: Internal Medicine

## 2024-01-05 DIAGNOSIS — S52531D Colles' fracture of right radius, subsequent encounter for closed fracture with routine healing: Secondary | ICD-10-CM

## 2024-01-05 NOTE — ED Notes (Signed)
Pt requesting hydrocodone. 

## 2024-01-05 NOTE — ED Triage Notes (Signed)
 Pt st's he has appointment with ortho on Thursday.   Pt st's he has called the ortho today but said they would not give him any hydrocodone 
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
## 2024-01-05 NOTE — Discharge Instructions (Addendum)
 You have fractured your wrist. We placed your right hand/wrist in a splint, avoid getting the splint wet. Wear splint at all times and do not remove it until your orthopedic follow-up appointment.  Rest, ice, elevate, and compress the injury to reduce swelling and inflammation.   Take tylenol  1,000mg  every 6 hours as needed for pain.   Follow-up with the hand specialist as scheduled on 01/08/2024 (Dr. Sebastian).   Return if you experience worsening pain, numbness, tingling, skin color changes, or any other concerning symptoms. If symptoms are severe, please go to the ER. I hope you feel better!!

## 2024-01-07 ENCOUNTER — Encounter: Payer: Self-pay | Admitting: Primary Care

## 2024-01-07 ENCOUNTER — Ambulatory Visit (INDEPENDENT_AMBULATORY_CARE_PROVIDER_SITE_OTHER): Admitting: Primary Care

## 2024-01-07 VITALS — BP 132/80 | HR 80 | Temp 97.8°F | Ht 68.0 in | Wt 199.0 lb

## 2024-01-07 DIAGNOSIS — F419 Anxiety disorder, unspecified: Secondary | ICD-10-CM | POA: Diagnosis not present

## 2024-01-07 DIAGNOSIS — G4733 Obstructive sleep apnea (adult) (pediatric): Secondary | ICD-10-CM

## 2024-01-07 DIAGNOSIS — E1165 Type 2 diabetes mellitus with hyperglycemia: Secondary | ICD-10-CM

## 2024-01-07 DIAGNOSIS — Z720 Tobacco use: Secondary | ICD-10-CM | POA: Diagnosis not present

## 2024-01-07 DIAGNOSIS — M542 Cervicalgia: Secondary | ICD-10-CM | POA: Diagnosis not present

## 2024-01-07 DIAGNOSIS — Z125 Encounter for screening for malignant neoplasm of prostate: Secondary | ICD-10-CM | POA: Diagnosis not present

## 2024-01-07 DIAGNOSIS — I10 Essential (primary) hypertension: Secondary | ICD-10-CM

## 2024-01-07 DIAGNOSIS — F32A Depression, unspecified: Secondary | ICD-10-CM

## 2024-01-07 DIAGNOSIS — G8929 Other chronic pain: Secondary | ICD-10-CM | POA: Diagnosis not present

## 2024-01-07 DIAGNOSIS — E785 Hyperlipidemia, unspecified: Secondary | ICD-10-CM

## 2024-01-07 DIAGNOSIS — Z0000 Encounter for general adult medical examination without abnormal findings: Secondary | ICD-10-CM | POA: Diagnosis not present

## 2024-01-07 DIAGNOSIS — K219 Gastro-esophageal reflux disease without esophagitis: Secondary | ICD-10-CM | POA: Diagnosis not present

## 2024-01-07 DIAGNOSIS — E538 Deficiency of other specified B group vitamins: Secondary | ICD-10-CM | POA: Diagnosis not present

## 2024-01-07 MED ORDER — SERTRALINE HCL 50 MG PO TABS: 75.0000 mg | ORAL_TABLET | Freq: Every day | ORAL | Status: DC

## 2024-01-07 NOTE — Assessment & Plan Note (Addendum)
 Slightly deteriorated.   Agree to increase Zoloft , will start with 75 mg.

## 2024-01-07 NOTE — Assessment & Plan Note (Signed)
 Controlled.  Continue OTC treatment PRN

## 2024-01-07 NOTE — Assessment & Plan Note (Signed)
 Stable.  Continue amlodipine  5 mg daily, valsartan -hydrochlorothiazide  320-25 mg daily. CMP pending

## 2024-01-07 NOTE — Assessment & Plan Note (Signed)
 Not using CPAP, discussed recommendations to do so.

## 2024-01-07 NOTE — Assessment & Plan Note (Signed)
 Repeat A1C pending.   Continue off treatment.  He will see his eye doctor soon.   Follow up in 3-6 months depending on A1C result.

## 2024-01-07 NOTE — Assessment & Plan Note (Addendum)
 Improved.   Following with orthopedics. S/P surgery from April 2025  Continue Tizanidine.

## 2024-01-07 NOTE — Assessment & Plan Note (Signed)
 Continue lung cancer screening program. Proceed with CT scan in November.

## 2024-01-07 NOTE — Assessment & Plan Note (Signed)
Immunizations UTD. Influenza vaccine provided today.  Colonoscopy UTD, due 2029 PSA due and pending.  Discussed the importance of a healthy diet and regular exercise in order for weight loss, and to reduce the risk of further co-morbidity.  Exam stable. Labs pending.  Follow up in 1 year for repeat physical.

## 2024-01-07 NOTE — Progress Notes (Signed)
 Subjective:    Patient ID: Kenneth [name], male    DOB: 1955-06-09, 68 y.o.   MRN: [id number]  Kenneth [name] is a very pleasant 68 y.o. male who presents today for complete physical and follow up of chronic conditions.  He would like to discuss increasing his Zoloft  for symptoms of little motivation to do things and anxiety. He was managed on Zoloft  100 mg at one point but it caused him to feel drowsy.   Immunizations: -Tetanus: Completed in 2015 -Influenza: Influenza vaccine provided today.  -Shingles: Completed Shingrix series -Pneumonia: Completed Prevnar 20 in 2024  Diet: Fair diet.  Exercise: No regular exercise.  Eye exam: Completes annually  Dental exam: Completes semi-annually    Colonoscopy: Completed in 2019, due 2029 Lung Cancer Screening: Completed in November 2024, scheduled for November 2025  PSA: Due   BP Readings from Last 3 Encounters:  01/07/24 132/80  01/05/24 (!) 160/81  12/29/23 (!) 162/105        Review of Systems  Constitutional:  Negative for unexpected weight change.  HENT:  Negative for rhinorrhea.   Respiratory:  Negative for cough and shortness of breath.   Cardiovascular:  Negative for chest pain.  Gastrointestinal:  Negative for constipation and diarrhea.  Genitourinary:  Negative for difficulty urinating.  Musculoskeletal:  Positive for arthralgias.  Skin:  Negative for rash.  Allergic/Immunologic: Negative for environmental allergies.  Neurological:  Negative for dizziness and headaches.  Psychiatric/Behavioral:  The patient is not nervous/anxious.          Past Medical History:  Diagnosis Date   Anxiety and depression    Bipolar 1 disorder (HCC)    Blood transfusion without reported diagnosis    Chronic kidney disease    Depression    GERD (gastroesophageal reflux disease)    Hypercholesterolemia    Hypertension    Melanoma (HCC)    right flank   Sleep apnea    no cpap - can't tolerate   Type 2 diabetes mellitus  (HCC)     Social History   Socioeconomic History   Marital status: Divorced    Spouse name: Not on file   Number of children: Not on file   Years of education: Not on file   Highest education level: Not on file  Occupational History   Not on file  Tobacco Use   Smoking status: Every Day    Current packs/day: 1.00    Average packs/day: 1 pack/day for 89.8 years (89.8 ttl pk-yrs)    Types: Cigarettes    Start date: 37   Smokeless tobacco: Never  Vaping Use   Vaping status: Never Used  Substance and Sexual Activity   Alcohol use: Yes    Comment: occassionaly 1/month   Drug use: No   Sexual activity: Not Currently  Other Topics Concern   Not on file  Social History Narrative   Single.   1 daughter, 1 grandchild.   Retired. Once worked in [employer].   Enjoys sports, racing, traveling.    Social Drivers of Corporate investment banker Strain: Low Risk  (05/21/2022)   Overall Financial Resource Strain (CARDIA)    Difficulty of Paying Living Expenses: Not hard at all  Food Insecurity: No Food Insecurity (06/24/2023)   Hunger Vital Sign    Worried About Running Out of Food in the Last Year: Never true    Ran Out of Food in the Last Year: Never true  Transportation Needs: No Transportation Needs (06/24/2023)  PRAPARE - Administrator, Civil Service (Medical): No    Lack of Transportation (Non-Medical): No  Physical Activity: Inactive (06/24/2023)   Exercise Vital Sign    Days of Exercise per Week: 0 days    Minutes of Exercise per Session: 0 min  Stress: No Stress Concern Present (06/24/2023)   [employer] of Occupational Health - Occupational Stress Questionnaire    Feeling of Stress : Not at all  Social Connections: Socially Isolated (06/24/2023)   Social Connection and Isolation Panel    Frequency of Communication with Friends and Family: More than three times a week    Frequency of Social Gatherings with Friends and Family: Three times a week    Attends  Religious Services: Never    Active Member of Clubs or Organizations: No    Attends Banker Meetings: Never    Marital Status: Divorced  Catering manager Violence: Not At Risk (06/24/2023)   Humiliation, Afraid, Rape, and Kick questionnaire    Fear of Current or Ex-Partner: No    Emotionally Abused: No    Physically Abused: No    Sexually Abused: No    Past Surgical History:  Procedure Laterality Date   ANTERIOR CERVICAL DECOMP/DISCECTOMY FUSION N/A 07/24/2023   Procedure: ANTERIOR CERVICAL DECOMPRESSION FUSION CERVICAL 3- CERVICAL 4, CERVICAL 4- CERVICAL 5 WITH INSTRUMENTATION AND ALLOGRAFT;  Surgeon: Beuford Anes, MD;  Location: [facility] OR;  Service: Orthopedics;  Laterality: N/A;  ANTERIOR CERVICAL DECOMPRESSION FUSION CERVICAL 3- CERVICAL 4, CERVICAL 4- CERVICAL 5 WITH INSTRUMENTATION AND ALLOGRAFT   L hand surgery     MELANOMA EXCISION  09/21/2021   R knee arthoroscopy     SPLENECTOMY      Family History  Problem Relation Age of Onset   Hypertension Mother    Mental illness Mother    Diabetes Mother    Hypertension Father    Lung cancer Father    Diabetes Paternal Grandmother    Colon cancer Maternal Uncle    Esophageal cancer Neg Hx    Liver cancer Neg Hx    Pancreatic cancer Neg Hx    Rectal cancer Neg Hx    Stomach cancer Neg Hx     No Known Allergies  Current Outpatient Medications on File Prior to Visit  Medication Sig Dispense Refill   Accu-Chek FastClix Lancets MISC USE TO CHECK BLOOD SUGAR UP TO 4 TIMES A DAY 200 each 3   amLODipine  (NORVASC ) 5 MG tablet TAKE ONE TABLET DAILY FOR blood PRESSURE 90 tablet 0   atorvastatin  (LIPITOR) 20 MG tablet TAKE ONE TABLET BY MOUTH EVERY evening FOR cholesterol 90 tablet 2   blood glucose meter kit and supplies KIT Dispense based on patient and insurance preference. Use up to four times daily as directed. (FOR ICD-9 250.00, 250.01). 1 each 0   Carboxymeth-Glycerin-Polysorb (REFRESH DIGITAL) 0.5-1-0.5 % SOLN Place  1 drop into both eyes daily as needed (dry eyes).     cyanocobalamin  (VITAMIN B12) 1000 MCG tablet Take 1,000 mcg by mouth daily.     fluticasone  (FLONASE ) 50 MCG/ACT nasal spray ONE SPRAY IN EACH NOSTRIL TWICE DAILY AS NEEDED FOR ALLERGIES OR RHINITIS 48 g 0   glucose blood (ACCU-CHEK GUIDE) test strip USE TO CHECK UP TO 4 TIMES A DAY 200 strip 3   lidocaine  (LIDODERM ) 5 % Place 1 patch onto the skin daily. Remove & Discard patch within 12 hours or as directed by MD 10 patch 0   Multiple Vitamins-Minerals (MULTIVITAMIN ADULT)  TABS Take 1 tablet by mouth daily.     oxymetazoline (DRISTAN) 0.05 % nasal spray Place 1 spray into both nostrils every 3 (three) days.     sertraline  (ZOLOFT ) 50 MG tablet TAKE ONE TABLET EACH DAY FOR ANXIETY AND DEPRESSION 90 tablet 1   tiZANidine (ZANAFLEX) 4 MG tablet Take 4 mg by mouth every 6 (six) hours as needed for muscle spasms.     valsartan -hydrochlorothiazide  (DIOVAN -HCT) 320-25 MG tablet TAKE ONE TABLET DAILY FOR BLOOD PRESSURE 90 tablet 3   No current facility-administered medications on file prior to visit.    BP 132/80   Pulse 80   Temp 97.8 F (36.6 C) (Temporal)   Ht 5' 8 (1.727 m)   Wt 199 lb (90.3 kg)   SpO2 96%   BMI 30.26 kg/m  Objective:   Physical Exam HENT:     Right Ear: Tympanic membrane and ear canal normal.     Left Ear: Tympanic membrane and ear canal normal.  Eyes:     Pupils: Pupils are equal, round, and reactive to light.  Cardiovascular:     Rate and Rhythm: Normal rate and regular rhythm.  Pulmonary:     Effort: Pulmonary effort is normal.     Breath sounds: Normal breath sounds.  Abdominal:     General: Bowel sounds are normal.     Palpations: Abdomen is soft.     Tenderness: There is no abdominal tenderness.  Musculoskeletal:        General: Normal range of motion.     Cervical back: Neck supple.  Skin:    General: Skin is warm and dry.  Neurological:     Mental Status: He is alert and oriented to person,  place, and time.     Cranial Nerves: No cranial nerve deficit.     Deep Tendon Reflexes:     Reflex Scores:      Patellar reflexes are 2+ on the right side and 2+ on the left side. Psychiatric:        Mood and Affect: Mood normal.     Physical Exam        Assessment & Plan:  Preventative health care Assessment & Plan: Immunizations UTD. Influenza vaccine provided today.  Colonoscopy UTD, due 2029 PSA due and pending.  Discussed the importance of a healthy diet and regular exercise in order for weight loss, and to reduce the risk of further co-morbidity.  Exam stable. Labs pending.  Follow up in 1 year for repeat physical.    Primary hypertension Assessment & Plan: Stable.  Continue amlodipine  5 mg daily, valsartan -hydrochlorothiazide  320-25 mg daily. CMP pending  Orders: -     Comprehensive metabolic panel with GFR  OSA (obstructive sleep apnea) Assessment & Plan: Not using CPAP, discussed recommendations to do so.   GERD without esophagitis Assessment & Plan: Controlled.  Continue OTC treatment PRN.   Type 2 diabetes mellitus with hyperglycemia, without long-term current use of insulin  (HCC) Assessment & Plan: Repeat A1C pending.   Continue off treatment.  He will see his eye doctor soon.   Follow up in 3-6 months depending on A1C result.   Orders: -     Hemoglobin A1c  Tobacco abuse Assessment & Plan: Continue lung cancer screening program. Proceed with CT scan in November.    Hyperlipidemia, unspecified hyperlipidemia type Assessment & Plan: Repeat lipid panel pending.  Continue atorvastatin  20 mg daily.  Orders: -     Lipid panel  Chronic neck pain Assessment &  Plan: Improved.   Following with orthopedics. S/P surgery from April 2025  Continue Tizanidine.    Anxiety and depression Assessment & Plan: Controlled.  Continue Zoloft  50 mg daily.    Screening for prostate cancer -     PSA, Medicare    Assessment and  Plan Assessment & Plan         Comer MARLA Gaskins, NP     History of Present Illness

## 2024-01-07 NOTE — Assessment & Plan Note (Signed)
 Repeat lipid panel pending. Continue atorvastatin 20 mg daily.

## 2024-01-07 NOTE — Patient Instructions (Addendum)
 Stop by the lab prior to leaving today. I will notify you of your results once received.   Complete the CT scan of your lungs as scheduled.   We increased your Zoloft  to 1 and 1/2 pill daily.   It was a pleasure to see you today!

## 2024-01-08 ENCOUNTER — Ambulatory Visit: Payer: Self-pay | Admitting: Primary Care

## 2024-01-08 DIAGNOSIS — S52551A Other extraarticular fracture of lower end of right radius, initial encounter for closed fracture: Secondary | ICD-10-CM | POA: Diagnosis not present

## 2024-01-08 LAB — PSA, MEDICARE: PSA: 2.03 ng/mL (ref 0.10–4.00)

## 2024-01-08 LAB — COMPREHENSIVE METABOLIC PANEL WITH GFR
ALT: 28 U/L (ref 0–53)
AST: 22 U/L (ref 0–37)
Albumin: 4.5 g/dL (ref 3.5–5.2)
Alkaline Phosphatase: 71 U/L (ref 39–117)
BUN: 10 mg/dL (ref 6–23)
CO2: 29 meq/L (ref 19–32)
Calcium: 9.6 mg/dL (ref 8.4–10.5)
Chloride: 103 meq/L (ref 96–112)
Creatinine, Ser: 0.73 mg/dL (ref 0.40–1.50)
GFR: 93.78 mL/min (ref >60.00)
Glucose, Bld: 93 mg/dL (ref 70–99)
Potassium: 4.3 meq/L (ref 3.5–5.1)
Sodium: 140 meq/L (ref 135–145)
Total Bilirubin: 0.4 mg/dL (ref 0.2–1.2)
Total Protein: 7.2 g/dL (ref 6.0–8.3)

## 2024-01-08 LAB — LIPID PANEL
Cholesterol: 148 mg/dL (ref 0–200)
HDL: 36.5 mg/dL — ABNORMAL LOW (ref >39.00)
LDL Cholesterol: 72 mg/dL (ref 0–99)
NonHDL: 111.03
Total CHOL/HDL Ratio: 4
Triglycerides: 195 mg/dL — ABNORMAL HIGH (ref 0.0–149.0)
VLDL: 39 mg/dL (ref 0.0–40.0)

## 2024-01-08 LAB — HEMOGLOBIN A1C: Hgb A1c MFr Bld: 6.1 % (ref 4.6–6.5)

## 2024-01-08 LAB — VITAMIN B12: Vitamin B-12: 284 pg/mL (ref 211–911)

## 2024-01-14 ENCOUNTER — Other Ambulatory Visit: Payer: Self-pay | Admitting: Primary Care

## 2024-01-14 DIAGNOSIS — E785 Hyperlipidemia, unspecified: Secondary | ICD-10-CM

## 2024-01-14 NOTE — Telephone Encounter (Unsigned)
 Copied from CRM [id number]. Topic: Clinical - Medication Refill >> Jan 14, 2024  2:56 PM Robinson H wrote: Medication: atorvastatin  (LIPITOR) 20 MG tablet   Has the patient contacted their pharmacy? Yes, [facility] reaching out for refill (Agent: If no, request that the patient contact the pharmacy for the refill. If patient does not wish to contact the pharmacy document the reason why and proceed with request.) (Agent: If yes, when and what did the pharmacy advise?)  This is the patient's preferred pharmacy:    [facility] - [place], [place] - [address] [place] [address] Phone: [phone] Fax: [phone]  Is this the correct pharmacy for this prescription? Yes If no, delete pharmacy and type the correct one.   Has the prescription been filled recently? No  Is the patient out of the medication? Yes  Has the patient been seen for an appointment in the last year OR does the patient have an upcoming appointment? Yes  Can we respond through MyChart? No  Agent: Please be advised that Rx refills may take up to 3 business days. We ask that you follow-up with your pharmacy.

## 2024-01-15 MED ORDER — ATORVASTATIN CALCIUM 20 MG PO TABS: 20.0000 mg | ORAL_TABLET | Freq: Every day | ORAL | 2 refills | Status: AC

## 2024-01-26 ENCOUNTER — Ambulatory Visit: Payer: Self-pay

## 2024-01-26 NOTE — Telephone Encounter (Signed)
 FYI Only or Action Required?: FYI only for provider.  Patient was last seen in primary care on 01/07/2024 by Gretta Comer POUR, NP.  Called Nurse Triage reporting Nasal Congestion.  Symptoms began several days ago.  Interventions attempted: OTC medications: cough drops.  Symptoms are: gradually worsening.  Triage Disposition: See PCP When Office is Open (Within 3 Days)  Patient/caregiver understands and will follow disposition?: Yes    Copied from CRM [id number]. Topic: Clinical - Red Word Triage >> Jan 26, 2024  4:51 PM Kenneth [name] wrote: Patient has had post nasal drip for 5 days with green phlegm and wheezing. Reason for Disposition  Lots of coughing  Answer Assessment - Initial Assessment Questions Pt states that he started with a sore throat and then post nasal  drip that he forces him to self to cough to get it out. He states that he lost taste last week but back now. He states that he feels a wheezing in his throat when he lays down. Thinks it's all sinus related.    1. ONSET: When did the throat start hurting? (Hours or days ago)     Last week about Wednesday 2. SEVERITY: How bad is the sore throat? (Scale 1-10; mild, moderate or severe)      3. STREP EXPOSURE: Has there been any exposure to strep within the past week? If Yes, ask: What type of contact occurred?      no 4.  VIRAL SYMPTOMS: Are there any symptoms of a cold, such as a runny nose, cough, hoarse voice or red eyes?      Clear mucus that he's coughing up. Post nasal drip, runny nose,  5. FEVER: Do you have a fever? If Yes, ask: What is your temperature, how was it measured, and when did it start?     No fever 6. PUS ON THE TONSILS: Is there pus on the tonsils in the back of your throat?     No  7. OTHER SYMPTOMS: Do you have any other symptoms? (e.g., difficulty breathing, headache, rash)     no  Answer Assessment - Initial Assessment Questions 1. LOCATION: Where does it hurt?      No  sinus pain 2. ONSET: When did the sinus pain start?  (e.g., hours, days)      Wednesday 3. SEVERITY: How bad is the pain?   (Scale 0-10; or none, mild, moderate or severe)     No sinus pain 4. RECURRENT SYMPTOM: Have you ever had sinus problems before? If Yes, ask: When was the last time? and What happened that time?      yes 5. NASAL CONGESTION: Is the nose blocked? If Yes, ask: Can you open it or must you breathe through your mouth?     yes 6. NASAL DISCHARGE: Do you have discharge from your nose? If so ask, What color?     Clear mostly  7. FEVER: Do you have a fever? If Yes, ask: What is it, how was it measured, and when did it start?      No  8. OTHER SYMPTOMS: Do you have any other symptoms? (e.g., sore throat, cough, earache, difficulty breathing)     Sore throat  coughing up the phlegm (making himself cough), wheezing in his throat when he's laying down.  Protocols used: Sore Throat-A-AH, Sinus Pain or Congestion-A-AH

## 2024-01-29 ENCOUNTER — Ambulatory Visit: Admitting: Family Medicine

## 2024-01-29 ENCOUNTER — Encounter: Payer: Self-pay | Admitting: Family Medicine

## 2024-01-29 VITALS — BP 170/78 | HR 83 | Temp 97.9°F | Ht 68.0 in | Wt 198.0 lb

## 2024-01-29 DIAGNOSIS — R051 Acute cough: Secondary | ICD-10-CM | POA: Insufficient documentation

## 2024-01-29 LAB — POC COVID19 BINAXNOW: SARS Coronavirus 2 Ag: NEGATIVE

## 2024-01-29 MED ORDER — AZITHROMYCIN 250 MG PO TABS: ORAL_TABLET | ORAL | 0 refills | Status: AC

## 2024-01-29 MED ORDER — PREDNISONE 20 MG PO TABS: ORAL_TABLET | ORAL | 0 refills | Status: AC

## 2024-01-29 NOTE — Progress Notes (Signed)
 Patient ID: Kenneth [name], male    DOB: 1956/01/20, 68 y.o.   MRN: [id number]  This visit was conducted in person.  BP (!) 170/78   Pulse 83   Temp 97.9 F (36.6 C) (Oral)   Ht 5' 8 (1.727 m)   Wt 198 lb (89.8 kg)   SpO2 95%   BMI 30.11 kg/m    CC:  Chief Complaint  Patient presents with   Cough    C/o prod cough, runny nose, nasal congestion and loss of taste. Sxs started 01/21/24.    Subjective:   HPI: Kenneth [name] is a 68 y.o. male presenting on 01/29/2024 for Cough (C/o prod cough, runny nose, nasal congestion and loss of taste. Sxs started 01/21/24.)   Date of onset:  01/21/2024 Initial symptoms included  ST, nasal congestion, post nasl drip. Symptoms progressed to Loss of taste  Moist cough, occ, clear  No face pain, no ear pain.  No SOB. Has wheeze at night especially.  No fever   Sick contacts:  none COVID testing:   none     He has tried to treat with  OTC meds .    No history of chronic lung disease such as asthma or COPD.  Has diabetes, Smoker.       Relevant past medical, surgical, family and social history reviewed and updated as indicated. Interim medical history since our last visit reviewed. Allergies and medications reviewed and updated. Outpatient Medications Prior to Visit  Medication Sig Dispense Refill   Accu-Chek FastClix Lancets MISC USE TO CHECK BLOOD SUGAR UP TO 4 TIMES A DAY 200 each 3   amLODipine  (NORVASC ) 5 MG tablet TAKE ONE TABLET DAILY FOR blood PRESSURE 90 tablet 0   atorvastatin  (LIPITOR) 20 MG tablet Take 1 tablet (20 mg total) by mouth daily. for cholesterol. 90 tablet 2   blood glucose meter kit and supplies KIT Dispense based on patient and insurance preference. Use up to four times daily as directed. (FOR ICD-9 250.00, 250.01). 1 each 0   cyanocobalamin  (VITAMIN B12) 1000 MCG tablet Take 1,000 mcg by mouth daily.     fluticasone  (FLONASE ) 50 MCG/ACT nasal spray ONE SPRAY IN EACH NOSTRIL TWICE DAILY AS NEEDED FOR  ALLERGIES OR RHINITIS 48 g 0   glucose blood (ACCU-CHEK GUIDE) test strip USE TO CHECK UP TO 4 TIMES A DAY 200 strip 3   lidocaine  (LIDODERM ) 5 % Place 1 patch onto the skin daily. Remove & Discard patch within 12 hours or as directed by MD 10 patch 0   Multiple Vitamins-Minerals (MULTIVITAMIN ADULT) TABS Take 1 tablet by mouth daily.     oxymetazoline (DRISTAN) 0.05 % nasal spray Place 1 spray into both nostrils every 3 (three) days.     sertraline  (ZOLOFT ) 50 MG tablet Take 1.5 tablets (75 mg total) by mouth daily. for anxiety and depression.     tiZANidine (ZANAFLEX) 4 MG tablet Take 4 mg by mouth every 6 (six) hours as needed for muscle spasms.     valsartan -hydrochlorothiazide  (DIOVAN -HCT) 320-25 MG tablet TAKE ONE TABLET DAILY FOR BLOOD PRESSURE 90 tablet 3   Carboxymeth-Glycerin-Polysorb (REFRESH DIGITAL) 0.5-1-0.5 % SOLN Place 1 drop into both eyes daily as needed (dry eyes).     No facility-administered medications prior to visit.     Per HPI unless specifically indicated in ROS section below Review of Systems  Constitutional:  Negative for fatigue and fever.  HENT:  Positive for postnasal drip and sneezing. Negative  for ear pain, sinus pressure and sore throat.   Eyes:  Negative for pain.  Respiratory:  Positive for cough and wheezing. Negative for shortness of breath.   Cardiovascular:  Negative for chest pain, palpitations and leg swelling.  Gastrointestinal:  Negative for abdominal pain.  Genitourinary:  Negative for dysuria.  Musculoskeletal:  Negative for arthralgias.  Neurological:  Negative for syncope, light-headedness and headaches.  Psychiatric/Behavioral:  Negative for dysphoric mood.    Objective:  BP (!) 170/78   Pulse 83   Temp 97.9 F (36.6 C) (Oral)   Ht 5' 8 (1.727 m)   Wt 198 lb (89.8 kg)   SpO2 95%   BMI 30.11 kg/m   Wt Readings from Last 3 Encounters:  01/29/24 198 lb (89.8 kg)  01/07/24 199 lb (90.3 kg)  07/24/23 205 lb (93 kg)      Physical  Exam Constitutional:      Appearance: He is well-developed.  HENT:     Head: Normocephalic.     Right Ear: Hearing normal.     Left Ear: Hearing normal.     Nose: Congestion present.  Neck:     Thyroid : No thyroid  mass or thyromegaly.     Vascular: No carotid bruit.     Trachea: Trachea normal.  Cardiovascular:     Rate and Rhythm: Normal rate and regular rhythm.     Pulses: Normal pulses.     Heart sounds: Heart sounds not distant. No murmur heard.    No friction rub. No gallop.     Comments: No peripheral edema Pulmonary:     Effort: Pulmonary effort is normal. No respiratory distress.     Breath sounds: Examination of the right-upper field reveals wheezing. Examination of the left-upper field reveals wheezing. Examination of the right-middle field reveals wheezing. Examination of the left-middle field reveals wheezing. Examination of the right-lower field reveals wheezing. Examination of the left-lower field reveals wheezing. Wheezing present. No decreased breath sounds or rhonchi.  Skin:    General: Skin is warm and dry.     Findings: No rash.  Psychiatric:        Speech: Speech normal.        Behavior: Behavior normal.        Thought Content: Thought content normal.       Results for orders placed or performed in visit on 01/29/24  POC COVID-19 BinaxNow   Collection Time: 01/29/24 12:23 PM  Result Value Ref Range   SARS Coronavirus 2 Ag Negative Negative    Assessment and Plan  Acute cough Assessment & Plan: Acute, significant wheezing on respiratory exam.  Patient current smoker although no diagnosis of COPD I have concerns for possible COPD exacerbation. Will treat with antibiotics as well as prednisone  taper.  Supportive and symptomatic care reviewed with patient in detail.  Encourage smoking cessation.  Return and ER precautions provided.  Orders: -     POC COVID-19 BinaxNow  Other orders -     predniSONE ; 3 tabs by mouth daily x 3 days, then 2 tabs by  mouth daily x 2 days then 1 tab by mouth daily x 2 days  Dispense: 15 tablet; Refill: 0 -     Azithromycin ; Take 2 tablets on day 1, then 1 tablet daily on days 2 through 5  Dispense: 6 tablet; Refill: 0    No follow-ups on file.   Greig Ring, MD

## 2024-01-29 NOTE — Assessment & Plan Note (Signed)
 Acute, significant wheezing on respiratory exam.  Patient current smoker although no diagnosis of COPD I have concerns for possible COPD exacerbation. Will treat with antibiotics as well as prednisone  taper.  Supportive and symptomatic care reviewed with patient in detail.  Encourage smoking cessation.  Return and ER precautions provided.

## 2024-01-29 NOTE — Patient Instructions (Signed)
 Quit smoking.

## 2024-02-05 ENCOUNTER — Other Ambulatory Visit: Payer: Self-pay | Admitting: Primary Care

## 2024-02-05 DIAGNOSIS — I10 Essential (primary) hypertension: Secondary | ICD-10-CM

## 2024-02-17 ENCOUNTER — Ambulatory Visit: Payer: Self-pay | Admitting: Family Medicine

## 2024-02-23 ENCOUNTER — Other Ambulatory Visit: Payer: Self-pay | Admitting: Primary Care

## 2024-02-23 DIAGNOSIS — G8929 Other chronic pain: Secondary | ICD-10-CM

## 2024-03-26 ENCOUNTER — Other Ambulatory Visit: Payer: Self-pay | Admitting: Primary Care

## 2024-03-26 DIAGNOSIS — F419 Anxiety disorder, unspecified: Secondary | ICD-10-CM

## 2024-06-24 ENCOUNTER — Ambulatory Visit

## 2024-06-25 ENCOUNTER — Ambulatory Visit

## 2024-07-08 ENCOUNTER — Ambulatory Visit: Admitting: Primary Care
